# Patient Record
Sex: Female | Born: 1990 | Race: White | Hispanic: No | Marital: Single | Smoking: Never smoker
Health system: Southern US, Community
[De-identification: ages and names within clinical notes are randomized; demographics above are authoritative.]

## PROBLEM LIST (undated history)

## (undated) DIAGNOSIS — N83209 Unspecified ovarian cyst, unspecified side: Secondary | ICD-10-CM

## (undated) DIAGNOSIS — E039 Hypothyroidism, unspecified: Secondary | ICD-10-CM

## (undated) DIAGNOSIS — E119 Type 2 diabetes mellitus without complications: Secondary | ICD-10-CM

## (undated) DIAGNOSIS — I10 Essential (primary) hypertension: Secondary | ICD-10-CM

## (undated) HISTORY — PX: LAPAROSCOPIC OVARIAN CYSTECTOMY: SUR786

## (undated) HISTORY — DX: Essential (primary) hypertension: I10

## (undated) HISTORY — DX: Unspecified ovarian cyst, unspecified side: N83.209

## (undated) HISTORY — DX: Type 2 diabetes mellitus without complications: E11.9

## (undated) HISTORY — DX: Hypothyroidism, unspecified: E03.9

---
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
## 2015-08-19 ENCOUNTER — Encounter: Payer: Self-pay | Admitting: Family Medicine

## 2015-08-19 ENCOUNTER — Ambulatory Visit (INDEPENDENT_AMBULATORY_CARE_PROVIDER_SITE_OTHER): Payer: BLUE CROSS/BLUE SHIELD | Admitting: Family Medicine

## 2015-08-19 VITALS — BP 133/87 | HR 70 | Temp 98.2°F | Ht 64.0 in | Wt 280.6 lb

## 2015-08-19 DIAGNOSIS — R319 Hematuria, unspecified: Secondary | ICD-10-CM

## 2015-08-19 DIAGNOSIS — N926 Irregular menstruation, unspecified: Secondary | ICD-10-CM | POA: Diagnosis not present

## 2015-08-19 DIAGNOSIS — E119 Type 2 diabetes mellitus without complications: Secondary | ICD-10-CM | POA: Diagnosis not present

## 2015-08-19 DIAGNOSIS — N83209 Unspecified ovarian cyst, unspecified side: Secondary | ICD-10-CM | POA: Insufficient documentation

## 2015-08-19 DIAGNOSIS — I10 Essential (primary) hypertension: Secondary | ICD-10-CM | POA: Diagnosis not present

## 2015-08-19 DIAGNOSIS — E1122 Type 2 diabetes mellitus with diabetic chronic kidney disease: Secondary | ICD-10-CM

## 2015-08-19 DIAGNOSIS — I129 Hypertensive chronic kidney disease with stage 1 through stage 4 chronic kidney disease, or unspecified chronic kidney disease: Secondary | ICD-10-CM | POA: Insufficient documentation

## 2015-08-19 DIAGNOSIS — Z13220 Encounter for screening for lipoid disorders: Secondary | ICD-10-CM | POA: Diagnosis not present

## 2015-08-19 MED ORDER — METFORMIN HCL ER (MOD) 500 MG PO TB24: ORAL_TABLET | ORAL | Status: DC

## 2015-08-19 NOTE — Assessment & Plan Note (Signed)
Sounds like PCOS from her description. Will obtain records from previous provider. Will get her into GYN given her abnormal menstrual cycles and previous need for surgery. Will likely need OCP. Referral to GYN made today.

## 2015-08-19 NOTE — Patient Instructions (Signed)
Diabetes and Exercise Exercising regularly is important. It is not just about losing weight. It has many health benefits, such as:  Improving your overall fitness, flexibility, and endurance.  Increasing your bone density.  Helping with weight control.  Decreasing your body fat.  Increasing your muscle strength.  Reducing stress and tension.  Improving your overall health. People with diabetes who exercise gain additional benefits because exercise:  Reduces appetite.  Improves the body's use of blood sugar (glucose).  Helps lower or control blood glucose.  Decreases blood pressure.  Helps control blood lipids (such as cholesterol and triglycerides).  Improves the body's use of the hormone insulin by:  Increasing the body's insulin sensitivity.  Reducing the body's insulin needs.  Decreases the risk for heart disease because exercising:  Lowers cholesterol and triglycerides levels.  Increases the levels of good cholesterol (such as high-density lipoproteins [HDL]) in the body.  Lowers blood glucose levels. YOUR ACTIVITY PLAN  Choose an activity that you enjoy, and set realistic goals. To exercise safely, you should begin practicing any new physical activity slowly, and gradually increase the intensity of the exercise over time. Your health care provider or diabetes educator can help create an activity plan that works for you. General recommendations include:  Encouraging children to engage in at least 60 minutes of physical activity each day.  Stretching and performing strength training exercises, such as yoga or weight lifting, at least 2 times per week.  Performing a total of at least 150 minutes of moderate-intensity exercise each week, such as brisk walking or water aerobics.  Exercising at least 3 days per week, making sure you allow no more than 2 consecutive days to pass without exercising.  Avoiding long periods of inactivity (90 minutes or more). When you  have to spend an extended period of time sitting down, take frequent breaks to walk or stretch. RECOMMENDATIONS FOR EXERCISING WITH TYPE 1 OR TYPE 2 DIABETES   Check your blood glucose before exercising. If blood glucose levels are greater than 240 mg/dL, check for urine ketones. Do not exercise if ketones are present.  Avoid injecting insulin into areas of the body that are going to be exercised. For example, avoid injecting insulin into:  The arms when playing tennis.  The legs when jogging.  Keep a record of:  Food intake before and after you exercise.  Expected peak times of insulin action.  Blood glucose levels before and after you exercise.  The type and amount of exercise you have done.  Review your records with your health care provider. Your health care provider will help you to develop guidelines for adjusting food intake and insulin amounts before and after exercising.  If you take insulin or oral hypoglycemic agents, watch for signs and symptoms of hypoglycemia. They include:  Dizziness.  Shaking.  Sweating.  Chills.  Confusion.  Drink plenty of water while you exercise to prevent dehydration or heat stroke. Body water is lost during exercise and must be replaced.  Talk to your health care provider before starting an exercise program to make sure it is safe for you. Remember, almost any type of activity is better than none.   This information is not intended to replace advice given to you by your health care provider. Make sure you discuss any questions you have with your health care provider.   Document Released: 09/18/2003 Document Revised: 11/12/2014 Document Reviewed: 12/05/2012 Elsevier Interactive Patient Education 2016 [employer] Diabetes Mellitus and Food It is important for   you to manage your blood sugar (glucose) level. Your blood glucose level can be greatly affected by what you eat. Eating healthier foods in the appropriate amounts throughout  the day at about the same time each day will help you control your blood glucose level. It can also help slow or prevent worsening of your diabetes mellitus. Healthy eating may even help you improve the level of your blood pressure and reach or maintain a healthy weight.  General recommendations for healthful eating and cooking habits include:  Eating meals and snacks regularly. Avoid going long periods of time without eating to lose weight.  Eating a diet that consists mainly of plant-based foods, such as fruits, vegetables, nuts, legumes, and whole grains.  Using low-heat cooking methods, such as baking, instead of high-heat cooking methods, such as deep frying. Work with your dietitian to make sure you understand how to use the Nutrition Facts information on food labels. HOW CAN FOOD AFFECT ME? Carbohydrates Carbohydrates affect your blood glucose level more than any other type of food. Your dietitian will help you determine how many carbohydrates to eat at each meal and teach you how to count carbohydrates. Counting carbohydrates is important to keep your blood glucose at a healthy level, especially if you are using insulin or taking certain medicines for diabetes mellitus. Alcohol Alcohol can cause sudden decreases in blood glucose (hypoglycemia), especially if you use insulin or take certain medicines for diabetes mellitus. Hypoglycemia can be a life-threatening condition. Symptoms of hypoglycemia (sleepiness, dizziness, and disorientation) are similar to symptoms of having too much alcohol.  If your health care provider has given you approval to drink alcohol, do so in moderation and use the following guidelines:  Women should not have more than one drink per day, and men should not have more than two drinks per day. One drink is equal to:  12 oz of beer.  5 oz of wine.  1 oz of hard liquor.  Do not drink on an empty stomach.  Keep yourself hydrated. Have water, diet soda, or  unsweetened iced tea.  Regular soda, juice, and other mixers might contain a lot of carbohydrates and should be counted. WHAT FOODS ARE NOT RECOMMENDED? As you make food choices, it is important to remember that all foods are not the same. Some foods have fewer nutrients per serving than other foods, even though they might have the same number of calories or carbohydrates. It is difficult to get your body what it needs when you eat foods with fewer nutrients. Examples of foods that you should avoid that are high in calories and carbohydrates but low in nutrients include:  Trans fats (most processed foods list trans fats on the Nutrition Facts label).  Regular soda.  Juice.  Candy.  Sweets, such as cake, pie, doughnuts, and cookies.  Fried foods. WHAT FOODS CAN I EAT? Eat nutrient-rich foods, which will nourish your body and keep you healthy. The food you should eat also will depend on several factors, including:  The calories you need.  The medicines you take.  Your weight.  Your blood glucose level.  Your blood pressure level.  Your cholesterol level. You should eat a variety of foods, including:  Protein.  Lean cuts of meat.  Proteins low in saturated fats, such as fish, egg whites, and beans. Avoid processed meats.  Fruits and vegetables.  Fruits and vegetables that may help control blood glucose levels, such as apples, mangoes, and yams.  Dairy products.  Choose fat-free   or low-fat dairy products, such as milk, yogurt, and cheese.  Grains, bread, pasta, and rice.  Choose whole grain products, such as multigrain bread, whole oats, and brown rice. These foods may help control blood pressure.  Fats.  Foods containing healthful fats, such as nuts, avocado, olive oil, canola oil, and fish. DOES EVERYONE WITH DIABETES MELLITUS HAVE THE SAME MEAL PLAN? Because every person with diabetes mellitus is different, there is not one meal plan that works for everyone. It  is very important that you meet with a dietitian who will help you create a meal plan that is just right for you.   This information is not intended to replace advice given to you by your health care provider. Make sure you discuss any questions you have with your health care provider.   Document Released: 03/25/2005 Document Revised: 07/19/2014 Document Reviewed: 05/25/2013 Elsevier Interactive Patient Education 2016 Elsevier Inc.  

## 2015-08-19 NOTE — Assessment & Plan Note (Signed)
Will get her into GYN given her abnormal menstrual cycles and previous need for surgery. Will likely need OCP. Referral to GYN made today.

## 2015-08-19 NOTE — Assessment & Plan Note (Signed)
Normal BP today. Continue to monitor. Continue to work on diet and exercise. Recheck in 1 month at physical.

## 2015-08-19 NOTE — Assessment & Plan Note (Signed)
Has lost weight, but doesn't know how much. Continue to watch diet and work on exercise. Referral to lifestyle center made today.

## 2015-08-19 NOTE — Progress Notes (Signed)
BP 133/87 mmHg  Pulse 70  Temp(Src) 98.2 F (36.8 C)  Ht  (1.626 m)  Wt 280 lb 9.6 oz (127.279 kg)  BMI 48.14 kg/m2  SpO2 99%  LMP  (LMP Unknown)   Subjective:    Patient ID: Maria Reyes, female    DOB: 11/25/1990, 25 y.o.   MRN: [id number]  HPI: Maria Reyes is a 25 y.o. female was seeing Elnita Maxwell in the past. Had been seeing GYN.   Chief Complaint  Patient presents with  . Establish Care  . Menstrual Problem    Extremely Irregular Menstural Cycles  . Foot Swelling    Left foot x's two months  . Medication Problem    Patient was tested for diabetes and was borderline. Also had high blood pressure. Was put on medication but stopped both. Couldn't afford medication and had no health insurance.   . Other    Hasn't had papsmear in two years.    DIABETES- first diagnosed about 2-3 years ago, didn't do any classes. Was on medication, but didn't take it.  Hypoglycemic episodes:yes Polydipsia/polyuria: yes Visual disturbance: no Chest pain: no Paresthesias: yes- occasionally in her feet Glucose Monitoring: no  Accucheck frequency: 4-5 months ago.   Fasting glucose: 60-80 Taking Insulin?: no Blood Pressure Monitoring: daily Retinal Examination: Not up to Date Foot Exam: Not up to Date Diabetic Education: Not Completed Pneumovax: Not up to Date Influenza: Up to Date Aspirin: no  HYPERTENSION- has been working on losing weight Hypertension status: better  Satisfied with current treatment? no Duration of hypertension: chronic BP monitoring frequency:  daily BP range: 120s-130s Previous BP meds: Aspirin: no Recurrent headaches: no Visual changes: no Palpitations: no Dyspnea: no Chest pain: no Lower extremity edema: yes Dizzy/lightheaded: no  ABNORMAL MENSTRUAL PERIODS- will bleed for about a day, then will go 4-5 months with dark old blood, leaving odor. Not sure if she has been diagnosed with PCOS in the past. Was on OCPs in the past for the cysts.   Duration:  about a year Average interval between menses: up to 7 months to a year Length of menses: 4-77months Flow: goes back and forth between heavy and spotting Dysmenorrhea: yes Intermenstrual bleeding:yes Postcoital bleeding: yes Contraception: none Menarche at age: 64 Sexual activity: In a Monogamous Relationship History of sexually transmitted diseases: no History GYN procedures: yes, laproscopy for ovarian cysts and endometriosis Abnormal pap smears: no   Dyspareunia: no Vaginal discharge:no Abdominal pain: yes Galactorrhea: no Hirsuitism: yes Frequent bruising/mucosal bleeding: no Double vision:no Hot flashes: yes  L foot has been swelling off and on for months or so  Active Ambulatory Problems    Diagnosis Date Noted  . DM (diabetes mellitus), type 2 with renal complications (HCC)   . Hypertension   . Ovarian cyst   . Morbid obesity (HCC) 08/19/2015  . Abnormal menstrual cycle 08/19/2015   Resolved Ambulatory Problems    Diagnosis Date Noted  . No Resolved Ambulatory Problems   Past Medical History  Diagnosis Date  . Diabetes mellitus without complication [facility])    Past Surgical History  Procedure Laterality Date  . Laparoscopic ovarian cystectomy     No Known Allergies  Family History  Problem Relation Age of Onset  . Endometriosis Mother   . Ovarian cysts Mother   . Cancer Mother     Breast and Ovarian  . Diabetes Maternal Grandmother   . Hypertension Maternal Grandmother   . Diabetes Maternal Grandfather    Social History  Social History  . Marital Status: Single    Spouse Name: N/A  . Number of Children: N/A  . Years of Education: N/A   Social History Main Topics  . Smoking status: Never Smoker   . Smokeless tobacco: Never Used  . Alcohol Use: Yes     Comment: Occasionally  . Drug Use: No  . Sexual Activity: Yes   Other Topics Concern  . None   Social History Narrative  . None   Review of Systems  Constitutional: Negative.   HENT:  Negative.   Respiratory: Negative.   Cardiovascular: Negative.   Musculoskeletal: Negative.   Psychiatric/Behavioral: Negative.     Per HPI unless specifically indicated above     Objective:    BP 133/87 mmHg  Pulse 70  Temp(Src) 98.2 F (36.8 C)  Ht 5\' 4"  (1.626 m)  Wt 280 lb 9.6 oz (127.279 kg)  BMI 48.14 kg/m2  SpO2 99%  LMP  (LMP Unknown)  Wt Readings from Last 3 Encounters:  08/19/15 280 lb 9.6 oz (127.279 kg)    Physical Exam  Constitutional: She is oriented to person, place, and time. She appears well-developed and well-nourished. No distress.  HENT:  Head: Normocephalic and atraumatic.  Right Ear: Hearing and external ear normal.  Left Ear: Hearing and external ear normal.  Nose: Nose normal.  Mouth/Throat: Oropharynx is clear and moist. No oropharyngeal exudate.  Eyes: Conjunctivae, EOM and lids are normal. Pupils are equal, round, and reactive to light. Right eye exhibits no discharge. Left eye exhibits no discharge. No scleral icterus.  Neck: Normal range of motion. Neck supple. No JVD present. No tracheal deviation present. No thyromegaly present.  Cardiovascular: Normal rate, regular rhythm, normal heart sounds and intact distal pulses.  Exam reveals no gallop and no friction rub.   No murmur heard. Pulmonary/Chest: Effort normal and breath sounds normal. No stridor. No respiratory distress. She has no wheezes. She has no rales. She exhibits no tenderness.  Musculoskeletal: Normal range of motion.  Lymphadenopathy:    She has no cervical adenopathy.  Neurological: She is alert and oriented to person, place, and time.  Skin: Skin is warm, dry and intact. No rash noted. She is not diaphoretic. No erythema. No pallor.  Psychiatric: She has a normal mood and affect. Her speech is normal and behavior is normal. Judgment and thought content normal. Cognition and memory are normal.  Nursing note and vitals reviewed.      Assessment & Plan:   Problem List Items  Addressed This Visit      Cardiovascular and Mediastinum   Hypertension    Normal BP today. Continue to monitor. Continue to work on diet and exercise. Recheck in 1 month at physical.       Relevant Orders   CBC with Differential/Platelet   Comprehensive metabolic panel   Microalbumin, Urine Waived (Completed)   TSH   UA/M w/rflx Culture, Routine (Completed)     Endocrine   DM (diabetes mellitus), type 2 with renal complications (HCC) - Primary    Not under good control. Has been off medications. A1c 8.9 today. Will start metformin and check in in 1 month. Work on diet and exercise. Continue to lose weight. Rx for glucometer given today. Referral to eye doctor and life style center made today. Continue to monitor closely.      Relevant Medications   metformin (GLUMETZA) 500 MG (MOD) 24 hr tablet     Genitourinary   Ovarian cyst  Sounds like PCOS from her description. Will obtain records from previous provider. Will get her into GYN given her abnormal menstrual cycles and previous need for surgery. Will likely need OCP. Referral to GYN made today.        Other   Morbid obesity (HCC)    Has lost weight, but doesn't know how much. Continue to watch diet and work on exercise. Referral to lifestyle center made today.       Relevant Medications   metformin (GLUMETZA) 500 MG (MOD) 24 hr tablet   Other Relevant Orders   CBC with Differential/Platelet   Comprehensive metabolic panel   TSH   UA/M w/rflx Culture, Routine (Completed)   Abnormal menstrual cycle    Will get her into GYN given her abnormal menstrual cycles and previous need for surgery. Will likely need OCP. Referral to GYN made today.      Relevant Orders   Ambulatory referral to Gynecology    Other Visit Diagnoses    Screening for cholesterol level        Drawn today. Await results. Treat as needed.     Relevant Orders    Lipid Panel w/o Chol/HDL Ratio    UA/M w/rflx Culture, Routine (Completed)         Follow up plan: Return in about 4 weeks (around 09/16/2015) for follow up on DM and BP/ physical.

## 2015-08-19 NOTE — Assessment & Plan Note (Signed)
Not under good control. Has been off medications. A1c 8.9 today. Will start metformin and check in in 1 month. Work on diet and exercise. Continue to lose weight. Rx for glucometer given today. Referral to eye doctor and life style center made today. Continue to monitor closely.

## 2015-08-20 ENCOUNTER — Encounter: Payer: Self-pay | Admitting: Family Medicine

## 2015-08-20 ENCOUNTER — Telehealth: Payer: Self-pay | Admitting: Family Medicine

## 2015-08-20 DIAGNOSIS — E039 Hypothyroidism, unspecified: Secondary | ICD-10-CM | POA: Insufficient documentation

## 2015-08-20 DIAGNOSIS — R7989 Other specified abnormal findings of blood chemistry: Secondary | ICD-10-CM

## 2015-08-20 LAB — CBC WITH DIFFERENTIAL/PLATELET
BASOS ABS: 0 10*3/uL (ref 0.0–0.2)
Basos: 0 %
EOS (ABSOLUTE): 0.1 10*3/uL (ref 0.0–0.4)
Eos: 1 %
Hematocrit: 42.1 % (ref 34.0–46.6)
Hemoglobin: 13.2 g/dL (ref 11.1–15.9)
Immature Grans (Abs): 0 10*3/uL (ref 0.0–0.1)
Immature Granulocytes: 0 %
Lymphocytes Absolute: 3.7 10*3/uL — ABNORMAL HIGH (ref 0.7–3.1)
Lymphs: 35 %
MCH: 25.2 pg — AB (ref 26.6–33.0)
MCHC: 31.4 g/dL — AB (ref 31.5–35.7)
MCV: 80 fL (ref 79–97)
MONOS ABS: 0.4 10*3/uL (ref 0.1–0.9)
Monocytes: 4 %
NEUTROS ABS: 6.4 10*3/uL (ref 1.4–7.0)
Neutrophils: 60 %
PLATELETS: 346 10*3/uL (ref 150–379)
RBC: 5.24 x10E6/uL (ref 3.77–5.28)
RDW: 14.5 % (ref 12.3–15.4)
WBC: 10.6 10*3/uL (ref 3.4–10.8)

## 2015-08-20 LAB — COMPREHENSIVE METABOLIC PANEL
A/G RATIO: 1.4 (ref 1.1–2.5)
ALT: 46 IU/L — ABNORMAL HIGH (ref 0–32)
AST: 25 IU/L (ref 0–40)
Albumin: 4.3 g/dL (ref 3.5–5.5)
Alkaline Phosphatase: 104 IU/L (ref 39–117)
BUN/Creatinine Ratio: 18 (ref 8–20)
BUN: 9 mg/dL (ref 6–20)
Bilirubin Total: 0.2 mg/dL (ref 0.0–1.2)
CO2: 21 mmol/L (ref 18–29)
Calcium: 9.6 mg/dL (ref 8.7–10.2)
Chloride: 99 mmol/L (ref 96–106)
Creatinine, Ser: 0.51 mg/dL — ABNORMAL LOW (ref 0.57–1.00)
GFR calc non Af Amer: 137 mL/min/{1.73_m2} (ref >59)
GFR, EST AFRICAN AMERICAN: 158 mL/min/{1.73_m2} (ref >59)
Globulin, Total: 3 g/dL (ref 1.5–4.5)
Glucose: 162 mg/dL — ABNORMAL HIGH (ref 65–99)
POTASSIUM: 4.5 mmol/L (ref 3.5–5.2)
Sodium: 139 mmol/L (ref 134–144)
TOTAL PROTEIN: 7.3 g/dL (ref 6.0–8.5)

## 2015-08-20 LAB — LIPID PANEL W/O CHOL/HDL RATIO
Cholesterol, Total: 176 mg/dL (ref 100–199)
HDL: 40 mg/dL (ref >39)
LDL Calculated: 114 mg/dL — ABNORMAL HIGH (ref 0–99)
Triglycerides: 112 mg/dL (ref 0–149)
VLDL Cholesterol Cal: 22 mg/dL (ref 5–40)

## 2015-08-20 LAB — TSH: TSH: 5.01 u[IU]/mL — ABNORMAL HIGH (ref 0.450–4.500)

## 2015-08-20 NOTE — Telephone Encounter (Signed)
Called patient to let her know that her thyroid was off slightly but that the rest of her labs look good. Will recheck thyroid in 1 month. Letter generated also telling her this.

## 2015-08-21 ENCOUNTER — Telehealth: Payer: Self-pay | Admitting: Family Medicine

## 2015-08-21 MED ORDER — CIPROFLOXACIN HCL 500 MG PO TABS: 500.0000 mg | ORAL_TABLET | Freq: Two times a day (BID) | ORAL | Status: DC

## 2015-08-21 NOTE — Telephone Encounter (Signed)
Patient returned my call, let her know medication was sent to the pharmacy.

## 2015-08-21 NOTE — Telephone Encounter (Signed)
An you please let her know her urine grew out bacteria and I've sent over an antibiotic for her? Thanks!

## 2015-08-21 NOTE — Telephone Encounter (Signed)
Left message for patient to return my call.

## 2015-08-25 LAB — MICROSCOPIC EXAMINATION

## 2015-08-25 LAB — UA/M W/RFLX CULTURE, ROUTINE
BILIRUBIN UA: NEGATIVE
GLUCOSE, UA: NEGATIVE
Ketones, UA: NEGATIVE
Nitrite, UA: NEGATIVE
Protein, UA: NEGATIVE
Specific Gravity, UA: 1.025 (ref 1.005–1.030)
Urobilinogen, Ur: 0.2 mg/dL (ref 0.2–1.0)
pH, UA: 5.5 (ref 5.0–7.5)

## 2015-08-25 LAB — URINE CULTURE, REFLEX

## 2015-08-25 LAB — MICROALBUMIN, URINE WAIVED
CREATININE, URINE WAIVED: 200 mg/dL (ref 10–300)
MICROALB, UR WAIVED: 80 mg/L — AB (ref 0–19)

## 2015-08-25 LAB — BAYER DCA HB A1C WAIVED: HB A1C (BAYER DCA - WAIVED): 8.9 % — ABNORMAL HIGH (ref <7.0)

## 2015-08-26 ENCOUNTER — Telehealth: Payer: Self-pay

## 2015-08-26 NOTE — Telephone Encounter (Signed)
Patients prior authroization for MetFormin was denied.   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

## 2015-08-26 NOTE — Telephone Encounter (Signed)
Called pharmacy fixed the prescription

## 2015-08-26 NOTE — Telephone Encounter (Signed)
OK to call in glucophage XR into her pharmacy. Doesn't need glumetza. Any extended release metformin is fine. Whatever they'll cover.

## 2015-08-29 ENCOUNTER — Ambulatory Visit: Payer: Self-pay | Admitting: *Deleted

## 2015-09-19 ENCOUNTER — Encounter: Payer: BLUE CROSS/BLUE SHIELD | Admitting: Family Medicine

## 2015-09-24 ENCOUNTER — Encounter: Payer: BLUE CROSS/BLUE SHIELD | Admitting: Obstetrics and Gynecology

## 2015-10-02 ENCOUNTER — Ambulatory Visit (INDEPENDENT_AMBULATORY_CARE_PROVIDER_SITE_OTHER): Payer: BLUE CROSS/BLUE SHIELD

## 2015-10-02 ENCOUNTER — Ambulatory Visit (INDEPENDENT_AMBULATORY_CARE_PROVIDER_SITE_OTHER): Payer: BLUE CROSS/BLUE SHIELD | Admitting: Family Medicine

## 2015-10-02 ENCOUNTER — Ambulatory Visit (INDEPENDENT_AMBULATORY_CARE_PROVIDER_SITE_OTHER): Payer: BLUE CROSS/BLUE SHIELD | Admitting: Obstetrics and Gynecology

## 2015-10-02 ENCOUNTER — Encounter: Payer: Self-pay | Admitting: Obstetrics and Gynecology

## 2015-10-02 ENCOUNTER — Encounter: Payer: Self-pay | Admitting: Family Medicine

## 2015-10-02 VITALS — BP 135/80 | HR 92 | Ht 64.0 in | Wt 281.0 lb

## 2015-10-02 VITALS — BP 126/76 | HR 67 | Temp 98.1°F | Ht 63.5 in | Wt 278.0 lb

## 2015-10-02 DIAGNOSIS — R7989 Other specified abnormal findings of blood chemistry: Secondary | ICD-10-CM

## 2015-10-02 DIAGNOSIS — E1122 Type 2 diabetes mellitus with diabetic chronic kidney disease: Secondary | ICD-10-CM | POA: Diagnosis not present

## 2015-10-02 DIAGNOSIS — N83201 Unspecified ovarian cyst, right side: Secondary | ICD-10-CM | POA: Diagnosis not present

## 2015-10-02 DIAGNOSIS — N926 Irregular menstruation, unspecified: Secondary | ICD-10-CM

## 2015-10-02 DIAGNOSIS — F32A Depression, unspecified: Secondary | ICD-10-CM | POA: Insufficient documentation

## 2015-10-02 DIAGNOSIS — Z113 Encounter for screening for infections with a predominantly sexual mode of transmission: Secondary | ICD-10-CM | POA: Diagnosis not present

## 2015-10-02 DIAGNOSIS — Z23 Encounter for immunization: Secondary | ICD-10-CM

## 2015-10-02 DIAGNOSIS — R102 Pelvic and perineal pain: Secondary | ICD-10-CM | POA: Diagnosis not present

## 2015-10-02 DIAGNOSIS — E119 Type 2 diabetes mellitus without complications: Secondary | ICD-10-CM | POA: Diagnosis not present

## 2015-10-02 DIAGNOSIS — N83202 Unspecified ovarian cyst, left side: Secondary | ICD-10-CM

## 2015-10-02 DIAGNOSIS — F329 Major depressive disorder, single episode, unspecified: Secondary | ICD-10-CM | POA: Diagnosis not present

## 2015-10-02 DIAGNOSIS — Z0000 Encounter for general adult medical examination without abnormal findings: Secondary | ICD-10-CM | POA: Diagnosis not present

## 2015-10-02 LAB — GLUCOSE HEMOCUE WAIVED: Glu Hemocue Waived: 155 mg/dL — ABNORMAL HIGH (ref 65–99)

## 2015-10-02 MED ORDER — IBUPROFEN 800 MG PO TABS: 800.0000 mg | ORAL_TABLET | Freq: Three times a day (TID) | ORAL | Status: DC | PRN

## 2015-10-02 MED ORDER — MEDROXYPROGESTERONE ACETATE 10 MG PO TABS: 10.0000 mg | ORAL_TABLET | Freq: Every day | ORAL | Status: DC

## 2015-10-02 NOTE — Assessment & Plan Note (Signed)
Not interested in treatment at this time. Will continue to monitor and recheck next visit. Call with any concerns or with changes.

## 2015-10-02 NOTE — Progress Notes (Signed)
Subjective:     Patient ID: Maria Reyes, female   DOB: [birth year], 25 y.o.   MRN: [id number]  HPI Referred here for evaluation of irregular menses and pelvic pain. Reports irregular menses with occasionally skipping months over last year, spotting of brown blood for days to week, progressive weight gain, and lower pelvic pain intermittent. States onset menarche at age 89 with regular menses till age 25 at which time she had large ovarian cyst- surgically removed, and then took OCPs for 4 months, stopped due to elevated blood pressure. Was diagnosed with diabetes and started on metformin. 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 Is engaged and sexually active using condoms.   Review of Systems See above    Objective:   Physical Exam A&O x4  well groomed obese female in no distress Blood pressure 135/80, pulse 92, height 5\' 4"  (1.626 m), weight 281 lb (127.461 kg), last menstrual period 06/12/2015.  Ultrasound done today revealed ruptured ovarian cyst on right ovary and small cyst on left ovary, both ovaries slightly enlarged with multiple follicle and 'string of pearls' appearance. Uterus normal in size with thickened endometrium at 9mm. Scant free fluid noted in cul de sac.    Assessment:     Ovarian cyst Irregular menses Pelvic pain PCOS Morbid obesity     Plan:     Counseled on findings. Prescription given for provera challenge to start now. Labs ordered to r/o PCOS. RTC in 2 weeks to discuss labs and treatment.  Rafik Koppel EmbarrassShambley, CNM

## 2015-10-02 NOTE — Assessment & Plan Note (Signed)
Doing better on metformin, tolerating it well. Recheck A1c in 2 months.

## 2015-10-02 NOTE — Patient Instructions (Signed)
Health Maintenance, Female Adopting a healthy lifestyle and getting preventive care can go a long way to promote health and wellness. Talk with your health care provider about what schedule of regular examinations is right for you. This is a good chance for you to check in with your provider about disease prevention and staying healthy. In between checkups, there are plenty of things you can do on your own. Experts have done a lot of research about which lifestyle changes and preventive measures are most likely to keep you healthy. Ask your health care provider for more information. WEIGHT AND DIET  Eat a healthy diet  Be sure to include plenty of vegetables, fruits, low-fat dairy products, and lean protein.  Do not eat a lot of foods high in solid fats, added sugars, or salt.  Get regular exercise. This is one of the most important things you can do for your health.  Most adults should exercise for at least 150 minutes each week. The exercise should increase your heart rate and make you sweat (moderate-intensity exercise).  Most adults should also do strengthening exercises at least twice a week. This is in addition to the moderate-intensity exercise.  Maintain a healthy weight  Body mass index (BMI) is a measurement that can be used to identify possible weight problems. It estimates body fat based on height and weight. Your health care provider can help determine your BMI and help you achieve or maintain a healthy weight.  For females 20 years of age and older:   A BMI below 18.5 is considered underweight.  A BMI of 18.5 to 24.9 is normal.  A BMI of 25 to 29.9 is considered overweight.  A BMI of 30 and above is considered obese.  Watch levels of cholesterol and blood lipids  You should start having your blood tested for lipids and cholesterol at 23 years of age, then have this test every 5 years.  You may need to have your cholesterol levels checked more often if:  Your lipid  or cholesterol levels are high.  You are older than 23 years of age.  You are at high risk for heart disease.  CANCER SCREENING   Lung Cancer  Lung cancer screening is recommended for adults 55-80 years old who are at high risk for lung cancer because of a history of smoking.  A yearly low-dose CT scan of the lungs is recommended for people who:  Currently smoke.  Have quit within the past 15 years.  Have at least a 30-pack-year history of smoking. A pack year is smoking an average of one pack of cigarettes a day for 1 year.  Yearly screening should continue until it has been 15 years since you quit.  Yearly screening should stop if you develop a health problem that would prevent you from having lung cancer treatment.  Breast Cancer  Practice breast self-awareness. This means understanding how your breasts normally appear and feel.  It also means doing regular breast self-exams. Let your health care provider know about any changes, no matter how small.  If you are in your 20s or 30s, you should have a clinical breast exam (CBE) by a health care provider every 1-3 years as part of a regular health exam.  If you are 40 or older, have a CBE every year. Also consider having a breast X-ray (mammogram) every year.  If you have a family history of breast cancer, talk to your health care provider about genetic screening.  If you   are at high risk for breast cancer, talk to your health care provider about having an MRI and a mammogram every year.  Breast cancer gene (BRCA) assessment is recommended for women who have family members with BRCA-related cancers. BRCA-related cancers include:  Breast.  Ovarian.  Tubal.  Peritoneal cancers.  Results of the assessment will determine the need for genetic counseling and BRCA1 and BRCA2 testing. Cervical Cancer Your health care provider may recommend that you be screened regularly for cancer of the pelvic organs (ovaries, uterus, and  vagina). This screening involves a pelvic examination, including checking for microscopic changes to the surface of your cervix (Pap test). You may be encouraged to have this screening done every 3 years, beginning at age 21.  For women ages 30-65, health care providers may recommend pelvic exams and Pap testing every 3 years, or they may recommend the Pap and pelvic exam, combined with testing for human papilloma virus (HPV), every 5 years. Some types of HPV increase your risk of cervical cancer. Testing for HPV may also be done on women of any age with unclear Pap test results.  Other health care providers may not recommend any screening for nonpregnant women who are considered low risk for pelvic cancer and who do not have symptoms. Ask your health care provider if a screening pelvic exam is right for you.  If you have had past treatment for cervical cancer or a condition that could lead to cancer, you need Pap tests and screening for cancer for at least 20 years after your treatment. If Pap tests have been discontinued, your risk factors (such as having a new sexual partner) need to be reassessed to determine if screening should resume. Some women have medical problems that increase the chance of getting cervical cancer. In these cases, your health care provider may recommend more frequent screening and Pap tests. Colorectal Cancer  This type of cancer can be detected and often prevented.  Routine colorectal cancer screening usually begins at 23 years of age and continues through 23 years of age.  Your health care provider may recommend screening at an earlier age if you have risk factors for colon cancer.  Your health care provider may also recommend using home test kits to check for hidden blood in the stool.  A small camera at the end of a tube can be used to examine your colon directly (sigmoidoscopy or colonoscopy). This is done to check for the earliest forms of colorectal  cancer.  Routine screening usually begins at age 50.  Direct examination of the colon should be repeated every 5-10 years through 23 years of age. However, you may need to be screened more often if early forms of precancerous polyps or small growths are found. Skin Cancer  Check your skin from head to toe regularly.  Tell your health care provider about any new moles or changes in moles, especially if there is a change in a mole's shape or color.  Also tell your health care provider if you have a mole that is larger than the size of a pencil eraser.  Always use sunscreen. Apply sunscreen liberally and repeatedly throughout the day.  Protect yourself by wearing long sleeves, pants, a wide-brimmed hat, and sunglasses whenever you are outside. HEART DISEASE, DIABETES, AND HIGH BLOOD PRESSURE   High blood pressure causes heart disease and increases the risk of stroke. High blood pressure is more likely to develop in:  People who have blood pressure in the high end   of the normal range (130-139/85-89 mm Hg).  People who are overweight or obese.  People who are African American.  If you are 38-23 years of age, have your blood pressure checked every 3-5 years. If you are 61 years of age or older, have your blood pressure checked every year. You should have your blood pressure measured twice--once when you are at a hospital or clinic, and once when you are not at a hospital or clinic. Record the average of the two measurements. To check your blood pressure when you are not at a hospital or clinic, you can use:  An automated blood pressure machine at a pharmacy.  A home blood pressure monitor.  If you are between 45 years and 39 years old, ask your health care provider if you should take aspirin to prevent strokes.  Have regular diabetes screenings. This involves taking a blood sample to check your fasting blood sugar level.  If you are at a normal weight and have a low risk for diabetes,  have this test once every three years after 23 years of age.  If you are overweight and have a high risk for diabetes, consider being tested at a younger age or more often. PREVENTING INFECTION  Hepatitis B  If you have a higher risk for hepatitis B, you should be screened for this virus. You are considered at high risk for hepatitis B if:  You were born in a country where hepatitis B is common. Ask your health care provider which countries are considered high risk.  Your parents were born in a high-risk country, and you have not been immunized against hepatitis B (hepatitis B vaccine).  You have HIV or AIDS.  You use needles to inject street drugs.  You live with someone who has hepatitis B.  You have had sex with someone who has hepatitis B.  You get hemodialysis treatment.  You take certain medicines for conditions, including cancer, organ transplantation, and autoimmune conditions. Hepatitis C  Blood testing is recommended for:  Everyone born from [birth year] through 1963.  Anyone with known risk factors for hepatitis C. Sexually transmitted infections (STIs)  You should be screened for sexually transmitted infections (STIs) including gonorrhea and chlamydia if:  You are sexually active and are younger than 24 years of age.  You are older than 23 years of age and your health care provider tells you that you are at risk for this type of infection.  Your sexual activity has changed since you were last screened and you are at an increased risk for chlamydia or gonorrhea. Ask your health care provider if you are at risk.  If you do not have HIV, but are at risk, it may be recommended that you take a prescription medicine daily to prevent HIV infection. This is called pre-exposure prophylaxis (PrEP). You are considered at risk if:  You are sexually active and do not regularly use condoms or know the HIV status of your partner(s).  You take drugs by injection.  You are sexually  active with a partner who has HIV. Talk with your health care provider about whether you are at high risk of being infected with HIV. If you choose to begin PrEP, you should first be tested for HIV. You should then be tested every 3 months for as long as you are taking PrEP.  PREGNANCY   If you are premenopausal and you may become pregnant, ask your health care provider about preconception counseling.  If you may  PrEP). You are considered at risk if:    You are sexually active and do not regularly use condoms or know the HIV status of your partner(s).    You take drugs by injection.    You are sexually  active with a partner who has HIV.  Talk with your health care provider about whether you are at high risk of being infected with HIV. If you choose to begin PrEP, you should first be tested for HIV. You should then be tested every 3 months for as long as you are taking PrEP.   PREGNANCY   · If you are premenopausal and you may become pregnant, ask your health care provider about preconception counseling.  · If you may become pregnant, take 400 to 800 micrograms (mcg) of folic acid every day.  · If you want to prevent pregnancy, talk to your health care provider about birth control (contraception).  OSTEOPOROSIS AND MENOPAUSE   · Osteoporosis is a disease in which the bones lose minerals and strength with aging. This can result in serious bone fractures. Your risk for osteoporosis can be identified using a bone density scan.  · If you are 65 years of age or older, or if you are at risk for osteoporosis and fractures, ask your health care provider if you should be screened.  · Ask your health care provider whether you should take a calcium or vitamin D supplement to lower your risk for osteoporosis.  · Menopause may have certain physical symptoms and risks.  · Hormone replacement therapy may reduce some of these symptoms and risks.  Talk to your health care provider about whether hormone replacement therapy is right for you.   HOME CARE INSTRUCTIONS   · Schedule regular health, dental, and eye exams.  · Stay current with your immunizations.    · Do not use any tobacco products including cigarettes, chewing tobacco, or electronic cigarettes.  · If you are pregnant, do not drink alcohol.  · If you are breastfeeding, limit how much and how often you drink alcohol.  · Limit alcohol intake to no more than 1 drink per day for nonpregnant women. One drink equals 12 ounces of beer, 5 ounces of wine, or 1½ ounces of hard liquor.  · Do not use street drugs.  · Do not share needles.  · Ask your health care provider for help if  you need support or information about quitting drugs.  · Tell your health care provider if you often feel depressed.  · Tell your health care provider if you have ever been abused or do not feel safe at home.     This information is not intended to replace advice given to you by your health care provider. Make sure you discuss any questions you have with your health care provider.     Document Released: 01/11/2011 Document Revised: 07/19/2014 Document Reviewed: 05/30/2013  [employer]vier Interactive Patient Education ©2016 [employer]  Pneumococcal Vaccine, Polyvalent solution for injection  What is this medicine?  PNEUMOCOCCAL VACCINE, POLYVALENT (NEU mo KOK al vak SEEN, pol ee VEY luhnt) is a vaccine to prevent pneumococcus bacteria infection. These bacteria are a major cause of ear infections, Strep throat infections, and serious pneumonia, meningitis, or blood infections worldwide. These vaccines help the body to produce antibodies (protective substances) that help your body defend against these bacteria. This vaccine is recommended for people 2 years of age and older with health problems. It is also recommended for all adults over 50   years old. This vaccine will not treat an infection.  This medicine may be used for other purposes; ask your health care provider or pharmacist if you have questions.  What should I tell my health care provider before I take this medicine?  They need to know if you have any of these conditions:  -bleeding problems  -bone marrow or organ transplant  -cancer, Hodgkin's disease  -fever  -infection  -immune system problems  -low platelet count in the blood  -seizures  -an unusual or allergic reaction to pneumococcal vaccine, diphtheria toxoid, other vaccines, latex, other medicines, foods, dyes, or preservatives  -pregnant or trying to get pregnant  -breast-feeding  How should I use this medicine?  This vaccine is for injection into a muscle or under the skin. It is given by a health care  professional.  A copy of Vaccine Information Statements will be given before each vaccination. Read this sheet carefully each time. The sheet may change frequently.  Talk to your pediatrician regarding the use of this medicine in children. While this drug may be prescribed for children as young as 2 years of age for selected conditions, precautions do apply.  Overdosage: If you think you have taken too much of this medicine contact a poison control center or emergency room at once.  NOTE: This medicine is only for you. Do not share this medicine with others.  What if I miss a dose?  It is important not to miss your dose. Call your doctor or health care professional if you are unable to keep an appointment.  What may interact with this medicine?  -medicines for cancer chemotherapy  -medicines that suppress your immune function  -medicines that treat or prevent blood clots like warfarin, enoxaparin, and dalteparin  -steroid medicines like prednisone or cortisone  This list may not describe all possible interactions. Give your health care provider a list of all the medicines, herbs, non-prescription drugs, or dietary supplements you use. Also tell them if you smoke, drink alcohol, or use illegal drugs. Some items may interact with your medicine.  What should I watch for while using this medicine?  Mild fever and pain should go away in 3 days or less. Report any unusual symptoms to your doctor or health care professional.  What side effects may I notice from receiving this medicine?  Side effects that you should report to your doctor or health care professional as soon as possible:  -allergic reactions like skin rash, itching or hives, swelling of the face, lips, or tongue  -breathing problems  -confused  -fever over 102 degrees F  -pain, tingling, numbness in the hands or feet  -seizures  -unusual bleeding or bruising  -unusual muscle weakness  Side effects that usually do not require medical attention (report to your  doctor or health care professional if they continue or are bothersome):  -aches and pains  -diarrhea  -fever of 102 degrees F or less  -headache  -irritable  -loss of appetite  -pain, tender at site where injected  -trouble sleeping  This list may not describe all possible side effects. Call your doctor for medical advice about side effects. You may report side effects to FDA at 1-800-FDA-1088.  Where should I keep my medicine?  This does not apply. This vaccine is given in a clinic, pharmacy, doctor's office, or other health care setting and will not be stored at home.  NOTE: This sheet is a summary. It may not cover all possible information. If

## 2015-10-02 NOTE — Assessment & Plan Note (Signed)
Rechecking again today. Await results.

## 2015-10-02 NOTE — Patient Instructions (Signed)
Ovarian Cyst An ovarian cyst is a fluid-filled sac that forms on an ovary. The ovaries are small organs that produce eggs in women. Various types of cysts can form on the ovaries. Most are not cancerous. Many do not cause problems, and they often go away on their own. Some may cause symptoms and require treatment. Common types of ovarian cysts include:  Functional cysts--These cysts may occur every month during the menstrual cycle. This is normal. The cysts usually go away with the next menstrual cycle if the woman does not get pregnant. Usually, there are no symptoms with a functional cyst.  Endometrioma cysts--These cysts form from the tissue that lines the uterus. They are also called "chocolate cysts" because they become filled with blood that turns brown. This type of cyst can cause pain in the lower abdomen during intercourse and with your menstrual period.  Cystadenoma cysts--This type develops from the cells on the outside of the ovary. These cysts can get very big and cause lower abdomen pain and pain with intercourse. This type of cyst can twist on itself, cut off its blood supply, and cause severe pain. It can also easily rupture and cause a lot of pain.  Dermoid cysts--This type of cyst is sometimes found in both ovaries. These cysts may contain different kinds of body tissue, such as skin, teeth, hair, or cartilage. They usually do not cause symptoms unless they get very big.  Theca lutein cysts--These cysts occur when too much of a certain hormone (human chorionic gonadotropin) is produced and overstimulates the ovaries to produce an egg. This is most common after procedures used to assist with the conception of a baby (in vitro fertilization). CAUSES   Fertility drugs can cause a condition in which multiple large cysts are formed on the ovaries. This is called ovarian hyperstimulation syndrome.  A condition called polycystic ovary syndrome can cause hormonal imbalances that can lead to  nonfunctional ovarian cysts. SIGNS AND SYMPTOMS  Many ovarian cysts do not cause symptoms. If symptoms are present, they may include:  Pelvic pain or pressure.  Pain in the lower abdomen.  Pain during sexual intercourse.  Increasing girth (swelling) of the abdomen.  Abnormal menstrual periods.  Increasing pain with menstrual periods.  Stopping having menstrual periods without being pregnant. DIAGNOSIS  These cysts are commonly found during a routine or annual pelvic exam. Tests may be ordered to find out more about the cyst. These tests may include:  Ultrasound.  X-ray of the pelvis.  CT scan.  MRI.  Blood tests. TREATMENT  Many ovarian cysts go away on their own without treatment. Your health care provider may want to check your cyst regularly for 2-3 months to see if it changes. For women in menopause, it is particularly important to monitor a cyst closely because of the higher rate of ovarian cancer in menopausal women. When treatment is needed, it may include any of the following:  A procedure to drain the cyst (aspiration). This may be done using a long needle and ultrasound. It can also be done through a laparoscopic procedure. This involves using a thin, lighted tube with a tiny camera on the end (laparoscope) inserted through a small incision.  Surgery to remove the whole cyst. This may be done using laparoscopic surgery or an open surgery involving a larger incision in the lower abdomen.  Hormone treatment or birth control pills. These methods are sometimes used to help dissolve a cyst. HOME CARE INSTRUCTIONS   Only take over-the-counter   or prescription medicines as directed by your health care provider.  Follow up with your health care provider as directed.  Get regular pelvic exams and Pap tests. SEEK MEDICAL CARE IF:   Your periods are late, irregular, or painful, or they stop.  Your pelvic pain or abdominal pain does not go away.  Your abdomen becomes  larger or swollen.  You have pressure on your bladder or trouble emptying your bladder completely.  You have pain during sexual intercourse.  You have feelings of fullness, pressure, or discomfort in your stomach.  You lose weight for no apparent reason.  You feel generally ill.  You become constipated.  You lose your appetite.  You develop acne.  You have an increase in body and facial hair.  You are gaining weight, without changing your exercise and eating habits.  You think you are pregnant. SEEK IMMEDIATE MEDICAL CARE IF:   You have increasing abdominal pain.  You feel sick to your stomach (nauseous), and you throw up (vomit).  You develop a fever that comes on suddenly.  You have abdominal pain during a bowel movement.  Your menstrual periods become heavier than usual. MAKE SURE YOU:  Understand these instructions.  Will watch your condition.  Will get help right away if you are not doing well or get worse.   This information is not intended to replace advice given to you by your health care provider. Make sure you discuss any questions you have with your health care provider.   Document Released: 06/28/2005 Document Revised: 07/03/2013 Document Reviewed: 03/05/2013 Elsevier Interactive Patient Education 2016 Elsevier Inc. Polycystic Ovarian Syndrome Polycystic ovarian syndrome (PCOS) is a common hormonal disorder among women of reproductive age. Most women with PCOS grow many small cysts on their ovaries. PCOS can cause problems with your periods and make it difficult to get pregnant. It can also cause an increased risk of miscarriage with pregnancy. If left untreated, PCOS can lead to serious health problems, such as diabetes and heart disease. CAUSES The cause of PCOS is not fully understood, but genetics may be a factor. SIGNS AND SYMPTOMS   Infrequent or no menstrual periods.   Inability to get pregnant (infertility) because of not ovulating.    Increased growth of hair on the face, chest, stomach, back, thumbs, thighs, or toes.   Acne, oily skin, or dandruff.   Pelvic pain.   Weight gain or obesity, usually carrying extra weight around the waist.   Type 2 diabetes.   High cholesterol.   High blood pressure.   Female-pattern baldness or thinning hair.   Patches of thickened and dark brown or black skin on the neck, arms, breasts, or thighs.   Tiny excess flaps of skin (skin tags) in the armpits or neck area.   Excessive snoring and having breathing stop at times while asleep (sleep apnea).   Deepening of the voice.   Gestational diabetes when pregnant.  DIAGNOSIS  There is no single test to diagnose PCOS.   Your health care provider will:   Take a medical history.   Perform a pelvic exam.   Have ultrasonography done.   Check your female and female hormone levels.   Measure glucose or sugar levels in the blood.   Do other blood tests.   If you are producing too many female hormones, your health care provider will make sure it is from PCOS. At the physical exam, your health care provider will want to evaluate the areas of increased hair   increased hair growth. Try to allow natural hair growth for a few days before the visit.   During a pelvic exam, the ovaries may be enlarged or swollen because of the increased number of small cysts. This can be seen more easily by using vaginal ultrasonography or screening to examine the ovaries and lining of the uterus (endometrium) for cysts. The uterine lining may become thicker if you have not been having a regular period.  TREATMENT  Because there is no cure for PCOS, it needs to be managed to prevent problems. Treatments are based on your symptoms. Treatment is also based on whether you want to have a baby or whether you need contraception.  Treatment may include:   Progesterone hormone to start a menstrual period.   Birth control pills to make you have  regular menstrual periods.   Medicines to make you ovulate, if you want to get pregnant.   Medicines to control your insulin.   Medicine to control your blood pressure.   Medicine and diet to control your high cholesterol and triglycerides in your blood.  Medicine to reduce excessive hair growth.  Surgery, making small holes in the ovary, to decrease the amount of female hormone production. This is done through a long, lighted tube (laparoscope) placed into the pelvis through a tiny incision in the lower abdomen.  HOME CARE INSTRUCTIONS  Only take over-the-counter or prescription medicine as directed by your health care provider.  Pay attention to the foods you eat and your activity levels. This can help reduce the effects of PCOS.  Keep your weight under control.  Eat foods that are low in carbohydrate and high in fiber.  Exercise regularly. SEEK MEDICAL CARE IF:  Your symptoms do not get better with medicine.  You have new symptoms.   This information is not intended to replace advice given to you by your health care provider. Make sure you discuss any questions you have with your health care provider.   Document Released: 10/22/2004 Document Revised: 04/18/2013 Document Reviewed: 12/14/2012 Elsevier Interactive Patient Education [employer]2016 Elsevier Inc.

## 2015-10-02 NOTE — Progress Notes (Signed)
BP 126/76 mmHg  Pulse 67  Temp(Src) 98.1 F (36.7 C)  Ht 5' 3.5" (1.613 m)  Wt 278 lb (126.1 kg)  BMI 48.47 kg/m2  LMP 09/24/2015   Subjective:    Patient ID: Maria Reyes, female    DOB: [birth year], 25 y.o.   MRN: [id number]  HPI: Maria Reyes is a 25 y.o. female presenting on 10/02/2015 for comprehensive medical examination. Current medical complaints include:  DIABETES- has been forgetting to take it Hypoglycemic episodes:no Polydipsia/polyuria: no Visual disturbance: no Chest pain: no Paresthesias: no Glucose Monitoring: yes  Accucheck frequency: every few days Taking Insulin?: no Blood Pressure Monitoring: a few times a month Retinal Examination: Not up to Date Foot Exam: Up to Date Diabetic Education: Not Completed Pneumovax: given today Influenza: Up to Date Aspirin: no  DEPRESSION- has been really stressed with work and school, not interested in medicine at this time. Will monitor and call with concerns.  Mood status: exacerbated Satisfied with current treatment?: no Symptom severity: moderate  Duration of current treatment : Not on anything Psychotherapy/counseling: no  Depressed mood: yes Anxious mood: yes Anhedonia: no Significant weight loss or gain: no Insomnia: no  Fatigue: yes Feelings of worthlessness or guilt: yes Impaired concentration/indecisiveness: yes Suicidal ideations: no Hopelessness: no Crying spells: yes Depression screen [facility] 2/9 10/02/2015  Decreased Interest 3  Down, Depressed, Hopeless 1  PHQ - 2 Score 4  Altered sleeping 3  Tired, decreased energy 3  Change in appetite 3  Feeling bad or failure about yourself  3  Trouble concentrating 2  Moving slowly or fidgety/restless 3  Suicidal thoughts 0  PHQ-9 Score 21  Difficult doing work/chores Somewhat difficult    She currently lives with: fiancee Menopausal Symptoms: no  Past Medical History:  Past Medical History  Diagnosis Date  . Ovarian cyst   . Diabetes mellitus  without complication (HCC)     Type Two  . Hypertension     Surgical History:  Past Surgical History  Procedure Laterality Date  . Laparoscopic ovarian cystectomy      Medications:  Current Outpatient Prescriptions on File Prior to Visit  Medication Sig  . medroxyPROGESTERone (PROVERA) 10 MG tablet Take 1 tablet (10 mg total) by mouth daily. Use for ten days  . metformin (GLUMETZA) 500 MG (MOD) 24 hr tablet 1 pill qAM x 1 week, then increase to 1 pill BID   No current facility-administered medications on file prior to visit.    Allergies:  No Known Allergies  Social History:  Social History   Social History  . Marital Status: Single    Spouse Name: N/A  . Number of Children: N/A  . Years of Education: N/A   Occupational History  . Not on file.   Social History Main Topics  . Smoking status: Never Smoker   . Smokeless tobacco: Never Used  . Alcohol Use: Yes     Comment: Occasionally  . Drug Use: No  . Sexual Activity: Yes    Birth Control/ Protection: None   Other Topics Concern  . Not on file   Social History Narrative   History  Smoking status  . Never Smoker   Smokeless tobacco  . Never Used   History  Alcohol Use  . Yes    Comment: Occasionally    Family History:  Family History  Problem Relation Age of Onset  . Endometriosis Mother   . Ovarian cysts Mother   . Cancer Mother     Breast and  Ovarian  . Diabetes Maternal Grandmother   . Hypertension Maternal Grandmother   . Diabetes Maternal Grandfather   . Diabetes Father   . Heart disease Father     Past medical history, surgical history, medications, allergies, family history and social history reviewed with patient today and changes made to appropriate areas of the chart.   Review of Systems  Constitutional: Negative.   HENT: Negative.   Eyes: Negative.   Respiratory: Negative.   Cardiovascular: Positive for leg swelling. Negative for chest pain, palpitations, orthopnea,  claudication and PND.  Gastrointestinal: Positive for diarrhea. Negative for heartburn, nausea, vomiting, abdominal pain, constipation, blood in stool and melena.  Genitourinary: Negative.   Musculoskeletal: Negative.   Skin: Negative.   Neurological: Negative.   Endo/Heme/Allergies: Positive for polydipsia. Negative for environmental allergies. Does not bruise/bleed easily.  Psychiatric/Behavioral: Positive for depression. Negative for suicidal ideas, hallucinations, memory loss and substance abuse. The patient is not nervous/anxious and does not have insomnia.     All other ROS negative except what is listed above and in the HPI.      Objective:    BP 126/76 mmHg  Pulse 67  Temp(Src) 98.1 F (36.7 C)  Ht 5' 3.5" (1.613 m)  Wt 278 lb (126.1 kg)  BMI 48.47 kg/m2  LMP 09/24/2015  Wt Readings from Last 3 Encounters:  10/02/15 281 lb (127.461 kg)  10/02/15 278 lb (126.1 kg)  08/19/15 280 lb 9.6 oz (127.279 kg)    Physical Exam  Constitutional: She is oriented to person, place, and time. She appears well-developed and well-nourished. No distress.  HENT:  Head: Normocephalic and atraumatic.  Right Ear: Hearing, tympanic membrane, external ear and ear canal normal.  Left Ear: Hearing, tympanic membrane, external ear and ear canal normal.  Nose: Nose normal.  Mouth/Throat: Uvula is midline, oropharynx is clear and moist and mucous membranes are normal. No oropharyngeal exudate.  Eyes: Conjunctivae, EOM and lids are normal. Pupils are equal, round, and reactive to light. Right eye exhibits no discharge. Left eye exhibits no discharge. No scleral icterus.  Neck: Normal range of motion. Neck supple. No JVD present. No tracheal deviation present. No thyromegaly present.  Cardiovascular: Normal rate, regular rhythm, normal heart sounds and intact distal pulses.  Exam reveals no gallop and no friction rub.   No murmur heard. Pulmonary/Chest: Effort normal and breath sounds normal. No  stridor. No respiratory distress. She has no decreased breath sounds. She has no wheezes. She has no rhonchi. She has no rales. She exhibits no tenderness.  Abdominal: Soft. Bowel sounds are normal. She exhibits no distension and no mass. There is no tenderness. There is no rebound and no guarding.  Genitourinary:  Pelvic and Breast exams Deferred, done at GYN  Musculoskeletal: Normal range of motion. She exhibits no edema or tenderness.  Lymphadenopathy:    She has cervical adenopathy.  Neurological: She is alert and oriented to person, place, and time. She has normal reflexes. She displays normal reflexes. No cranial nerve deficit. She exhibits normal muscle tone. Coordination normal.  Skin: Skin is warm, dry and intact. No rash noted. She is not diaphoretic. No erythema. No pallor.  Psychiatric: She has a normal mood and affect. Her speech is normal and behavior is normal. Judgment and thought content normal. Cognition and memory are normal.  Nursing note and vitals reviewed.   Results for orders placed or performed in visit on 10/02/15  Glucose Hemocue Waived  Result Value Ref Range   Glu Hemocue  Waived 155 (H) 65 - 99 mg/dL      Assessment & Plan:   Problem List Items Addressed This Visit      Endocrine   DM (diabetes mellitus), type 2 with renal complications (HCC)    Doing better on metformin, tolerating it well. Recheck A1c in 2 months.         Other   Abnormal TSH    Rechecking again today. Await results.       Depression    Not interested in treatment at this time. Will continue to monitor and recheck next visit. Call with any concerns or with changes.        Other Visit Diagnoses    Routine general medical examination at a health care facility    -  Primary    Screening labs checked today. Up to date on vaccines. Seeing GYN today for pap. Continue diet and exercise. Continue to monitor.     Diabetes mellitus without complication (HCC)        Relevant Orders     Glucose Hemocue Waived (Completed)    Routine screening for STI (sexually transmitted infection)        Labs drawn today, await results    Relevant Orders    HIV antibody    Immunization due        Pneumovax given today.    Relevant Orders    Pneumococcal polysaccharide vaccine 23-valent greater than or equal to 2yo subcutaneous/IM (Completed)        Follow up plan: Return in about 2 months (around 12/02/2015) for DM visit.   LABORATORY TESTING:  - Pap smear: done elsewhere- being done today  IMMUNIZATIONS:   - Tdap: Tetanus vaccination status reviewed: last tetanus booster within 10 years. - Influenza: Given elsewhere - Pneumovax: Administered today   PATIENT COUNSELING:   Advised to take 1 mg of folate supplement per day if capable of pregnancy.   Sexuality: Discussed sexually transmitted diseases, partner selection, use of condoms, avoidance of unintended pregnancy  and contraceptive alternatives.   Advised to avoid cigarette smoking.  I discussed with the patient that most people either abstain from alcohol or drink within safe limits (<=14/week and <=4 drinks/occasion for males, <=7/weeks and <= 3 drinks/occasion for females) and that the risk for alcohol disorders and other health effects rises proportionally with the number of drinks per week and how often a drinker exceeds daily limits.  Discussed cessation/primary prevention of drug use and availability of treatment for abuse.   Diet: Encouraged to adjust caloric intake to maintain  or achieve ideal body weight, to reduce intake of dietary saturated fat and total fat, to limit sodium intake by avoiding high sodium foods and not adding table salt, and to maintain adequate dietary potassium and calcium preferably from fresh fruits, vegetables, and low-fat dairy products.    stressed the importance of regular exercise  Injury prevention: Discussed safety belts, safety helmets, smoke detector, smoking near bedding or  upholstery.   Dental health: Discussed importance of regular tooth brushing, flossing, and dental visits.    NEXT PREVENTATIVE PHYSICAL DUE IN 1 YEAR. Return in about 2 months (around 12/02/2015) for DM visit.

## 2015-10-03 ENCOUNTER — Telehealth: Payer: Self-pay | Admitting: Family Medicine

## 2015-10-03 LAB — THYROID PANEL WITH TSH
FREE THYROXINE INDEX: 2.2 (ref 1.2–4.9)
T3 UPTAKE RATIO: 21 % — AB (ref 24–39)
T4, Total: 10.4 ug/dL (ref 4.5–12.0)
TSH: 4.64 u[IU]/mL — ABNORMAL HIGH (ref 0.450–4.500)

## 2015-10-03 LAB — HIV ANTIBODY (ROUTINE TESTING W REFLEX): HIV SCREEN 4TH GENERATION: NONREACTIVE

## 2015-10-03 MED ORDER — LEVOTHYROXINE SODIUM 25 MCG PO TABS: 25.0000 ug | ORAL_TABLET | Freq: Every day | ORAL | Status: DC

## 2015-10-03 NOTE — Telephone Encounter (Signed)
Called to give Maria Reyes her results. LMOM for her to call back. Her thyroid is still off. I've sent through a Rx to her pharmacy and we'll need to recheck her levels in 6 weeks. Everything else looked normal. OK to give her this message if she calls

## 2015-10-07 NOTE — Telephone Encounter (Signed)
Patient notified, she will call back to schedule her lab appointment.

## 2015-10-07 NOTE — Telephone Encounter (Signed)
Pt called stated she is returning Dr. Henriette CombsJohnson's call in regards to labs. Please call pt back ASAP. Thanks.

## 2015-10-17 ENCOUNTER — Other Ambulatory Visit: Payer: Self-pay | Admitting: Obstetrics and Gynecology

## 2015-10-19 LAB — THYROID PANEL WITH TSH
FREE THYROXINE INDEX: 2.9 (ref 1.2–4.9)
T3 UPTAKE RATIO: 26 % (ref 24–39)
T4 TOTAL: 11.2 ug/dL (ref 4.5–12.0)
TSH: 5.15 u[IU]/mL — ABNORMAL HIGH (ref 0.450–4.500)

## 2015-10-19 LAB — CBC
HEMATOCRIT: 38.8 % (ref 34.0–46.6)
Hemoglobin: 12.9 g/dL (ref 11.1–15.9)
MCH: 26.1 pg — ABNORMAL LOW (ref 26.6–33.0)
MCHC: 33.2 g/dL (ref 31.5–35.7)
MCV: 79 fL (ref 79–97)
PLATELETS: 369 10*3/uL (ref 150–379)
RBC: 4.94 x10E6/uL (ref 3.77–5.28)
RDW: 14.6 % (ref 12.3–15.4)
WBC: 11.1 10*3/uL — AB (ref 3.4–10.8)

## 2015-10-19 LAB — COMPREHENSIVE METABOLIC PANEL
ALBUMIN: 4.7 g/dL (ref 3.5–5.5)
ALK PHOS: 98 IU/L (ref 39–117)
ALT: 40 IU/L — ABNORMAL HIGH (ref 0–32)
AST: 35 IU/L (ref 0–40)
Albumin/Globulin Ratio: 1.5 (ref 1.2–2.2)
BILIRUBIN TOTAL: 0.2 mg/dL (ref 0.0–1.2)
BUN / CREAT RATIO: 22 (ref 9–23)
BUN: 10 mg/dL (ref 6–20)
CHLORIDE: 98 mmol/L (ref 96–106)
CO2: 22 mmol/L (ref 18–29)
CREATININE: 0.45 mg/dL — AB (ref 0.57–1.00)
Calcium: 9.6 mg/dL (ref 8.7–10.2)
GFR calc Af Amer: 164 mL/min/{1.73_m2} (ref >59)
GFR calc non Af Amer: 143 mL/min/{1.73_m2} (ref >59)
GLUCOSE: 137 mg/dL — AB (ref 65–99)
Globulin, Total: 3.1 g/dL (ref 1.5–4.5)
Potassium: 4.7 mmol/L (ref 3.5–5.2)
Sodium: 138 mmol/L (ref 134–144)
Total Protein: 7.8 g/dL (ref 6.0–8.5)

## 2015-10-19 LAB — ESTRADIOL: ESTRADIOL: 30 pg/mL

## 2015-10-19 LAB — INSULIN, RANDOM: INSULIN: 86.9 u[IU]/mL — AB (ref 2.6–24.9)

## 2015-10-19 LAB — DHEA-SULFATE: DHEA-SO4: 95.6 ug/dL — ABNORMAL LOW (ref 110.0–431.7)

## 2015-10-19 LAB — FERRITIN: FERRITIN: 108 ng/mL (ref 15–150)

## 2015-10-19 LAB — TESTOSTERONE, FREE, TOTAL, SHBG
SEX HORMONE BINDING: 15.1 nmol/L — AB (ref 24.6–122.0)
TESTOSTERONE FREE: 1.3 pg/mL (ref 0.0–4.2)
TESTOSTERONE: 12 ng/dL (ref 8–48)

## 2015-10-19 LAB — HEMOGLOBIN A1C
Est. average glucose Bld gHb Est-mCnc: 206 mg/dL
Hgb A1c MFr Bld: 8.8 % — ABNORMAL HIGH (ref 4.8–5.6)

## 2015-10-19 LAB — PROLACTIN: Prolactin: 14.8 ng/mL (ref 4.8–23.3)

## 2015-10-19 LAB — FSH/LH
FSH: 6.3 m[IU]/mL
LH: 4 m[IU]/mL

## 2015-10-19 LAB — PROGESTERONE

## 2015-10-21 ENCOUNTER — Encounter: Payer: BLUE CROSS/BLUE SHIELD | Admitting: Obstetrics and Gynecology

## 2015-10-30 ENCOUNTER — Ambulatory Visit: Payer: BLUE CROSS/BLUE SHIELD | Admitting: Obstetrics and Gynecology

## 2015-11-06 ENCOUNTER — Encounter: Payer: Self-pay | Admitting: Family Medicine

## 2015-12-02 ENCOUNTER — Ambulatory Visit: Payer: BLUE CROSS/BLUE SHIELD | Admitting: Family Medicine

## 2015-12-23 ENCOUNTER — Ambulatory Visit (INDEPENDENT_AMBULATORY_CARE_PROVIDER_SITE_OTHER): Payer: BLUE CROSS/BLUE SHIELD | Admitting: Obstetrics and Gynecology

## 2015-12-23 ENCOUNTER — Encounter: Payer: Self-pay | Admitting: Obstetrics and Gynecology

## 2015-12-23 ENCOUNTER — Other Ambulatory Visit: Payer: Self-pay | Admitting: Obstetrics and Gynecology

## 2015-12-23 VITALS — BP 140/96 | HR 75 | Ht 64.0 in | Wt 274.3 lb

## 2015-12-23 DIAGNOSIS — Z0000 Encounter for general adult medical examination without abnormal findings: Secondary | ICD-10-CM | POA: Diagnosis not present

## 2015-12-23 DIAGNOSIS — E039 Hypothyroidism, unspecified: Secondary | ICD-10-CM

## 2015-12-23 DIAGNOSIS — E139 Other specified diabetes mellitus without complications: Secondary | ICD-10-CM

## 2015-12-23 MED ORDER — DROSPIRENONE-ETHINYL ESTRADIOL 3-0.03 MG PO TABS: 1.0000 | ORAL_TABLET | Freq: Every day | ORAL | Status: DC

## 2015-12-23 MED ORDER — LEVOTHYROXINE SODIUM 50 MCG PO TABS: 50.0000 ug | ORAL_TABLET | Freq: Every day | ORAL | Status: DC

## 2015-12-23 MED ORDER — METFORMIN HCL 850 MG PO TABS: 850.0000 mg | ORAL_TABLET | Freq: Two times a day (BID) | ORAL | Status: DC

## 2015-12-23 NOTE — Patient Instructions (Signed)
Thank you for enrolling in MyChart. Please follow the instructions below to securely access your online medical record. MyChart allows you to send messages to your doctor, view your test results, renew your prescriptions, schedule appointments, and more.  How Do I Sign Up? 1. In your Internet browser, go to [email or web address]. 2. Click on the New  User? link in the Sign In box.  3. Enter your MyChart Access Code exactly as it appears below. You will not need to use this code after you have completed the sign-up process. If you do not sign up before the expiration date, you must request a new code. MyChart Access Code: [email or web address] Expires: 02/13/2016 10:53 AM  4. Enter the last four digits of your Social Security Number (xxxx) and Date of Birth (mm/dd/yyyy) as indicated and click Next. You will be taken to the next sign-up page. 5. Create a MyChart ID. This will be your MyChart login ID and cannot be changed, so think of one that is secure and easy to remember. 6. Create a MyChart password. You can change your password at any time. 7. Enter your Password Reset Question and Answer and click Next. This can be used at a later time if you forget your password.  8. Select your communication preference, and if applicable enter your e-mail address. You will receive e-mail notification when new information is available in MyChart by choosing to receive e-mail notifications and filling in your e-mail. 9. Click Sign In. You can now view your medical record.   Additional Information If you have questions, you can email [email or web address] or call [phone] to talk to our MyChart staff. Remember, MyChart is NOT to be used for urgent needs. For medical emergencies, dial 911.

## 2015-12-24 LAB — THYROID PANEL WITH TSH
Free Thyroxine Index: 2.4 (ref 1.2–4.9)
T3 Uptake Ratio: 25 % (ref 24–39)
T4, Total: 9.7 ug/dL (ref 4.5–12.0)
TSH: 3.39 u[IU]/mL (ref 0.450–4.500)

## 2015-12-24 LAB — COMPREHENSIVE METABOLIC PANEL
ALBUMIN: 4.5 g/dL (ref 3.5–5.5)
ALT: 35 IU/L — ABNORMAL HIGH (ref 0–32)
AST: 32 IU/L (ref 0–40)
Albumin/Globulin Ratio: 1.3 (ref 1.2–2.2)
Alkaline Phosphatase: 103 IU/L (ref 39–117)
BILIRUBIN TOTAL: 0.4 mg/dL (ref 0.0–1.2)
BUN / CREAT RATIO: 18 (ref 9–23)
BUN: 10 mg/dL (ref 6–20)
CHLORIDE: 93 mmol/L — AB (ref 96–106)
CO2: 23 mmol/L (ref 18–29)
CREATININE: 0.56 mg/dL — AB (ref 0.57–1.00)
Calcium: 10 mg/dL (ref 8.7–10.2)
GFR calc non Af Amer: 132 mL/min/{1.73_m2} (ref >59)
GFR, EST AFRICAN AMERICAN: 152 mL/min/{1.73_m2} (ref >59)
GLOBULIN, TOTAL: 3.5 g/dL (ref 1.5–4.5)
Glucose: 166 mg/dL — ABNORMAL HIGH (ref 65–99)
Potassium: 4.1 mmol/L (ref 3.5–5.2)
Sodium: 137 mmol/L (ref 134–144)
TOTAL PROTEIN: 8 g/dL (ref 6.0–8.5)

## 2015-12-24 LAB — CYTOLOGY - PAP

## 2015-12-24 LAB — HEMOGLOBIN A1C
Est. average glucose Bld gHb Est-mCnc: 209 mg/dL
Hgb A1c MFr Bld: 8.9 % — ABNORMAL HIGH (ref 4.8–5.6)

## 2015-12-24 LAB — INSULIN, RANDOM: INSULIN: 53.4 u[IU]/mL — ABNORMAL HIGH (ref 2.6–24.9)

## 2015-12-30 ENCOUNTER — Ambulatory Visit: Payer: BLUE CROSS/BLUE SHIELD | Admitting: Obstetrics and Gynecology

## 2016-01-21 ENCOUNTER — Ambulatory Visit: Payer: Self-pay | Admitting: Endocrinology

## 2016-01-22 ENCOUNTER — Telehealth: Payer: Self-pay | Admitting: Endocrinology

## 2016-01-22 NOTE — Telephone Encounter (Signed)
Patient no showed today's appt. Please advise on how to follow up. °A. No follow up necessary. °B. Follow up urgent. Contact patient immediately. °C. Follow up necessary. Contact patient and schedule visit in ___ days. °D. Follow up advised. Contact patient and schedule visit in ____weeks. ° °

## 2016-01-22 NOTE — Telephone Encounter (Signed)
Please come back for a follow-up appointment in 2 months.    

## 2016-01-23 ENCOUNTER — Ambulatory Visit: Payer: Self-pay | Admitting: *Deleted

## 2016-01-23 NOTE — Telephone Encounter (Signed)
LVM for PT to schedule

## 2016-02-04 NOTE — Progress Notes (Signed)
Subjective:     Patient ID: Maria Reyes, female   DOB: [birth year], 25 y.o.   MRN: [id number]  HPI Here for repeat pap  Review of Systems none    Objective:   Physical Exam A&O x4  well groomed female in no distress Blood pressure (!) 140/96, pulse 75, height 5\' 4"  (1.626 m), weight 274 lb 4.8 oz (124.4 kg), last menstrual period 12/22/2015. Pelvic exam: normal external genitalia, vulva, vagina, cervix, uterus and adnexa.    Assessment:     Repeat pap    Plan:     RTC in 6 months or prn based on this pap results.  Blaire Palomino Rosalia, CNM

## 2016-02-15 ENCOUNTER — Other Ambulatory Visit: Payer: Self-pay | Admitting: Obstetrics and Gynecology

## 2016-03-09 ENCOUNTER — Ambulatory Visit (INDEPENDENT_AMBULATORY_CARE_PROVIDER_SITE_OTHER): Payer: BLUE CROSS/BLUE SHIELD | Admitting: Endocrinology

## 2016-03-09 ENCOUNTER — Encounter: Payer: Self-pay | Admitting: Endocrinology

## 2016-03-09 DIAGNOSIS — E1122 Type 2 diabetes mellitus with diabetic chronic kidney disease: Secondary | ICD-10-CM | POA: Diagnosis not present

## 2016-03-09 LAB — POCT GLYCOSYLATED HEMOGLOBIN (HGB A1C): Hemoglobin A1C: 7.8

## 2016-03-09 MED ORDER — CANAGLIFLOZIN 100 MG PO TABS: 100.0000 mg | ORAL_TABLET | Freq: Every day | ORAL | 11 refills | Status: DC

## 2016-03-09 MED ORDER — DEXAMETHASONE 1 MG PO TABS: ORAL_TABLET | ORAL | 0 refills | Status: DC

## 2016-03-09 NOTE — Progress Notes (Signed)
Subjective:    Patient ID: Maria Reyes, female    DOB: Jun 11, 1991, 25 y.o.   MRN: [id number]  HPI pt states DM was dx'ed in 2016; she has mild if any neuropathy of the lower extremities; she is unaware of any associated chronic complications; she has never been on insulin; pt says her diet and exercise are good; she has never had GDM (G0), pancreatitis, severe hypoglycemia or DKA.   Past Medical History:  Diagnosis Date  . Diabetes mellitus without complication (HCC)    Type Two  . Hypertension   . Ovarian cyst     Past Surgical History:  Procedure Laterality Date  . LAPAROSCOPIC OVARIAN CYSTECTOMY      Social History   Social History  . Marital status: Single    Spouse name: N/A  . Number of children: N/A  . Years of education: N/A   Occupational History  . Not on file.   Social History Main Topics  . Smoking status: Never Smoker  . Smokeless tobacco: Never Used  . Alcohol use Yes     Comment: Occasionally  . Drug use: No  . Sexual activity: Yes    Birth control/ protection: None   Other Topics Concern  . Not on file   Social History Narrative  . No narrative on file    Current Outpatient Prescriptions on File Prior to Visit  Medication Sig Dispense Refill  . drospirenone-ethinyl estradiol (YASMIN 28) 3-0.03 MG tablet Take 1 tablet by mouth daily. 1 Package 11  . levothyroxine (SYNTHROID, LEVOTHROID) 50 MCG tablet TAKE 1 TABLET(50 MCG) BY MOUTH DAILY BEFORE BREAKFAST 30 tablet 0  . metFORMIN (GLUCOPHAGE) 850 MG tablet TAKE 1 TABLET BY MOUTH TWICE DAILY WITH MEALS 60 tablet 0   No current facility-administered medications on file prior to visit.     No Known Allergies  Family History  Problem Relation Age of Onset  . Endometriosis Mother   . Ovarian cysts Mother   . Cancer Mother     Breast and Ovarian  . Diabetes Father   . Heart disease Father   . Diabetes Maternal Grandmother   . Hypertension Maternal Grandmother   . Diabetes Maternal  Grandfather     BP 122/78   Pulse 63   Ht 5\' 4"  (1.626 m)   Wt 281 lb (127.5 kg)   BMI 48.23 kg/m    Review of Systems denies blurry vision, headache, chest pain, sob, n/v, urinary frequency, muscle cramps, excessive diaphoresis, depression, rhinorrhea, and easy bruising.  She has heat intolerance, weight gain, and fatigue.     Objective:   Physical Exam VS: see vs page GEN: no distress.  Morbid obesity HEAD: head: no deformity eyes: no periorbital swelling, no proptosis external nose and ears are normal mouth: no lesion seen NECK: supple, thyroid is not enlarged CHEST WALL: no deformity LUNGS: clear to auscultation CV: reg rate and rhythm, no murmur ABD: abdomen is soft, nontender.  no hepatosplenomegaly.  not distended.  no hernia MUSCULOSKELETAL: muscle bulk and strength are grossly normal.  no obvious joint swelling.  gait is normal and steady.  Buffalo hump is noted at the upper thoracic spine. EXTEMITIES: no deformity.  no ulcer on the feet.  feet are of normal color and temp.  1+ bilat leg edema PULSES: dorsalis pedis intact bilat.  no carotid bruit NEURO:  cn 2-12 grossly intact.   readily moves all 4's.  sensation is intact to touch on the feet SKIN:  Normal texture  and temperature.  No rash or suspicious lesion is visible.   NODES:  None palpable at the neck PSYCH: alert, well-oriented.  Does not appear anxious nor depressed.     A1c=7.8%  Lab Results  Component Value Date   TSH 3.390 12/23/2015   T4TOTAL 9.7 12/23/2015   CT: The pancreas, spleen, partially distended stomach, adrenal glands, and kidneys are normal in appearance.  I have reviewed outside records, and summarized:  Pt was seen for routine GYN OV, and was noted to have elevated a1c.      Assessment & Plan:  Type 2 DM: she needs increased rx, if it can be done with a regimen that avoids or minimizes hypoglycemia.   Morbid obesity: new to me.   Buffalo hump.  Cushing's should be excluded.

## 2016-03-09 NOTE — Patient Instructions (Addendum)
good diet and exercise significantly improve the control of your diabetes.  please let me know if you wish to be referred to a dietician.  high blood sugar is very risky to your health.  you should see an eye doctor and dentist every year.  It is very important to get all recommended vaccinations.  Controlling your blood pressure and cholesterol drastically reduces the damage diabetes does to your body.  Those who smoke should quit.  Please discuss these with your doctor.  check your blood sugar once a day.  vary the time of day when you check, between before the 3 meals, and at bedtime.  also check if you have symptoms of your blood sugar being too high or too low.  please keep a record of the readings and bring it to your next appointment here (or you can bring the meter itself).  You can write it on any piece of paper.  please call us sooner if your blood sugar goes below 70, or if you have a lot of readings over 200.  You should do a "dexamethasone suppression test."  For this, you would take dexamethasone 1 mg at 9-10 pm (I have sent a prescription to your pharmacy), then go to [facility] lab for a "cortisol" blood test the next morning before 9 am. You do not need to be fasting for this test.  In view of your medical condition, you should avoid pregnancy until we have decided it is safe.  Please consider having weight loss surgery.  It is good for your health.  Here is some information about it.  If you decide to consider further, please call the phone number in the papers, and register for a free informational meeting.  Please continue the same metformin, and: I have sent a prescription to your pharmacy, to add "invokana."  Please come back for a follow-up appointment in 2-3 months

## 2016-03-10 ENCOUNTER — Other Ambulatory Visit (HOSPITAL_COMMUNITY)
Admission: RE | Admit: 2016-03-10 | Discharge: 2016-03-10 | Disposition: A | Discharge status: Home / Self Care | Payer: BLUE CROSS/BLUE SHIELD | Source: Ambulatory Visit | Attending: Endocrinology | Admitting: Endocrinology

## 2016-03-10 LAB — CORTISOL: Cortisol, Plasma: 0.7 ug/dL

## 2016-03-20 ENCOUNTER — Other Ambulatory Visit: Payer: Self-pay | Admitting: Obstetrics and Gynecology

## 2016-03-26 ENCOUNTER — Encounter: Payer: Self-pay | Admitting: Family Medicine

## 2016-03-26 ENCOUNTER — Ambulatory Visit (INDEPENDENT_AMBULATORY_CARE_PROVIDER_SITE_OTHER): Payer: BLUE CROSS/BLUE SHIELD | Admitting: Family Medicine

## 2016-03-26 DIAGNOSIS — N898 Other specified noninflammatory disorders of vagina: Secondary | ICD-10-CM

## 2016-03-26 LAB — MICROSCOPIC EXAMINATION

## 2016-03-26 LAB — UA/M W/RFLX CULTURE, ROUTINE
BILIRUBIN UA: NEGATIVE
KETONES UA: NEGATIVE
Leukocytes, UA: NEGATIVE
NITRITE UA: NEGATIVE
RBC UA: NEGATIVE
SPEC GRAV UA: 1.015 (ref 1.005–1.030)
UUROB: 0.2 mg/dL (ref 0.2–1.0)
pH, UA: 5 (ref 5.0–7.5)

## 2016-03-26 LAB — WET PREP FOR TRICH, YEAST, CLUE
Clue Cell Exam: NEGATIVE
TRICHOMONAS EXAM: NEGATIVE
Yeast Exam: NEGATIVE

## 2016-03-26 MED ORDER — FLUCONAZOLE 150 MG PO TABS: 150.0000 mg | ORAL_TABLET | Freq: Every day | ORAL | 0 refills | Status: DC

## 2016-03-26 NOTE — Patient Instructions (Addendum)
Bariatric Surgery Information Bariatric surgery, also called weight loss surgery, is a procedure that helps you lose weight. You may consider or your health care provider may suggest bariatric surgery if:  You are severely obese and have been unable to lose weight through diet and exercise.  You have health problems related to obesity, such as:  Type 2 diabetes.  Heart disease.  Lung disease. HOW DOES BARIATRIC SURGERY HELP ME LOSE WEIGHT?  Bariatric surgery helps you lose weight by decreasing how much food your body absorbs. This is done by closing off part of your stomach to make it smaller. This restricts the amount of food your stomach can hold. Bariatric surgery can also change your body's regular digestive process, so that food bypasses the parts of your body that absorb calories and nutrients.  If you decide to have bariatric surgery, it is important to continue to eat a healthy diet and exercise regularly after the surgery.  WHAT ARE THE DIFFERENT KINDS OF BARIATRIC SURGERY?  There are two kinds of bariatric surgeries:  Restrictive surgeries make your stomach smaller. They do not change your digestive process. The smaller the size of your new stomach, the less food you can eat. There are different types of restrictive surgeries.  Malabsorptive surgeries both make your stomach smaller and alter your digestive process so that your body processes less calories and nutrients. These are the most common kind of bariatric surgery. There are different types of malabsorptive surgeries. WHAT ARE THE DIFFERENT TYPES OF RESTRICTIVE SURGERY? Adjustable Gastric Banding In this procedure, an inflatable band is placed around your stomach near the upper end. This makes the passageway for food into the rest of your stomach much smaller. The band can be adjusted, making it tighter or looser, by filling it with salt solution. Your surgeon can adjust the band based on how are you feeling and how much  weight you are losing. The band can be removed in the future.  Vertical Banded Gastroplasty In this procedure, staples are used to separate your stomach into two parts, a small upper pouch and a bigger lower pouch. This decreases how much food you can eat. Sleeve Gastrectomy In this procedure, your stomach is made smaller. This is done by surgically removing a large part of your stomach. When your stomach is smaller, you feel full more quickly and reduce how much you eat. WHAT ARE THE DIFFERENT TYPES OF MALABSORPTIVE SURGERY? Roux-en-Y Gastric Bypass (RGB) This is the most common weight loss surgery. In this procedure, a small stomach pouch is created in the upper part of your stomach. Next, this small stomach pouch is attached directly to the middle part of your small intestine. The farther down your small intestine the new connection is made, the fewer calories and nutrients you will absorb.  Biliopancreatic Diversion with Duodenal Switch (BPD/DS)  This is a multi-step procedure. In this procedure, a large part of your stomach is removed, making your stomach smaller. Next, this smaller stomach is attached to the lower part of your small intestine. Like the RGB surgery, you absorb fewer calories and nutrients the farther down your small intestine the attachment is made.   WHAT ARE THE RISKS OF BARIATRIC SURGERY? As with any surgical procedure, each type of bariatric surgery has its own risks. These risks also depend on your age, your overall health, and any other medical conditions you may have. When deciding on bariatric surgery, it is very important to:   Talk to your health care provider   and choose the surgery that is best for you.  Ask your health care provider about specific risks for the surgery you choose. FOR MORE INFORMATION  American Society for Metabolic & Bariatric Surgery: www.asmbs.org  Weight-control Information Network (WIN): win.StageSync.si   This information is not  intended to replace advice given to you by your health care provider. Make sure you discuss any questions you have with your health care provider.   Document Released: 06/28/2005 Document Revised: 03/19/2015 Document Reviewed: 12/27/2012 Elsevier Interactive Patient Education 2016 [employer] Liraglutide injection (Weight Management) What is this medicine? LIRAGLUTIDE (LIR a GLOO tide) is used with a reduced calorie diet and exercise to help you lose weight. This medicine may be used for other purposes; ask your health care provider or pharmacist if you have questions. What should I tell my health care provider before I take this medicine? They need to know if you have any of these conditions: -endocrine tumors (MEN 2) or if someone in your family had these tumors -gallstones -high cholesterol -history of alcohol abuse problem -history of pancreatitis -kidney disease or if you are on dialysis -liver disease -previous swelling of the tongue, face, or lips with difficulty breathing, difficulty swallowing, hoarseness, or tightening of the throat -stomach problems -suicidal thoughts, plans, or attempt; a previous suicide attempt by you or a family member -thyroid cancer or if someone in your family had thyroid cancer -an unusual or allergic reaction to liraglutide, medicines, foods, dyes, or preservatives -pregnant or trying to get pregnant -breast-feeding How should I use this medicine? This medicine is for injection under the skin of your upper leg, stomach area, or upper arm. You will be taught how to prepare and give this medicine. Use exactly as directed. Take your medicine at regular intervals. Do not take it more often than directed. It is important that you put your used needles and syringes in a special sharps container. Do not put them in a trash can. If you do not have a sharps container, call your pharmacist or healthcare provider to get one. A special MedGuide will be given to  you by the pharmacist with each prescription and refill. Be sure to read this information carefully each time. Talk to your pediatrician regarding the use of this medicine in children. Special care may be needed. Overdosage: If you think you have taken too much of this medicine contact a poison control center or emergency room at once. NOTE: This medicine is only for you. Do not share this medicine with others. What if I miss a dose? If you miss a dose, take it as soon as you can. If it is almost time for your next dose, take only that dose. Do not take double or extra doses. If you miss your dose for 3 days or more, call your doctor or health care professional to talk about how to restart this medicine. What may interact with this medicine? -acetaminophen -atorvastatin -birth control pills -digoxin -griseofulvin -lisinopril This list may not describe all possible interactions. Give your health care provider a list of all the medicines, herbs, non-prescription drugs, or dietary supplements you use. Also tell them if you smoke, drink alcohol, or use illegal drugs. Some items may interact with your medicine. What should I watch for while using this medicine? Visit your doctor or health care professional for regular checks on your progress. This medicine is intended to be used in addition to a healthy diet and appropriate exercise. The best results are achieved this  way. Do not increase or in any way change your dose without consulting your doctor or health care professional. This medicine may affect blood sugar levels. If you have diabetes, check with your doctor or health care professional before you change your diet or the dose of your diabetic medicine. Patients and their families should watch out for worsening depression or thoughts of suicide. Also watch out for sudden changes in feelings such as feeling anxious, agitated, panicky, irritable, hostile, aggressive, impulsive, severely restless,  overly excited and hyperactive, or not being able to sleep. If this happens, especially at the beginning of treatment or after a change in dose, call your health care professional. What side effects may I notice from receiving this medicine? Side effects that you should report to your doctor or health care professional as soon as possible: -allergic reactions like skin rash, itching or hives, swelling of the face, lips, or tongue -breathing problems -fever, chills -loss of appetite -signs and symptoms of low blood sugar such as feeling anxious, confusion, dizziness, increased hunger, unusually weak or tired, sweating, shakiness, cold, irritable, headache, blurred vision, fast heartbeat, loss of consciousness -trouble passing urine or change in the amount of urine -unusual stomach pain or upset -vomiting Side effects that usually do not require medical attention (Report these to your doctor or health care professional if they continue or are bothersome.): -constipation -diarrhea -fatigue -headache -nausea This list may not describe all possible side effects. Call your doctor for medical advice about side effects. You may report side effects to FDA at 1-800-FDA-1088. Where should I keep my medicine? Keep out of the reach of children. Store unopened pen in a refrigerator between 2 and 8 degrees C (36 and 46 degrees F). Do not freeze or use if the medicine has been frozen. Protect from light and excessive heat. After you first use the pen, it can be stored at room temperature between 15 and 30 degrees C (59 and 86 degrees F) or in a refrigerator. Throw away your used pen after 30 days or after the expiration date, whichever comes first. Do not store your pen with the needle attached. If the needle is left on, medicine may leak from the pen. NOTE: This sheet is a summary. It may not cover all possible information. If you have questions about this medicine, talk to your doctor, pharmacist, or health  care provider.    2016, Elsevier/Gold Standard. (2013-08-23 12:29:49) Bupropion; Naltrexone extended-release tablets What is this medicine? BUPROPION; NALTREXONE (byoo PROE pee on; nal TREX one) is a combination product used to promote and maintain weight loss in obese adults or overweight adults who also have weight related medical problems. This medicine should be used with a reduced calorie diet and increased physical activity. This medicine may be used for other purposes; ask your health care provider or pharmacist if you have questions. What should I tell my health care provider before I take this medicine? They need to know if you have any of these conditions: -an eating disorder, such as anorexia or bulimia -diabetes -glaucoma -head injury -heart disease -high blood pressure -history of a drug or alcohol abuse problem -history of a tumor or infection of your brain or spine -history of stroke -history of irregular heartbeat -kidney disease -liver disease -mental illness such as bipolar disorder or psychosis -seizures -suicidal thoughts, plans, or attempt; a previous suicide attempt by you or a family member -an unusual or allergic reaction to bupropion, naltrexone, other medicines, foods, dyes, or preservatives  breast-feeding -pregnant or trying to become pregnant How should I use this medicine? Take this medicine by mouth with a glass of water. Follow the directions on the prescription label. Take this medicine in the morning and in the evenings as directed by your healthcare professional. Bonita Quin can take it with or without food. Do not take with high-fat meals as this may increase your risk of seizures. Do not crush, chew, or cut these tablets. Do not take your medicine more often than directed. Do not stop taking this medicine suddenly except upon the advice of your doctor. A special MedGuide will be given to you by the pharmacist with each prescription and refill. Be sure to read  this information carefully each time. Talk to your pediatrician regarding the use of this medicine in children. Special care may be needed. Overdosage: If you think you have taken too much of this medicine contact a poison control center or emergency room at once. NOTE: This medicine is only for you. Do not share this medicine with others. What if I miss a dose? If you miss a dose, skip the missed dose and take your next tablet at the regular time. Do not take double or extra doses. What may interact with this medicine? Do not take this medicine with any of the following medications: -any prescription or street opioid drug like codiene, heroin, methadone -linezolid -MAOIs like Carbex, Eldepryl, Marplan, Nardil, and Parnate -methylene blue (injected into a vein) -other medicines that contain bupropion like Zyban or Wellbutrin This medicine may also interact with the following medications: -alcohol -certain medicines for anxiety or sleep -certain medicines for blood pressure like metoprolol, propranolol -certain medicines for depression or psychotic disturbances -certain medicines for HIV or AIDS like efavirenz, lopinavir, nelfinavir, ritonavir -certain medicines for irregular heart beat like propafenone, flecainide -certain medicines for Parkinson's disease like amantadine, levodopa -certain medicines for seizures like carbamazepine, phenytoin, phenobarbital -cimetidine -clopidogrel -cyclophosphamide -disulfiram -furazolidone -isoniazid -nicotine -orphenadrine -procarbazine -steroid medicines like prednisone or cortisone -stimulant medicines for attention disorders, weight loss, or to stay awake -tamoxifen -theophylline -thioridazine -thiotepa -ticlopidine -tramadol -warfarin This list may not describe all possible interactions. Give your health care provider a list of all the medicines, herbs, non-prescription drugs, or dietary supplements you use. Also tell them if you smoke,  drink alcohol, or use illegal drugs. Some items may interact with your medicine. What should I watch for while using this medicine? This medicine is intended to be used in addition to a healthy diet and appropriate exercise. The best results are achieved this way. Do not increase or in any way change your dose without consulting your doctor or health care professional. Do not take this medicine with other prescription or over-the-counter weight loss products without consulting your doctor or health care professional. Your doctor should tell you to stop taking this medicine if you do not lose a certain amount of weight within the first 12 weeks of treatment. Visit your doctor or health care professional for regular checkups. Your doctor may order blood tests or other tests to see how you are doing. This medicine may affect blood sugar levels. If you have diabetes, check with your doctor or health care professional before you change your diet or the dose of your diabetic medicine. Patients and their families should watch out for new or worsening depression or thoughts of suicide. Also watch out for sudden changes in feelings such as feeling anxious, agitated, panicky, irritable, hostile, aggressive, impulsive, severely restless, overly excited and  hyperactive, or not being able to sleep. If this happens, especially at the beginning of treatment or after a change in dose, call your health care professional. Avoid alcoholic drinks while taking this medicine. Drinking large amounts of alcoholic beverages, using sleeping or anxiety medicines, or quickly stopping the use of these agents while taking this medicine may increase your risk for a seizure. What side effects may I notice from receiving this medicine? Side effects that you should report to your doctor or health care professional as soon as possible: -allergic reactions like skin rash, itching or hives, swelling of the face, lips, or tongue -breathing  problems -changes in vision, hearing -chest pain -confusion -dark urine -depressed mood -fast or irregular heart beat -fever -hallucination, loss of contact with reality -increased blood pressure -light-colored stools -redness, blistering, peeling or loosening of the skin, including inside the mouth -right upper belly pain -seizures -suicidal thoughts or other mood changes -unusually weak or tired -vomiting -yellowing of the eyes or skin Side effects that usually do not require medical attention (Report these to your doctor or health care professional if they continue or are bothersome.): -constipation -diarrhea -dizziness -dry mouth -headache -nausea -trouble sleeping This list may not describe all possible side effects. Call your doctor for medical advice about side effects. You may report side effects to FDA at 1-800-FDA-1088. Where should I keep my medicine? Keep out of the reach of children. Store at room temperature between 15 and 30 degrees C (59 and 86 degrees F). Throw away any unused medicine after the expiration date. NOTE: This sheet is a summary. It may not cover all possible information. If you have questions about this medicine, talk to your doctor, pharmacist, or health care provider.    2016, Elsevier/Gold Standard. (2013-04-04 15:17:29) Lorcaserin oral tablets What is this medicine? LORCASERIN (lor ca SER in) is used to promote and maintain weight loss in obese patients. This medicine should be used with a reduced calorie diet and, if appropriate, an exercise program. This medicine may be used for other purposes; ask your health care provider or pharmacist if you have questions. What should I tell my health care provider before I take this medicine? They need to know if you have any of these conditions: -anatomical deformation of the penis, Peyronie's disease, or history of priapism (painful and prolonged erection) -diabetes -heart disease -history of  blood diseases, like sickle cell anemia or leukemia -history of irregular heartbeat -kidney disease -liver disease -suicidal thoughts, plans, or attempt; a previous suicide attempt by you or a family member -an unusual or allergic reaction to lorcaserin, other medicines, foods, dyes, or preservatives -pregnant or trying to get pregnant -breast-feeding How should I use this medicine? Take this medicine by mouth with a glass of water. Follow the directions on the prescription label. You can take it with or without food. Take your medicine at regular intervals. Do not take it more often than directed. Do not stop taking except on your doctor's advice. Talk to your pediatrician regarding the use of this medicine in children. Special care may be needed. Overdosage: If you think you have taken too much of this medicine contact a poison control center or emergency room at once. NOTE: This medicine is only for you. Do not share this medicine with others. What if I miss a dose? If you miss a dose, take it as soon as you can. If it is almost time for your next dose, take only that dose. Do not take  double or extra doses. What may interact with this medicine? -cabergoline -certain medicines for depression, anxiety, or psychotic disturbances -certain medicines for erectile dysfunction -certain medicines for migraine headache like almotriptan, eletriptan, frovatriptan, naratriptan, rizatriptan, sumatriptan, zolmitriptan -dextromethorphan -linezolid -lithium -medicines for diabetes -other weight loss products -tramadol -St. John's Wort -stimulant medicines for attention disorders, weight loss, or to stay awake -tryptophan This list may not describe all possible interactions. Give your health care provider a list of all the medicines, herbs, non-prescription drugs, or dietary supplements you use. Also tell them if you smoke, drink alcohol, or use illegal drugs. Some items may interact with your  medicine. What should I watch for while using this medicine? This medicine is intended to be used in addition to a healthy diet and appropriate exercise. The best results are achieved this way. Your doctor should instruct you to stop taking this medicine if you do not lose a certain amount of weight within the first 12 weeks of treatment, but it is important that you do not change your dose in any way without consulting your doctor or health care professional. Visit your doctor or health care professional for regular checkups. Your doctor may order blood tests or other tests to see how you are doing. Do not drive, use machinery, or do anything that needs mental alertness until you know how this medicine affects you. This medicine may affect blood sugar levels. If you have diabetes, check with your doctor or health care professional before you change your diet or the dose of your diabetic medicine. Patients and their families should watch out for worsening depression or thoughts of suicide. Also watch out for sudden changes in feelings such as feeling anxious, agitated, panicky, irritable, hostile, aggressive, impulsive, severely restless, overly excited and hyperactive, or not being able to sleep. If this happens, especially at the beginning of treatment or after a change in dose, call your health care professional. Contact your doctor or health care professional right away if you are a man with an erection that lasts longer than 4 hours or if the erection becomes painful. This may be a sign of serious problem and must be treated right away to prevent permanent damage. What side effects may I notice from receiving this medicine? Side effects that you should report to your doctor or health care professional as soon as possible: -allergic reactions like skin rash, itching or hives, swelling of the face, lips, or tongue -abnormal production of milk -breast enlargement in both males and females -breathing  problems -changes in emotions or moods -changes in vision -confusion -erection lasting more than 4 hours or a painful erection -fast or irregular heart beat -feeling faint or lightheaded, falls -fever or chills, sore throat -hallucination, loss of contact with reality -high or low blood pressure -menstrual changes -restlessness -slow or irregular heartbeat -stiff muscles -sweating -suicidal thoughts or other mood changes -swelling of the ankles, feet, hands -unusually weak or tired -vomiting Side effects that usually do not require medical attention (Report these to your doctor or health care professional if they continue or are bothersome.): -back pain -constipation -cough -dry mouth -nausea -tiredness This list may not describe all possible side effects. Call your doctor for medical advice about side effects. You may report side effects to FDA at 1-800-FDA-1088. Where should I keep my medicine? Keep out of the reach of children. This medicine can be abused. Keep your medicine in a safe place to protect it from theft. Do not share this medicine  with anyone. Selling or giving away this medicine is dangerous and against the law. Store at room temperature between 15 and 30 degrees C (59 and 86 degrees F). Throw away any unused medicine after the expiration date. NOTE: This sheet is a summary. It may not cover all possible information. If you have questions about this medicine, talk to your doctor, pharmacist, or health care provider.    2016, Elsevier/Gold Standard. (2015-02-03 16:21:05)

## 2016-03-26 NOTE — Assessment & Plan Note (Signed)
Would like to consider bariatric surgery. Would like to have a referral. Referral generated. Letter generated. Will also consider oral weight loss agents. Will call if she decides to do that.

## 2016-03-26 NOTE — Progress Notes (Signed)
BP (!) 148/94 (BP Location: Left Arm, Patient Position: Sitting, Cuff Size: Large)   Pulse 86   Temp 98.1 F (36.7 C)   Ht 5\' 4"  (1.626 m)   Wt 276 lb (125.2 kg)   SpO2 98%   BMI 47.38 kg/m    Subjective:    Patient ID: Maria Reyes, female    DOB: [birth year], 25 y.o.   MRN: [id number]  HPI: Maria Reyes is a 25 y.o. female  Chief Complaint  Patient presents with  . Vaginitis  . Weight Loss Surgery    Patient would like to discuss this with you   VAGINAL DISCHARGE Duration: 2 weeks Discharge description: None  Pruritus: yes Dysuria: yes Malodorous: no Urinary frequency: yes Fevers: no Abdominal pain: no  Sexual activity: monogamous History of sexually transmitted diseases: no Recent antibiotic use: no Context: worse  Treatments attempted: antifungal  WEIGHT GAIN Duration: chronic Previous attempts at weight loss: yes Complications of obesity: DM, HTN Peak weight: 310 Weight loss goal: to be healthy  Weight loss to date: 30lbs Requesting obesity pharmacotherapy: no Current weight loss supplements/medications: no Previous weight loss supplements/meds: no  Would like to have weight loss surgery -- Has not done any formal weight loss groups -- Did do diet and exercise on her own -- Did liquid diet -- Fasting -- No weight loss medicine  Relevant past medical, surgical, family and social history reviewed and updated as indicated. Interim medical history since our last visit reviewed. Allergies and medications reviewed and updated.  Review of Systems  Constitutional: Negative.   Respiratory: Negative.   Cardiovascular: Negative.   Genitourinary: Negative for decreased urine volume, difficulty urinating, dyspareunia, dysuria, enuresis, flank pain, frequency, genital sores, hematuria, menstrual problem, pelvic pain, urgency, vaginal bleeding, vaginal discharge and vaginal pain.  Psychiatric/Behavioral: Negative.     Per HPI unless specifically indicated  above     Objective:    BP (!) 148/94 (BP Location: Left Arm, Patient Position: Sitting, Cuff Size: Large)   Pulse 86   Temp 98.1 F (36.7 C)   Ht 5\' 4"  (1.626 m)   Wt 276 lb (125.2 kg)   SpO2 98%   BMI 47.38 kg/m   Wt Readings from Last 3 Encounters:  03/26/16 276 lb (125.2 kg)  03/09/16 281 lb (127.5 kg)  12/23/15 274 lb 4.8 oz (124.4 kg)    Physical Exam  Abdominal: Hernia confirmed negative in the right inguinal area and confirmed negative in the left inguinal area.  Genitourinary: No labial fusion. There is no rash, tenderness, lesion or injury on the right labia. There is no rash, tenderness, lesion or injury on the left labia. There is erythema in the vagina. No tenderness or bleeding in the vagina. No foreign body in the vagina. No signs of injury around the vagina. No vaginal discharge found.  Lymphadenopathy:       Right: No inguinal adenopathy present.       Left: No inguinal adenopathy present.    Results for orders placed or performed during the hospital encounter of 03/10/16  Cortisol  Result Value Ref Range   Cortisol, Plasma 0.7 ug/dL      Assessment & Plan:   Problem List Items Addressed This Visit      Other   Morbid obesity (HCC) - Primary    Would like to consider bariatric surgery. Would like to have a referral. Referral generated. Letter generated. Will also consider oral weight loss agents. Will call if she decides to do  that.       Relevant Orders   Ambulatory referral to General Surgery    Other Visit Diagnoses    Vaginal discharge       Appears to be yeast infection- Rx for 1 week of diflucan given. Continue to monitor.    Relevant Orders   UA/M w/rflx Culture, Routine   WET PREP FOR TRICH, YEAST, CLUE       Follow up plan: Return if symptoms worsen or fail to improve.

## 2016-03-30 ENCOUNTER — Telehealth: Payer: Self-pay | Admitting: Family Medicine

## 2016-03-30 ENCOUNTER — Encounter: Payer: Self-pay | Admitting: Family Medicine

## 2016-03-30 NOTE — Telephone Encounter (Signed)
Letter printed to send off

## 2016-03-30 NOTE — Telephone Encounter (Signed)
Pt called and stated that she needs another letter going in depth about how long she's been treated for her obesity.

## 2016-04-08 ENCOUNTER — Ambulatory Visit: Payer: Self-pay | Admitting: General Surgery

## 2016-04-09 ENCOUNTER — Other Ambulatory Visit (HOSPITAL_COMMUNITY): Payer: Self-pay | Admitting: General Surgery

## 2016-04-19 ENCOUNTER — Ambulatory Visit (HOSPITAL_COMMUNITY)
Admission: RE | Admit: 2016-04-19 | Discharge: 2016-04-19 | Disposition: A | Discharge status: Home / Self Care | Payer: BLUE CROSS/BLUE SHIELD | Source: Ambulatory Visit | Attending: General Surgery | Admitting: General Surgery

## 2016-04-19 ENCOUNTER — Other Ambulatory Visit: Payer: Self-pay

## 2016-04-19 DIAGNOSIS — Z01810 Encounter for preprocedural cardiovascular examination: Secondary | ICD-10-CM | POA: Diagnosis present

## 2016-04-19 DIAGNOSIS — Z01818 Encounter for other preprocedural examination: Secondary | ICD-10-CM | POA: Insufficient documentation

## 2016-05-03 ENCOUNTER — Ambulatory Visit: Payer: BLUE CROSS/BLUE SHIELD | Admitting: Skilled Nursing Facility1

## 2016-05-11 ENCOUNTER — Ambulatory Visit: Payer: BLUE CROSS/BLUE SHIELD | Admitting: Endocrinology

## 2016-05-20 ENCOUNTER — Ambulatory Visit: Payer: BLUE CROSS/BLUE SHIELD | Admitting: Dietician

## 2016-06-07 ENCOUNTER — Ambulatory Visit: Payer: BLUE CROSS/BLUE SHIELD | Admitting: Dietician

## 2016-07-08 ENCOUNTER — Encounter: Payer: Self-pay | Admitting: Family Medicine

## 2016-07-08 ENCOUNTER — Ambulatory Visit (INDEPENDENT_AMBULATORY_CARE_PROVIDER_SITE_OTHER): Payer: BLUE CROSS/BLUE SHIELD | Admitting: Family Medicine

## 2016-07-08 VITALS — BP 141/84 | HR 76 | Temp 98.0°F | Wt 269.6 lb

## 2016-07-08 DIAGNOSIS — E1122 Type 2 diabetes mellitus with diabetic chronic kidney disease: Secondary | ICD-10-CM

## 2016-07-08 DIAGNOSIS — B379 Candidiasis, unspecified: Secondary | ICD-10-CM

## 2016-07-08 LAB — UA/M W/RFLX CULTURE, ROUTINE
Bilirubin, UA: NEGATIVE
GLUCOSE, UA: NEGATIVE
Leukocytes, UA: NEGATIVE
NITRITE UA: NEGATIVE
SPEC GRAV UA: 1.025 (ref 1.005–1.030)
UUROB: 0.2 mg/dL (ref 0.2–1.0)
pH, UA: 5.5 (ref 5.0–7.5)

## 2016-07-08 LAB — MICROALBUMIN, URINE WAIVED
Creatinine, Urine Waived: 200 mg/dL (ref 10–300)
MICROALB, UR WAIVED: 150 mg/L — AB (ref 0–19)

## 2016-07-08 LAB — MICROSCOPIC EXAMINATION
Bacteria, UA: NONE SEEN
WBC UA: NONE SEEN /HPF (ref 0–?)

## 2016-07-08 LAB — BAYER DCA HB A1C WAIVED: HB A1C: 7.6 % — AB (ref <7.0)

## 2016-07-08 MED ORDER — FLUCONAZOLE 150 MG PO TABS
150.0000 mg | ORAL_TABLET | Freq: Every day | ORAL | 0 refills | Status: DC
Start: 2016-07-08 — End: 2016-09-10

## 2016-07-08 MED ORDER — LEVOTHYROXINE SODIUM 50 MCG PO TABS: ORAL_TABLET | ORAL | 4 refills | Status: DC

## 2016-07-08 MED ORDER — METFORMIN HCL 850 MG PO TABS: 850.0000 mg | ORAL_TABLET | Freq: Two times a day (BID) | ORAL | 1 refills | Status: DC

## 2016-07-08 MED ORDER — DULAGLUTIDE 0.75 MG/0.5ML ~~LOC~~ SOAJ: 0.7500 mg | SUBCUTANEOUS | 3 refills | Status: DC

## 2016-07-08 MED ORDER — FLUCONAZOLE 150 MG PO TABS: 150.0000 mg | ORAL_TABLET | Freq: Two times a day (BID) | ORAL | 0 refills | Status: DC

## 2016-07-08 NOTE — Progress Notes (Signed)
BP (!) 141/84 (BP Location: Left Arm, Patient Position: Sitting, Cuff Size: Large)   Pulse 76   Temp 98 F (36.7 C)   Wt 269 lb 9.6 oz (122.3 kg)   SpO2 99%   BMI 46.28 kg/m    Subjective:    Patient ID: Maria Reyes, female    DOB: 1991/02/15, 25 y.o.   MRN: [id number]  HPI: Maria Reyes is a 25 y.o. female  Chief Complaint  Patient presents with  . Medication Reaction    Patient states that she has stopped the Invokana due to it causing yeast infections.    DIABETES- stopped the invokanna about a week ago, yeast infections are better, but not gone yet.  Hypoglycemic episodes:no Polydipsia/polyuria: yes Visual disturbance: no Chest pain: no Paresthesias: no Glucose Monitoring: yes  Accucheck frequency: Daily  Fasting glucose: 60-100  Post prandial: 120  Evening: 100-90 Taking Insulin?: no Blood Pressure Monitoring: not checking Retinal Examination: Not up to Date Foot Exam: Up to Date Diabetic Education: Completed Pneumovax: Up to Date Influenza: Declined Aspirin: no  Relevant past medical, surgical, family and social history reviewed and updated as indicated. Interim medical history since our last visit reviewed. Allergies and medications reviewed and updated.  Review of Systems  Constitutional: Negative.   Respiratory: Negative.   Cardiovascular: Negative.   Skin: Positive for rash.  Psychiatric/Behavioral: Negative.     Per HPI unless specifically indicated above     Objective:    BP (!) 141/84 (BP Location: Left Arm, Patient Position: Sitting, Cuff Size: Large)   Pulse 76   Temp 98 F (36.7 C)   Wt 269 lb 9.6 oz (122.3 kg)   SpO2 99%   BMI 46.28 kg/m   Wt Readings from Last 3 Encounters:  07/08/16 269 lb 9.6 oz (122.3 kg)  03/26/16 276 lb (125.2 kg)  03/09/16 281 lb (127.5 kg)    Physical Exam  Constitutional: She is oriented to person, place, and time. She appears well-developed and well-nourished. No distress.  HENT:  Head:  Normocephalic and atraumatic.  Right Ear: Hearing normal.  Left Ear: Hearing normal.  Nose: Nose normal.  Eyes: Conjunctivae and lids are normal. Right eye exhibits no discharge. Left eye exhibits no discharge. No scleral icterus.  Cardiovascular: Normal rate, regular rhythm, normal heart sounds and intact distal pulses.  Exam reveals no gallop and no friction rub.   No murmur heard. Pulmonary/Chest: Effort normal and breath sounds normal. No respiratory distress. She has no wheezes. She has no rales. She exhibits no tenderness.  Musculoskeletal: Normal range of motion.  Neurological: She is alert and oriented to person, place, and time.  Skin: Skin is warm, dry and intact. Rash (erythematous rash in groin) noted. She is not diaphoretic. No erythema. No pallor.  Psychiatric: She has a normal mood and affect. Her speech is normal and behavior is normal. Judgment and thought content normal. Cognition and memory are normal.  Nursing note and vitals reviewed.   Results for orders placed or performed in visit on 03/26/16  Microscopic Examination  Result Value Ref Range   WBC, UA 0-5 0 - 5 /hpf   RBC, UA 0-2 0 - 2 /hpf   Epithelial Cells (non renal) 0-10 0 - 10 /hpf   Mucus, UA Present Not Estab.   Bacteria, UA Few None seen/Few  WET PREP FOR TRICH, YEAST, CLUE  Result Value Ref Range   Trichomonas Exam Negative Negative   Yeast Exam Negative Negative   Clue Cell  Exam Negative Negative  UA/M w/rflx Culture, Routine  Result Value Ref Range   Specific Gravity, UA 1.015 1.005 - 1.030   pH, UA 5.0 5.0 - 7.5   Color, UA Yellow Yellow   Appearance Ur Cloudy (A) Clear   Leukocytes, UA Negative Negative   Protein, UA 2+ (A) Negative/Trace   Glucose, UA 2+ (A) Negative   Ketones, UA Negative Negative   RBC, UA Negative Negative   Bilirubin, UA Negative Negative   Urobilinogen, Ur 0.2 0.2 - 1.0 mg/dL   Nitrite, UA Negative Negative   Microscopic Examination See below:       Assessment &  Plan:   Problem List Items Addressed This Visit      Endocrine   DM (diabetes mellitus), 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 Stop invokanna. Start trulicity. 1st shot given today in the office. Recheck 3 months. Call with any concerns.       Relevant Medications   Dulaglutide (TRULICITY) 0.75 MG/0.5ML SOPN   metFORMIN (GLUCOPHAGE) 850 MG tablet   Other Relevant Orders   Bayer DCA Hb A1c Waived   Comprehensive metabolic panel   Lipid Panel w/o Chol/HDL Ratio   Microalbumin, Urine Waived   UA/M w/rflx Culture, Routine    Other Visit Diagnoses    Yeast infection       Will treat with diflucan. Call if not getting better or getting worse.    Relevant Medications   fluconazole (DIFLUCAN) 150 MG tablet       Follow up plan: Return in about 3 months (around 10/06/2016) for DM visit.

## 2016-07-08 NOTE — Assessment & Plan Note (Signed)
Yeast infections with invokanna. Stop invokanna. Start trulicity. 1st shot given today in the office. Recheck 3 months. Call with any concerns.

## 2016-07-09 LAB — COMPREHENSIVE METABOLIC PANEL
A/G RATIO: 1.2 (ref 1.2–2.2)
ALK PHOS: 76 IU/L (ref 39–117)
ALT: 45 IU/L — AB (ref 0–32)
AST: 69 IU/L — ABNORMAL HIGH (ref 0–40)
Albumin: 4.1 g/dL (ref 3.5–5.5)
BUN/Creatinine Ratio: 30 — ABNORMAL HIGH (ref 9–23)
BUN: 14 mg/dL (ref 6–20)
CHLORIDE: 100 mmol/L (ref 96–106)
CO2: 23 mmol/L (ref 18–29)
Calcium: 10 mg/dL (ref 8.7–10.2)
Creatinine, Ser: 0.46 mg/dL — ABNORMAL LOW (ref 0.57–1.00)
GFR calc non Af Amer: 141 mL/min/{1.73_m2} (ref >59)
GFR, EST AFRICAN AMERICAN: 162 mL/min/{1.73_m2} (ref >59)
Globulin, Total: 3.3 g/dL (ref 1.5–4.5)
Glucose: 143 mg/dL — ABNORMAL HIGH (ref 65–99)
POTASSIUM: 4.5 mmol/L (ref 3.5–5.2)
Sodium: 141 mmol/L (ref 134–144)
TOTAL PROTEIN: 7.4 g/dL (ref 6.0–8.5)

## 2016-07-09 LAB — LIPID PANEL W/O CHOL/HDL RATIO
Cholesterol, Total: 198 mg/dL (ref 100–199)
HDL: 51 mg/dL (ref >39)
LDL Calculated: 82 mg/dL (ref 0–99)
Triglycerides: 327 mg/dL — ABNORMAL HIGH (ref 0–149)
VLDL Cholesterol Cal: 65 mg/dL — ABNORMAL HIGH (ref 5–40)

## 2016-07-19 ENCOUNTER — Telehealth: Payer: Self-pay | Admitting: Family Medicine

## 2016-07-20 NOTE — Telephone Encounter (Signed)
Prior Auth started, will wait for approval or denial. Will check status tomorrow.

## 2016-07-21 ENCOUNTER — Other Ambulatory Visit: Payer: Self-pay | Admitting: Family Medicine

## 2016-07-21 ENCOUNTER — Telehealth: Payer: Self-pay | Admitting: Family Medicine

## 2016-07-21 MED ORDER — LIRAGLUTIDE 18 MG/3ML ~~LOC~~ SOPN: PEN_INJECTOR | SUBCUTANEOUS | 3 refills | Status: DC

## 2016-07-21 NOTE — Telephone Encounter (Signed)
-----   Message from Margarito Coursereresa D Palacios Medina sent at 07/21/2016 10:14 AM EST ----- Determination

## 2016-07-21 NOTE — Telephone Encounter (Signed)
Called Maria Reyes to let her know that her insurance would not cover her trulicity. Rx changed to victoza and sent to her pharmacy. She is aware.

## 2016-07-23 NOTE — Telephone Encounter (Signed)
Medication denied, prescription changed and sent to the pharmacy. Patient has been notified.

## 2016-08-23 ENCOUNTER — Telehealth: Payer: Self-pay | Admitting: Family Medicine

## 2016-08-23 NOTE — Telephone Encounter (Addendum)
Patient called due to issues she is having since starting the "non-insulin" Victosa and she is having dizziness, and pt is very nauseous and stomach has a "knot" on her side also.  Please advise.  Thanks

## 2016-08-23 NOTE — Telephone Encounter (Signed)
Routing to provider  

## 2016-08-23 NOTE — Telephone Encounter (Signed)
Needs appointment so we can check BP

## 2016-08-23 NOTE — Telephone Encounter (Signed)
Called and scheduled patient an appointment for 08/30/16.

## 2016-08-30 ENCOUNTER — Ambulatory Visit: Payer: Self-pay | Admitting: Family Medicine

## 2016-09-03 ENCOUNTER — Ambulatory Visit: Payer: Self-pay | Admitting: Family Medicine

## 2016-09-10 ENCOUNTER — Encounter: Payer: Self-pay | Admitting: Family Medicine

## 2016-09-10 ENCOUNTER — Ambulatory Visit (INDEPENDENT_AMBULATORY_CARE_PROVIDER_SITE_OTHER): Payer: BLUE CROSS/BLUE SHIELD | Admitting: Family Medicine

## 2016-09-10 VITALS — BP 127/86 | HR 89 | Temp 98.0°F | Resp 17 | Ht 64.0 in | Wt 265.0 lb

## 2016-09-10 DIAGNOSIS — E1122 Type 2 diabetes mellitus with diabetic chronic kidney disease: Secondary | ICD-10-CM | POA: Diagnosis not present

## 2016-09-10 DIAGNOSIS — B379 Candidiasis, unspecified: Secondary | ICD-10-CM | POA: Diagnosis not present

## 2016-09-10 DIAGNOSIS — R1030 Lower abdominal pain, unspecified: Secondary | ICD-10-CM

## 2016-09-10 DIAGNOSIS — N3001 Acute cystitis with hematuria: Secondary | ICD-10-CM | POA: Diagnosis not present

## 2016-09-10 LAB — BAYER DCA HB A1C WAIVED: HB A1C (BAYER DCA - WAIVED): 6.9 % (ref <7.0)

## 2016-09-10 MED ORDER — NYSTATIN 100000 UNIT/GM EX POWD: Freq: Four times a day (QID) | CUTANEOUS | 0 refills | Status: DC

## 2016-09-10 MED ORDER — CIPROFLOXACIN HCL 500 MG PO TABS: 500.0000 mg | ORAL_TABLET | Freq: Two times a day (BID) | ORAL | 0 refills | Status: DC

## 2016-09-10 NOTE — Progress Notes (Signed)
BP 127/86 (BP Location: Left Arm, Patient Position: Sitting, Cuff Size: Large)   Pulse 89   Temp 98 F (36.7 C) (Oral)   Resp 17   Ht 5\' 4"  (1.626 m)   Wt 265 lb (120.2 kg)   LMP 08/29/2016 (Approximate)   SpO2 99%   BMI 45.49 kg/m    Subjective:    Patient ID: Maria Reyes, female    DOB: [birth year], 26 y.o.   MRN: [id number]  HPI: Maria Reyes is a 26 y.o. female  Chief Complaint  Patient presents with  . Diabetes   DIABETES- not feeling great. She has been slacking on her medicine. She notes that when she started taking the shot, she notes that she was having a lot of pain in her stomach and swelling in her back. She notes that she has been having hot flashes and nausea and dizziness. Also having blurred vision. She notes that she stopped her medicine and felt better. She notes that she has not been taking any of her medicine. She has not been feeling better. She notes that the dizziness got better. But the nausea- nothing has changed since coming off the medicine.  Hypoglycemic episodes:no Polydipsia/polyuria: no Visual disturbance: yes Chest pain: no Paresthesias: no Glucose Monitoring: yes  Accucheck frequency: BID   Fasting: 100-140  Evening- 170s-180s   Taking Insulin?: no Blood Pressure Monitoring: not checking Retinal Examination: Not up to Date Foot Exam: Up to Date Diabetic Education: Not Completed Pneumovax: Up to date Influenza: Not up to Date Aspirin: no   RASH Duration:  2 weeks  Location: groin  Itching: no Burning: yes Redness: yes Oozing: no Scaling: yes Blisters: no Painful: yes Fevers: no Change in detergents/soaps/personal care products: no Recent illness: no Recent travel:no History of same: no Context: better Alleviating factors: summer's eve powder, A&D ointment Treatments attempted: A&D ointment, summer's eve powder Shortness of breath: no  Throat/tongue swelling: no Myalgias/arthralgias: no   ABDOMINAL PAIN  Duration:  1-1.5 weeks Onset: gradual Severity: mild Quality: cramping Location:  Lower quadrants into her low back  Episode duration: couple of hours Radiation: yes Frequency: intermittent couple hours, happens 2-3x  Alleviating factors: sitting Aggravating factors: up walking around Status: stable Treatments attempted: tylenol, tums, ibuprofen Fever: no Nausea: yes Vomiting: yes Weight loss: no Decreased appetite: yes Diarrhea: yes Constipation: no Blood in stool: no Heartburn: yes Jaundice: no Rash: yes Dysuria/urinary frequency: no Hematuria: no History of sexually transmitted disease: no Recurrent NSAID use: no  Relevant past medical, surgical, family and social history reviewed and updated as indicated. Interim medical history since our last visit reviewed. Allergies and medications reviewed and updated.  Review of Systems  Constitutional: Negative.   Respiratory: Negative.   Cardiovascular: Negative.   Gastrointestinal: Positive for abdominal pain, diarrhea and nausea. Negative for abdominal distention, anal bleeding, blood in stool, constipation, rectal pain and vomiting.  Genitourinary: Negative.   Musculoskeletal: Positive for back pain and myalgias. Negative for arthralgias, gait problem, joint swelling, neck pain and neck stiffness.  Skin: Positive for rash. Negative for color change, pallor and wound.  Psychiatric/Behavioral: Negative.     Per HPI unless specifically indicated above     Objective:    BP 127/86 (BP Location: Left Arm, Patient Position: Sitting, Cuff Size: Large)   Pulse 89   Temp 98 F (36.7 C) (Oral)   Resp 17   Ht 5\' 4"  (1.626 m)   Wt 265 lb (120.2 kg)   LMP 08/29/2016 (Approximate)  SpO2 99%   BMI 45.49 kg/m   Wt Readings from Last 3 Encounters:  09/10/16 265 lb (120.2 kg)  07/08/16 269 lb 9.6 oz (122.3 kg)  03/26/16 276 lb (125.2 kg)    Physical Exam  Constitutional: She is oriented to person, place, and time. She appears  well-developed and well-nourished. No distress.  HENT:  Head: Normocephalic and atraumatic.  Right Ear: Hearing normal.  Left Ear: Hearing normal.  Nose: Nose normal.  Eyes: Conjunctivae and lids are normal. Right eye exhibits no discharge. Left eye exhibits no discharge. No scleral icterus.  Cardiovascular: Normal rate, regular rhythm, normal heart sounds and intact distal pulses.  Exam reveals no gallop and no friction rub.   No murmur heard. Pulmonary/Chest: Effort normal and breath sounds normal. No respiratory distress. She has no wheezes. She has no rales. She exhibits no tenderness.  Abdominal: Soft. Bowel sounds are normal. She exhibits no distension and no mass. There is tenderness (mild tenderness lower quadrants bilaterally). There is no rebound and no guarding.  Musculoskeletal: Normal range of motion.  Neurological: She is alert and oriented to person, place, and time.  Skin: Skin is warm, dry and intact. Rash (erythematous rash under pannus) noted. She is not diaphoretic. No erythema. No pallor.  Psychiatric: She has a normal mood and affect. Her speech is normal and behavior is normal. Judgment and thought content normal. Cognition and memory are normal.  Nursing note and vitals reviewed.   Results for orders placed or performed in visit on 07/08/16  Microscopic Examination  Result Value Ref Range   WBC, UA None seen 0 - 5 /hpf   RBC, UA >30 (A) 0 - 2 /hpf   Epithelial Cells (non renal) 0-10 0 - 10 /hpf   Bacteria, UA None seen None seen/Few  Comprehensive metabolic panel  Result Value Ref Range   Glucose 143 (H) 65 - 99 mg/dL   BUN 14 6 - 20 mg/dL   Creatinine, Ser 5.360.46 (L) 0.57 - 1.00 mg/dL   GFR calc non Af Amer 141 >59 mL/min/1.73   GFR calc Af Amer 162 >59 mL/min/1.73   BUN/Creatinine Ratio 30 (H) 9 - 23   Sodium 141 134 - 144 mmol/L   Potassium 4.5 3.5 - 5.2 mmol/L   Chloride 100 96 - 106 mmol/L   CO2 23 18 - 29 mmol/L   Calcium 10.0 8.7 - 10.2 mg/dL   Total  Protein 7.4 6.0 - 8.5 g/dL   Albumin 4.1 3.5 - 5.5 g/dL   Globulin, Total 3.3 1.5 - 4.5 g/dL   Albumin/Globulin Ratio 1.2 1.2 - 2.2   Bilirubin Total <0.2 0.0 - 1.2 mg/dL   Alkaline Phosphatase 76 39 - 117 IU/L   AST 69 (H) 0 - 40 IU/L   ALT 45 (H) 0 - 32 IU/L  Lipid Panel w/o Chol/HDL Ratio  Result Value Ref Range   Cholesterol, Total 198 100 - 199 mg/dL   Triglycerides 644327 (H) 0 - 149 mg/dL   HDL 51 >03>39 mg/dL   VLDL Cholesterol Cal 65 (H) 5 - 40 mg/dL   LDL Calculated 82 0 - 99 mg/dL  Microalbumin, Urine Waived  Result Value Ref Range   Microalb, Ur Waived 150 (H) 0 - 19 mg/L   Creatinine, Urine Waived 200 10 - 300 mg/dL   Microalb/Creat Ratio 30-300 (H) <30 mg/g  UA/M w/rflx Culture, Routine  Result Value Ref Range   Specific Gravity, UA 1.025 1.005 - 1.030   pH, UA  5.5 5.0 - 7.5   Color, UA Red (A) Yellow   Appearance Ur Clear Clear   Leukocytes, UA Negative Negative   Protein, UA 3+ (A) Negative/Trace   Glucose, UA Negative Negative   Ketones, UA Trace (A) Negative   RBC, UA 3+ (A) Negative   Bilirubin, UA Negative Negative   Urobilinogen, Ur 0.2 0.2 - 1.0 mg/dL   Nitrite, UA Negative Negative   Microscopic Examination See below:   Bayer DCA Hb A1c Waived  Result Value Ref Range   Bayer DCA Hb A1c Waived 7.6 (H) <7.0 %      Assessment & Plan:   Problem List Items Addressed This Visit      Endocrine   DM (diabetes mellitus), 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 Will treat with metformin and recheck A1c in 3 months.       Relevant Orders   Bayer DCA Hb A1c Waived     Other   Morbid obesity (HCC)    Continue to work on diet and exercise. Call with any concerns.        Other Visit Diagnoses    Lower abdominal pain       Seems to be due to UTI. Pregnancy negative. Will treat with cipro. Call with any concerns or if not getting better.    Relevant Orders   Pregnancy, urine   UA/M w/rflx Culture,  Routine   Yeast infection       Will treat with nystatin. Call with any concerns.    Relevant Medications   nystatin (MYCOSTATIN/NYSTOP) powder   Acute cystitis with hematuria       Will treat with cipro. Call with any concerns.        Follow up plan: Return in about 3 months (around 12/11/2016) for Follow up DM.

## 2016-09-10 NOTE — Assessment & Plan Note (Signed)
A1c 6.9- does not want to take truclicity. Will treat with metformin and recheck A1c in 3 months.

## 2016-09-10 NOTE — Assessment & Plan Note (Signed)
Continue to work on diet and exercise. Call with any concerns.  

## 2016-09-13 ENCOUNTER — Ambulatory Visit: Payer: BLUE CROSS/BLUE SHIELD | Admitting: Family Medicine

## 2016-09-14 ENCOUNTER — Ambulatory Visit: Payer: Self-pay | Admitting: Family Medicine

## 2016-09-16 LAB — PREGNANCY, URINE: Preg Test, Ur: NEGATIVE

## 2016-09-16 LAB — UA/M W/RFLX CULTURE, ROUTINE
Bilirubin, UA: NEGATIVE
Glucose, UA: NEGATIVE
Ketones, UA: NEGATIVE
Nitrite, UA: POSITIVE — AB
Specific Gravity, UA: 1.02 (ref 1.005–1.030)
Urobilinogen, Ur: 0.2 mg/dL (ref 0.2–1.0)
pH, UA: 5.5 (ref 5.0–7.5)

## 2016-09-16 LAB — MICROSCOPIC EXAMINATION
Crystal Type: NEGATIVE
RBC MICROSCOPIC, UA: NONE SEEN /HPF (ref 0–?)

## 2016-09-16 LAB — URINE CULTURE, REFLEX

## 2016-11-04 ENCOUNTER — Encounter: Payer: Self-pay | Admitting: Family Medicine

## 2016-11-09 ENCOUNTER — Encounter: Payer: Self-pay | Admitting: Family Medicine

## 2016-11-09 ENCOUNTER — Ambulatory Visit (INDEPENDENT_AMBULATORY_CARE_PROVIDER_SITE_OTHER): Payer: BLUE CROSS/BLUE SHIELD | Admitting: Family Medicine

## 2016-11-09 VITALS — BP 128/84 | HR 90 | Temp 98.2°F | Wt 272.8 lb

## 2016-11-09 DIAGNOSIS — R300 Dysuria: Secondary | ICD-10-CM

## 2016-11-09 DIAGNOSIS — R7989 Other specified abnormal findings of blood chemistry: Secondary | ICD-10-CM

## 2016-11-09 DIAGNOSIS — R1030 Lower abdominal pain, unspecified: Secondary | ICD-10-CM

## 2016-11-09 DIAGNOSIS — Z3A08 8 weeks gestation of pregnancy: Secondary | ICD-10-CM | POA: Diagnosis not present

## 2016-11-09 DIAGNOSIS — R946 Abnormal results of thyroid function studies: Secondary | ICD-10-CM

## 2016-11-09 DIAGNOSIS — E1122 Type 2 diabetes mellitus with diabetic chronic kidney disease: Secondary | ICD-10-CM | POA: Diagnosis not present

## 2016-11-09 DIAGNOSIS — I10 Essential (primary) hypertension: Secondary | ICD-10-CM | POA: Diagnosis not present

## 2016-11-09 DIAGNOSIS — N912 Amenorrhea, unspecified: Secondary | ICD-10-CM

## 2016-11-09 MED ORDER — PRENATAL 27-1 MG PO TABS: 1.0000 | ORAL_TABLET | Freq: Every day | ORAL | 12 refills | Status: DC

## 2016-11-09 MED ORDER — LEVOTHYROXINE SODIUM 50 MCG PO TABS: ORAL_TABLET | ORAL | 4 refills | Status: DC

## 2016-11-09 NOTE — Assessment & Plan Note (Signed)
Newly diagnosed pregnancy. Will check A1c today, likely will need insulin. Await results. Will refer back to OBGYN.

## 2016-11-09 NOTE — Assessment & Plan Note (Signed)
Newly diagnosed pregnancy. Will check TSH today, will restart synthroid. Await results. Will refer back to OBGYN.

## 2016-11-09 NOTE — Progress Notes (Signed)
BP 128/84 (BP Location: Left Arm, Patient Position: Sitting, Cuff Size: Large)   Pulse 90   Temp 98.2 F (36.8 C)   Wt 272 lb 12.8 oz (123.7 kg)   LMP 09/13/2016 (Within Days)   SpO2 97%   BMI 46.83 kg/m    Subjective:    Patient ID: Maria Reyes, female    DOB: 02/24/1991, 26 y.o.   MRN: [id number]  HPI: Maria Reyes is a 26 y.o. female  Chief Complaint  Patient presents with  . Amenorrhea   Has been having pain in her belly. Has been has been having swelling in her ankles, has been having pain in her belly after sex. She stopped her birth control pills. Very tender breasts. She has not been trying to get pregnant, but has not been preventing it. Has not had a period since the beginning of March. No other concerns or complaints today. She has not been taking any of her medicine.    Relevant past medical, surgical, family and social history reviewed and updated as indicated. Interim medical history since our last visit reviewed. Allergies and medications reviewed and updated.  Review of Systems  Constitutional: Negative.   Respiratory: Negative.   Cardiovascular: Negative.   Gastrointestinal: Positive for abdominal pain. Negative for abdominal distention and anal bleeding.    Per HPI unless specifically indicated above     Objective:    BP 128/84 (BP Location: Left Arm, Patient Position: Sitting, Cuff Size: Large)   Pulse 90   Temp 98.2 F (36.8 C)   Wt 272 lb 12.8 oz (123.7 kg)   LMP 09/13/2016 (Within Days)   SpO2 97%   BMI 46.83 kg/m   Wt Readings from Last 3 Encounters:  11/09/16 272 lb 12.8 oz (123.7 kg)  09/10/16 265 lb (120.2 kg)  07/08/16 269 lb 9.6 oz (122.3 kg)    Physical Exam  Constitutional: She is oriented to person, place, and time. She appears well-developed and well-nourished. No distress.  HENT:  Head: Normocephalic and atraumatic.  Right Ear: Hearing normal.  Left Ear: Hearing normal.  Nose: Nose normal.  Eyes: Conjunctivae and lids  are normal. Right eye exhibits no discharge. Left eye exhibits no discharge. No scleral icterus.  Cardiovascular: Normal rate, regular rhythm, normal heart sounds and intact distal pulses.  Exam reveals no gallop and no friction rub.   No murmur heard. Pulmonary/Chest: Effort normal and breath sounds normal. No respiratory distress. She has no wheezes. She has no rales. She exhibits no tenderness.  Musculoskeletal: Normal range of motion.  Neurological: She is alert and oriented to person, place, and time.  Skin: Skin is warm, dry and intact. No rash noted. No erythema. No pallor.  Psychiatric: She has a normal mood and affect. Her speech is normal and behavior is normal. Judgment and thought content normal. Cognition and memory are normal.  Nursing note and vitals reviewed.   Results for orders placed or performed in visit on 09/10/16  Microscopic Examination  Result Value Ref Range   WBC, UA 6-10 (A) 0 - 5 /hpf   RBC, UA None seen 0 - 2 /hpf   Epithelial Cells (non renal) 0-10 0 - 10 /hpf   Crystal Type Negative N/A   Mucus, UA Present (A) Not Estab.   Bacteria, UA Moderate (A) None seen/Few  Bayer DCA Hb A1c Waived  Result Value Ref Range   Bayer DCA Hb A1c Waived 6.9 <7.0 %  Pregnancy, urine  Result Value Ref Range  Preg Test, Ur Negative Negative  UA/M w/rflx Culture, Routine  Result Value Ref Range   Specific Gravity, UA 1.020 1.005 - 1.030   pH, UA 5.5 5.0 - 7.5   Color, UA Yellow Yellow   Appearance Ur Cloudy (A) Clear   Leukocytes, UA 1+ (A) Negative   Protein, UA 1+ (A) Negative/Trace   Glucose, UA Negative Negative   Ketones, UA Negative Negative   RBC, UA 2+ (A) Negative   Bilirubin, UA Negative Negative   Urobilinogen, Ur 0.2 0.2 - 1.0 mg/dL   Nitrite, UA Positive (A) Negative   Microscopic Examination See below:    Urinalysis Reflex Comment   Urine Culture, Routine  Result Value Ref Range   Urine Culture, Routine Final report (A)    Urine Culture result 1  Escherichia coli (A)    ANTIMICROBIAL SUSCEPTIBILITY Comment       Assessment & Plan:   Problem List Items Addressed This Visit      Cardiovascular and Mediastinum   Hypertension    Under good control today. Will continue to monitor closely.         Endocrine   DM (diabetes mellitus), type 2 with renal complications (HCC)    Newly diagnosed pregnancy. Will check A1c today, likely will need insulin. Await results. Will refer back to OBGYN.       Relevant Orders   CBC with Differential/Platelet   Comprehensive metabolic panel   Hgb A1c w/o eAG     Other   Morbid obesity (HCC)    Discussed diet and exercise during pregnancy and working up slowly. Will get her back into GYN.      Relevant Orders   CBC with Differential/Platelet   Comprehensive metabolic panel   Abnormal TSH    Newly diagnosed pregnancy. Will check TSH today, will restart synthroid. Await results. Will refer back to OBGYN.       Relevant Orders   CBC with Differential/Platelet   Comprehensive metabolic panel   TSH    Other Visit Diagnoses    [redacted] weeks gestation of pregnancy    -  Primary   Will start PNV. Referral back to GYN. Call with any concerns.    Relevant Orders   Ambulatory referral to Obstetrics / Gynecology   Dysuria       UA clear.    Amenorrhea       + pregnancy   Relevant Orders   Pregnancy, urine   Lower abdominal pain       Likely due to pregnancy. Continue to monitor.    Relevant Orders   UA/M w/rflx Culture, Routine       Follow up plan: Return Pending lab results.

## 2016-11-09 NOTE — Assessment & Plan Note (Signed)
Discussed diet and exercise during pregnancy and working up slowly. Will get her back into GYN.

## 2016-11-09 NOTE — Assessment & Plan Note (Signed)
Under good control today. Will continue to monitor closely.

## 2016-11-09 NOTE — Patient Instructions (Addendum)
Abdominal Pain During Pregnancy Belly (abdominal) pain is common during pregnancy. Most of the time, it is not a serious problem. Other times, it can be a sign that something is wrong with the pregnancy. Always tell your doctor if you have belly pain. Follow these instructions at home: Monitor your belly pain for any changes. The following actions may help you feel better:  Do not have sex (intercourse) or put anything in your vagina until you feel better.  Rest until your pain stops.  Drink clear fluids if you feel sick to your stomach (nauseous). Do not eat solid food until you feel better.  Only take medicine as told by your doctor.  Keep all doctor visits as told. Get help right away if:  You are bleeding, leaking fluid, or pieces of tissue come out of your vagina.  You have more pain or cramping.  You keep throwing up (vomiting).  You have pain when you pee (urinate) or have blood in your pee.  You have a fever.  You do not feel your baby moving as much.  You feel very weak or feel like passing out.  You have trouble breathing, with or without belly pain.  You have a very bad headache and belly pain.  You have fluid leaking from your vagina and belly pain.  You keep having watery poop (diarrhea).  Your belly pain does not go away after resting, or the pain gets worse. This information is not intended to replace advice given to you by your health care provider. Make sure you discuss any questions you have with your health care provider. Document Released: 06/16/2009 Document Revised: 02/04/2016 Document Reviewed: 01/25/2013 Elsevier Interactive Patient Education  2017 [place] for Pregnant Women While you are pregnant, your body will require additional nutrition to help support your growing baby. It is recommended that you consume:  150 additional calories each day during your first trimester.  300 additional calories each day during your second  trimester.  300 additional calories each day during your third trimester. Eating a healthy, well-balanced diet is very important for your health and for your baby's health. You also have a higher need for some vitamins and minerals, such as folic acid, calcium, iron, and vitamin D. What do I need to know about eating during pregnancy?  Do not try to lose weight or go on a diet during pregnancy.  Choose healthy, nutritious foods. Choose  of a sandwich with a glass of milk instead of a candy bar or a high-calorie sugar-sweetened beverage.  Limit your overall intake of foods that have "empty calories." These are foods that have little nutritional value, such as sweets, desserts, candies, sugar-sweetened beverages, and fried foods.  Eat a variety of foods, especially fruits and vegetables.  Take a prenatal vitamin to help meet the additional needs during pregnancy, specifically for folic acid, iron, calcium, and vitamin D.  Remember to stay active. Ask your health care provider for exercise recommendations that are specific to you.  Practice good food safety and cleanliness, such as washing your hands before you eat and after you prepare raw meat. This helps to prevent foodborne illnesses, such as listeriosis, that can be very dangerous for your baby. Ask your health care provider for more information about listeriosis. What does 150 extra calories look like? Healthy options for an additional 150 calories each day could be any of the following:  Plain low-fat yogurt (6-8 oz) with  cup of berries.  1  apple with 2 teaspoons of peanut butter.  Cut-up vegetables with  cup of hummus.  Low-fat chocolate milk (8 oz or 1 cup).  1 string cheese with 1 medium orange.   of a peanut butter and jelly sandwich on whole-wheat bread (1 tsp of peanut butter). For 300 calories, you could eat two of those healthy options each day. What is a healthy amount of weight to gain? The recommended amount of  weight for you to gain is based on your pre-pregnancy BMI. If your pre-pregnancy BMI was:  Less than 18 (underweight), you should gain 28-40 lb.  18-24.9 (normal), you should gain 25-35 lb.  25-29.9 (overweight), you should gain 15-25 lb.  Greater than 30 (obese), you should gain 11-20 lb. What if I am having twins or multiples? Generally, pregnant women who will be having twins or multiples may need to increase their daily calories by 300-600 calories each day. The recommended range for total weight gain is 25-54 lb, depending on your pre-pregnancy BMI. Talk with your health care provider for specific guidance about additional nutritional needs, weight gain, and exercise during your pregnancy. What foods can I eat? Grains  Any grains. Try to choose whole grains, such as whole-wheat bread, oatmeal, or brown rice. Vegetables  Any vegetables. Try to eat a variety of colors and types of vegetables to get a full range of vitamins and minerals. Remember to wash your vegetables well before eating. Fruits  Any fruits. Try to eat a variety of colors and types of fruit to get a full range of vitamins and minerals. Remember to wash your fruits well before eating. Meats and Other Protein Sources  Lean meats, including chicken, [place], fish, and lean cuts of beef, veal, or pork. Make sure that all meats are cooked to "well done." Tofu. Tempeh. Beans. Eggs. Peanut butter and other nut butters. Seafood, such as shrimp, crab, and lobster. If you choose fish, select types that are higher in omega-3 fatty acids, including salmon, herring, mussels, trout, sardines, and pollock. Make sure that all meats are cooked to food-safe temperatures. Dairy  Pasteurized milk and milk alternatives. Pasteurized yogurt and pasteurized cheese. Cottage cheese. Sour cream. Beverages  Water. Juices that contain 100% fruit juice or vegetable juice. Caffeine-free teas and decaffeinated coffee. Drinks that contain caffeine are okay  to drink, but it is better to avoid caffeine. Keep your total caffeine intake to less than 200 mg each day (12 oz of coffee, tea, or soda) or as directed by your health care provider. Condiments  Any pasteurized condiments. Sweets and Desserts  Any sweets and desserts. Fats and Oils  Any fats and oils. The items listed above may not be a complete list of recommended foods or beverages. Contact your dietitian for more options.  What foods are not recommended? Vegetables  Unpasteurized (raw) vegetable juices. Fruits  Unpasteurized (raw) fruit juices. Meats and Other Protein Sources  Cured meats that have nitrates, such as bacon, salami, and hotdogs. Luncheon meats, bologna, or other deli meats (unless they are reheated until they are steaming hot). Refrigerated pate, meat spreads from a meat counter, smoked seafood that is found in the refrigerated section of a store. Raw fish, such as sushi or sashimi. High mercury content fish, such as tilefish, shark, swordfish, and king mackerel. Raw meats, such as tuna or beef tartare. Undercooked meats and poultry. Make sure that all meats are cooked to food-safe temperatures. Dairy  Unpasteurized (raw) milk and any foods that have raw milk in  them. Soft cheeses, such as feta, queso blanco, queso fresco, Brie, Camembert cheeses, blue-veined cheeses, and Panela cheese (unless it is made with pasteurized milk, which must be stated on the label). Beverages  Alcohol. Sugar-sweetened beverages, such as sodas, teas, or energy drinks. Condiments  Homemade fermented foods and drinks, such as pickles, sauerkraut, or kombucha drinks. (Store-bought pasteurized versions of these are okay.) Other  Salads that are made in the store, such as ham salad, chicken salad, egg salad, tuna salad, and seafood salad. The items listed above may not be a complete list of foods and beverages to avoid. Contact your dietitian for more information.  This information is not intended to  replace advice given to you by your health care provider. Make sure you discuss any questions you have with your health care provider. Document Released: 04/12/2014 Document Revised: 12/04/2015 Document Reviewed: 12/11/2013 Elsevier Interactive Patient Education  2017 [place] of Pregnancy The first trimester of pregnancy is from week 1 until the end of week 13 (months 1 through 3). A week after a sperm fertilizes an egg, the egg will implant on the wall of the uterus. This embryo will begin to develop into a baby. Genes from you and your partner will form the baby. The female genes will determine whether the baby will be a boy or a girl. At 6-8 weeks, the eyes and face will be formed, and the heartbeat can be seen on ultrasound. At the end of 12 weeks, all the baby's organs will be formed. Now that you are pregnant, you will want to do everything you can to have a healthy baby. Two of the most important things are to get good prenatal care and to follow your health care provider's instructions. Prenatal care is all the medical care you receive before the baby's birth. This care will help prevent, find, and treat any problems during the pregnancy and childbirth. Body changes during your first trimester Your body goes through many changes during pregnancy. The changes vary from woman to woman.  You may gain or lose a couple of pounds at first.  You may feel sick to your stomach (nauseous) and you may throw up (vomit). If the vomiting is uncontrollable, call your health care provider.  You may tire easily.  You may develop headaches that can be relieved by medicines. All medicines should be approved by your health care provider.  You may urinate more often. Painful urination may mean you have a bladder infection.  You may develop heartburn as a result of your pregnancy.  You may develop constipation because certain hormones are causing the muscles that push stool through your  intestines to slow down.  You may develop hemorrhoids or swollen veins (varicose veins).  Your breasts may begin to grow larger and become tender. Your nipples may stick out more, and the tissue that surrounds them (areola) may become darker.  Your gums may bleed and may be sensitive to brushing and flossing.  Dark spots or blotches (chloasma, mask of pregnancy) may develop on your face. This will likely fade after the baby is born.  Your menstrual periods will stop.  You may have a loss of appetite.  You may develop cravings for certain kinds of food.  You may have changes in your emotions from day to day, such as being excited to be pregnant or being concerned that something may go wrong with the pregnancy and baby.  You may have more vivid and strange dreams.  You may have changes in your hair. These can include thickening of your hair, rapid growth, and changes in texture. Some women also have hair loss during or after pregnancy, or hair that feels dry or thin. Your hair will most likely return to normal after your baby is born. What to expect at prenatal visits During a routine prenatal visit:  You will be weighed to make sure you and the baby are growing normally.  Your blood pressure will be taken.  Your abdomen will be measured to track your baby's growth.  The fetal heartbeat will be listened to between weeks 10 and 14 of your pregnancy.  Test results from any previous visits will be discussed. Your health care provider may ask you:  How you are feeling.  If you are feeling the baby move.  If you have had any abnormal symptoms, such as leaking fluid, bleeding, severe headaches, or abdominal cramping.  If you are using any tobacco products, including cigarettes, chewing tobacco, and electronic cigarettes.  If you have any questions. Other tests that may be performed during your first trimester include:  Blood tests to find your blood type and to check for the  presence of any previous infections. The tests will also be used to check for low iron levels (anemia) and protein on red blood cells (Rh antibodies). Depending on your risk factors, or if you previously had diabetes during pregnancy, you may have tests to check for high blood sugar that affects pregnant women (gestational diabetes).  Urine tests to check for infections, diabetes, or protein in the urine.  An ultrasound to confirm the proper growth and development of the baby.  Fetal screens for spinal cord problems (spina bifida) and Down syndrome.  HIV (human immunodeficiency virus) testing. Routine prenatal testing includes screening for HIV, unless you choose not to have this test.  You may need other tests to make sure you and the baby are doing well. Follow these instructions at home: Medicines   Follow your health care provider's instructions regarding medicine use. Specific medicines may be either safe or unsafe to take during pregnancy.  Take a prenatal vitamin that contains at least 600 micrograms (mcg) of folic acid.  If you develop constipation, try taking a stool softener if your health care provider approves. Eating and drinking   Eat a balanced diet that includes fresh fruits and vegetables, whole grains, good sources of protein such as meat, eggs, or tofu, and low-fat dairy. Your health care provider will help you determine the amount of weight gain that is right for you.  Avoid raw meat and uncooked cheese. These carry germs that can cause birth defects in the baby.  Eating four or five small meals rather than three large meals a day may help relieve nausea and vomiting. If you start to feel nauseous, eating a few soda crackers can be helpful. Drinking liquids between meals, instead of during meals, also seems to help ease nausea and vomiting.  Limit foods that are high in fat and processed sugars, such as fried and sweet foods.  To prevent constipation:  Eat foods  that are high in fiber, such as fresh fruits and vegetables, whole grains, and beans.  Drink enough fluid to keep your urine clear or pale yellow. Activity   Exercise only as directed by your health care provider. Most women can continue their usual exercise routine during pregnancy. Try to exercise for 30 minutes at least 5 days a week. Exercising will help you:  Control your weight.  Stay in shape.  Be prepared for labor and delivery.  Experiencing pain or cramping in the lower abdomen or lower back is a good sign that you should stop exercising. Check with your health care provider before continuing with normal exercises.  Try to avoid standing for long periods of time. Move your legs often if you must stand in one place for a long time.  Avoid heavy lifting.  Wear low-heeled shoes and practice good posture.  You may continue to have sex unless your health care provider tells you not to. Relieving pain and discomfort   Wear a good support bra to relieve breast tenderness.  Take warm sitz baths to soothe any pain or discomfort caused by hemorrhoids. Use hemorrhoid cream if your health care provider approves.  Rest with your legs elevated if you have leg cramps or low back pain.  If you develop varicose veins in your legs, wear support hose. Elevate your feet for 15 minutes, 3-4 times a day. Limit salt in your diet. Prenatal care   Schedule your prenatal visits by the twelfth week of pregnancy. They are usually scheduled monthly at first, then more often in the last 2 months before delivery.  Write down your questions. Take them to your prenatal visits.  Keep all your prenatal visits as told by your health care provider. This is important. Safety   Wear your seat belt at all times when driving.  Make a list of emergency phone numbers, including numbers for family, friends, the hospital, and police and fire departments. General instructions   Ask your health care provider  for a referral to a local prenatal education class. Begin classes no later than the beginning of month 6 of your pregnancy.  Ask for help if you have counseling or nutritional needs during pregnancy. Your health care provider can offer advice or refer you to specialists for help with various needs.  Do not use hot tubs, steam rooms, or saunas.  Do not douche or use tampons or scented sanitary pads.  Do not cross your legs for long periods of time.  Avoid cat litter boxes and soil used by cats. These carry germs that can cause birth defects in the baby and possibly loss of the fetus by miscarriage or stillbirth.  Avoid all smoking, herbs, alcohol, and medicines not prescribed by your health care provider. Chemicals in these products affect the formation and growth of the baby.  Do not use any products that contain nicotine or tobacco, such as cigarettes and e-cigarettes. If you need help quitting, ask your health care provider. You may receive counseling support and other resources to help you quit.  Schedule a dentist appointment. At home, brush your teeth with a soft toothbrush and be gentle when you floss. Contact a health care provider if:  You have dizziness.  You have mild pelvic cramps, pelvic pressure, or nagging pain in the abdominal area.  You have persistent nausea, vomiting, or diarrhea.  You have a bad smelling vaginal discharge.  You have pain when you urinate.  You notice increased swelling in your face, hands, legs, or ankles.  You are exposed to fifth disease or chickenpox.  You are exposed to [place] measles (rubella) and have never had it. Get help right away if:  You have a fever.  You are leaking fluid from your vagina.  You have spotting or bleeding from your vagina.  You have severe abdominal cramping or pain.  You have rapid  weight gain or loss.  You vomit blood or material that looks like coffee grounds.  You develop a severe headache.  You  have shortness of breath.  You have any kind of trauma, such as from a fall or a car accident. Summary  The first trimester of pregnancy is from week 1 until the end of week 13 (months 1 through 3).  Your body goes through many changes during pregnancy. The changes vary from woman to woman.  You will have routine prenatal visits. During those visits, your health care provider will examine you, discuss any test results you may have, and talk with you about how you are feeling. This information is not intended to replace advice given to you by your health care provider. Make sure you discuss any questions you have with your health care provider. Document Released: 06/22/2001 Document Revised: 06/09/2016 Document Reviewed: 06/09/2016 Elsevier Interactive Patient Education  2017 [employer]  Folic Acid and Pregnancy What is folic acid? Folic acid is a B vitamin. Your body needs it to make new cells. Folic acid is also called folate. Folate is the form of the B vitamin that is found naturally in food. Folic acid is the artificial (synthetic) form of the B vitamin. Folic acid is added to certain foods (fortified foods) and is also available in dietary supplements such as prenatal vitamins. All women who may become pregnant or are planning to become pregnant need at least 086-578 mcg of folic acid daily. Most pregnant women need 469-629 mcg of folic acid per day, but some women need more. What are the benefits of taking folic acid during pregnancy? Taking folic acid during pregnancy helps to prevent abnormalities that can develop in an unborn baby's brain, spine, or spinal column (neural tube defects). These defects include:  Spina bifida. This is when the spinal column does not close completely during development, leaving the spinal cord exposed. This means the nerves that control leg movements and other bodily functions do not work. Spina bifida causes lifelong disabilities.  Anencephaly. Babies  born with anencephaly have an underdeveloped brain. They may have little or no brain matter, and they could also be missing parts of the skull. Neural tube defects occur in the first few months (first trimester) of pregnancy. If you are trying to get pregnant, make sure you get enough folic acid for at least one month before you start trying. It is important to get enough folic acid even if you are not trying to get pregnant, because some pregnancies are unplanned.If your pregnancy is unplanned, start taking folic acid as soon as you find out that you are pregnant. Folic acid can also help to prevent a drop in red blood cells that carry oxygen throughout the body (anemia). Anemia during pregnancy is associated with complications such as low birth weight and premature birth. What are the side effects of taking folic acid? Folic acid supplements may cause side effects, such as:  Diarrhea.  Abdominal cramping. Folic acid can also interact with certain medicines that are used to treat other conditions. These medicines include:  Methotrexate. This is an anticancer drug that is also used to treat some autoimmune diseases.  Antiepileptic medicine, such as phenytoin, carbamazepine, and valproate.  Sulfasalazine. This medicine is used to treat ulcerative colitis. How should I take folic acid during pregnancy? Even women who have a healthy, well-balanced diet may not get enough folate from food. Synthetic folic acid is easier for your body to use than the folate  that is found naturally in certain foods. In addition to eating folate-rich foods, you can ensure that you get enough folic acid by taking prenatal vitamins and eating foods that are fortified with folic acid. Make sure your prenatal vitamin or B vitamin supplement contains 400-800 micrograms of folic acid. What foods should I eat? Folate is found naturally in:  Dark green leafy vegetables, such as spinach.  Asparagus.  Brussels  sprouts.  Citrus fruits and juices.  Nuts.  Beans.  Peas.  Eggs.  Meat, poultry, and seafood.  Soy products.  Whole grains. Folic acid is often added to certain foods, including:  Bread.  Pasta.  White rice.  Breakfast cereal.  Flour.  Cornmeal. When should I seek medical care? Some women may need to take more than the recommended amount of folic acid. Talk with your health care provider about your folic acid needs if:  You had a baby with a neural tube defect and you want to get pregnant again.  You have a family history of spina bifida.  You have spina bifida and you want to get pregnant. Talk with your doctor about folic acid supplements if you are taking medicine for any of the following conditions:  Epilepsy.  Type 2 diabetes.  Autoimmune diseases, including rheumatoid arthritis, lupus, psoriasis, celiac disease, and inflammatory bowel disease.  Asthma.  Kidney disease.  Liver disease.  Sickle cell disease. This information is not intended to replace advice given to you by your health care provider. Make sure you discuss any questions you have with your health care provider. Document Released: 07/01/2003 Document Revised: 05/24/2016 Document Reviewed: 03/12/2015 Elsevier Interactive Patient Education  2017 [place]. Hypothyroidism and Pregnancy Hypothyroidism is a condition that develops if you have an underactive thyroid gland. The thyroid is a small, butterfly-shaped gland in your neck, and it is located in front of your windpipe. It makes the thyroid hormones triiodothyronine (T3) and thyroxine (T4). These hormones play an important role in regulating your breathing, heart rate, menstrual cycle, body temperature, and other bodily functions. If you have hypothyroidism, your thyroid gland does not produce enough thyroid hormones. When you are pregnant, your body uses more thyroid hormones. This can cause mild hypothyroidism to get worse.  Hypothyroidism during pregnancy can lead to complications, including high blood pressure that develops after the 20th week of pregnancy (preeclampsia). Hypothyroidism can also affect your baby. Babies need thyroid hormone from their mothers for normal growth and brain development. Babies born to mothers with hypothyroidism during pregnancy may be born prematurely and have lower mental abilities. What are the signs or symptoms? Symptoms of hypothyroidism can develop slowly. Symptoms include:  Fatigue.  Weight gain.  Constipation.  Feeling cold more often than others do.  Muscle aches. How is this diagnosed? Your health care provider may suspect hypothyroidism based on your symptoms. The health care provider will also do a physical exam to check your neck. He or she will do this while you swallow so that it will be easier to feel your thyroid gland. You may also have tests to confirm the diagnosis, including:  Blood tests.  An imaging study using sound waves and a computer (ultrasound). How is this treated? Hypothyroid treatment during pregnancy includes:  Monitoring. If you have mild hypothyroidism, your health care provider will monitor your thyroid hormone levels closely to watch for any changes.  Medicine prescribed by your health care provider to control your thyroid hormone levels. Follow these instructions at home:  Take medicines only  as directed by your health care provider. Check with your health care provider before taking any hypothyroid medicines that were prescribed before you became pregnant. Many are safe, but some treatments for hypothyroidism may have to be stopped during pregnancy.  Some women need extra iodine during pregnancy. Ask your health care provider whether you should:  Get more iodine in your diet.  Take a prenatal vitamin containing iodine.  Take iodine supplements. Contact a health care provider if:  You notice the onset of hypothyroidism symptoms  that you did not have before.  You gain more than 5 lb (2.3 kg) in 1 week. For women at a normal weight, it is normal to gain about 1 pound per week during pregnancy.  You have a lump in your neck.  You have a scratchy throat or difficulty speaking that lasts longer than a month and is not related to a cold.  You have a hard time swallowing. Get help right away if:  Your baby is less active than normal. You may be asked to perform kick counts to monitor your baby's movements. If your baby moves fewer than 10 times in 2 hours during a period when the baby is usually active (typically in the evening), you should see your health care provider right away.  Your baby stops moving completely.  You develop muscle cramps.  You have belly pain.  You have heavy bleeding.  You develop a fever or chills.  You have a very bad headache or vision problems.  You develop swelling in your legs and ankles. This information is not intended to replace advice given to you by your health care provider. Make sure you discuss any questions you have with your health care provider. Document Released: 04/25/2007 Document Revised: 12/04/2015 Document Reviewed: 11/28/2013 Elsevier Interactive Patient Education  2017 [employer].  Pregnancy and Sex Your sex life may change during pregnancy as well as after your newborn arrives. It is normal to have questions about sex during pregnancy. All women are affected differently by pregnancy hormones. You may notice an increase or decrease in your sexual drive throughout your pregnancy. Also, your partner's attitude and sexual drive may change. Share the information in this document with your partner. Talk openly about how you feel about sex. When is it safe to have sex during pregnancy? Sex is generally considered safe throughout a normal low-risk pregnancy. Remember:  The fetus is protected by the uterus and the fluid-filled sac that surrounds the fetus (amniotic  sac).  The cervix is closed or sealed during pregnancy.  The penis does not reach or harm the fetus during sex.  Sex and orgasms are not thought to cause miscarriages or early labor.  If you use lubricants, use a water-soluble product. What risk factors make it unsafe to have sex while pregnant? The following complications or risk factors may make it necessary to limit sexual activity:  You have a history of miscarriage or preterm labor.  You have bleeding, discharge, fluid leakage, or contractions.  Your placenta may be partially covering or completely covering the opening to the cervix (placenta previa).  Your cervix is weak and opens easily (incompetent cervix).  Your partner has an STD (sexually transmitted disease). Avoid sex with the infected person or use a condom to prevent infection to the fetus.  You are unsure of your partner's sexual history. Avoid sex or use condoms.  You are having twins, triples, or other multiples. Your health care provider will help you determine whether  sex during your pregnancy is safe. What practices are unsafe?  If you engage in oral sex, you should avoid having your partner blow air into your vagina. Although very rare, this can send a dangerous air bubble into your bloodstream.  Anal sex is generally safe during pregnancy, but there can be a risk of spreading bacteria from the rectum and aggravating any hemorrhoids. This information is not intended to replace advice given to you by your health care provider. Make sure you discuss any questions you have with your health care provider. Document Released: 12/16/2009 Document Revised: 02/24/2016 Document Reviewed: 12/18/2015 Elsevier Interactive Patient Education  2017 [employer].  Type 1 or Type 2 Diabetes Mellitus During Pregnancy, Self Care Caring for yourself during your pregnancy when you have type 1 diabetes (type 1 diabetes mellitus) or type 2 diabetes (type 2 diabetes mellitus) means  keeping your blood sugar (glucose) under control with a balance of:  Nutrition.  Exercise.  Lifestyle changes.  Insulin or medicines, if necessary.  Support from your team of health care providers and others. The following information explains what you need to know to manage your diabetes at home during your pregnancy. What do I need to do to manage my blood glucose?  Check your blood glucose every day, as often as told by your health care provider.  Contact your health care provider if your blood glucose is above your target for 2 tests in a row.  Have your A1c (hemoglobin A1c) level checked at least two times a year, or as often as told by your health care provider. Your health care provider will set individualized treatment goals for you. Generally, the goal of treatment is to maintain the following blood glucose levels during pregnancy:  After not eating (after fasting) for 8 hours: at or below 95 mg/dL (5.3 mmol/L).  After meals (postprandial):  One hour after a meal: at or below 140 mg/dL (7.8 mmol/L).  Two hours after a meal: at or below 120 mg/dL (6.7 mmol/L).  A1c level: 6-6.5% What do I need to know about hyperglycemia and hypoglycemia? What is hyperglycemia? Hyperglycemia, also called high blood glucose, occurs when blood glucose is too high. Make sure you know the early signs of hyperglycemia, such as:  Increased thirst.  Hunger.  Feeling very tired.  Needing to urinate more often than usual.  Blurry vision. What is hypoglycemia?  Hypoglycemia, also called low blood glucose, occurs with a blood glucose level at or below 70 mg/dL (3.9 mmol/L). The risk for hypoglycemia increases during or after exercise, during sleep, during illness, and when skipping meals or fasting. It is important to know the symptoms of hypoglycemia and treat it right away. Always have a 15-gram rapid-acting carbohydrate snack with you to treat low blood glucose. Family members and close  friends should also know the symptoms and should understand how to treat hypoglycemia, in case you are not able to treat yourself. What are the symptoms of hypoglycemia?  Hypoglycemia symptoms can include:  Hunger.  Anxiety.  Sweating and feeling clammy.  Confusion.  Dizziness or feeling light-headed.  Sleepiness.  Nausea.  Increased heart rate.  Headache.  Blurry vision.  Seizure.  Nightmares.  Tingling or numbness around the mouth, lips, or tongue.  A change in speech.  Decreased ability to concentrate.  A change in coordination.  Restless sleep.  Tremors or shakes.  Fainting.  Irritability. How do I treat hypoglycemia?   If you are alert and able to swallow safely, follow  the 15:15 rule:  Take 15 grams of a rapid-acting carbohydrate. Rapid-acting options include:  1 tube of glucose gel.  3 glucose pills.  6-8 pieces of hard candy.  4 oz (120 mL) of fruit juice .  4 oz (120 mL) of regular (not diet) soda.  Check your blood glucose 15 minutes after you take the carbohydrate.  If the repeat blood glucose level is still at or below 70 mg/dL (3.9 mmol/L), take 15 grams of a carbohydrate again.  If your blood glucose level does not increase above 70 mg/dL (3.9 mmol/L) after 3 tries, seek emergency medical care.  After your blood glucose level returns to normal, eat a meal or a snack within 1 hour. How do I treat severe hypoglycemia?  Severe hypoglycemia is when your blood glucose level is at or below 54 mg/dL (3 mmol/L). Severe hypoglycemia is an emergency. Do not wait to see if the symptoms will go away. Get medical help right away. Call your local emergency services (911 in the [place] Do not drive yourself to the hospital. If you have severe hypoglycemia and you cannot eat or drink, you may need an injection of glucagon. A family member or close friend should learn how to check your blood glucose and how to give you a glucagon injection. Ask your  health care provider if you need to have an emergency glucagon injection kit available. Severe hypoglycemia may need to be treated in a hospital. The treatment may include getting glucose through an IV tube. You may also need treatment for the cause of your hypoglycemia. Can having diabetes put me at risk for other conditions? Having diabetes can put you at risk for other long-term (chronic) conditions, such as heart disease and kidney disease. Your health care provider may prescribe medicines to help prevent complications from diabetes. These medicines may include:  Aspirin.  Medicine to lower cholesterol.  Medicine to control blood pressure. What else can I do to manage my diabetes? Take your diabetes medicines as told   If your health care provider prescribed insulin or diabetes medicines, take them every day.  Do not run out of insulin or other diabetes medicines that you take. Plan ahead so you always have these available.  If you use insulin, adjust your dosage based on how physically active you are and what foods you eat. Your health care provider will tell you how to adjust your dosage. Your health care provider may recommend that you take one low-dose aspirin (81 mg) each day to help prevent high blood pressure during pregnancy (preeclampsia or eclampsia). You may be at risk for preeclampsia or eclampsia if:  You had any of the following during a previous pregnancy:  Preeclampsia or eclampsia.  A fetal growth rate that was slower than normal.  An early (preterm) birth.  Separation of the placenta from the uterus (placental abruption).  Fetal loss.  You are pregnant with more than one baby.  You have other medical conditions, such as high blood pressure or an autoimmune disease. Make healthy food choices   The things that you eat and drink affect your blood glucose and your insulin dosage. Making good choices helps to control your diabetes and prevent other health  problems. A healthy meal plan includes eating lean proteins, complex carbohydrates, fresh fruits and vegetables, low-fat dairy products, and healthy fats. Make an appointment to see a diet and nutrition specialist (registered dietitian) to help you create an eating plan that is right for you. Make sure  that you:  Follow instructions from your health care provider about eating or drinking restrictions.  Drink enough fluid to keep your urine clear or pale yellow.  Eat healthy snacks between nutritious meals.  Track the carbohydrates that you eat. Do this by reading food labels and learning the standard serving sizes of foods.  Follow your sick day plan whenever you cannot eat or drink as usual. Make this plan in advance with your health care provider. Stay active    Do at least 30 minutes of physical activity a day, or as much physical activity as your health care provider recommends during your pregnancy.  If you start a new exercise or activity, work with your health care provider to adjust your insulin, medicines, or food intake as needed. Make healthy lifestyle choices   Do not use any tobacco products, such as cigarettes, chewing tobacco, and e-cigarettes. If you need help quitting, ask your health care provider.  Do not use alcohol.  Learn to manage stress. If you need help with this, ask your health care provider. Care for your body   Keep your immunizations up to date.  Schedule an eye exam during your first trimester of your pregnancy, or as told by your health care provider.  Check your skin and feet every day for cuts, bruises, redness, blisters, or sores. Schedule a foot exam with your health care provider once every year.  Brush your teeth and gums two times a day, and floss at least one time a day. Visit your dentist at least once every 6 months.  Maintain a healthy weight during your pregnancy. General instructions    Take over-the-counter and prescription  medicines only as told by your health care provider.  Talk with your health care provider about your risk for high blood pressure during pregnancy (preeclampsia or eclampsia).  Share your diabetes management plan with people in your workplace, school, and household.  Check your urine ketones when you are ill and as told by your health care provider.  Carry a medical alert card or wear medical alert jewelry.  Ask your health care provider:  Do I need to meet with a diabetes educator?  Where can I find a support group for people with diabetes?  Keep all follow-up visits during your pregnancy (prenatal) and after delivery (postnatal) as told by your health care provider. This is important. Where to find more information: For more information about diabetes, visit:  American Diabetes Association (ADA): www.diabetes.org  American Association of Diabetes Educators (AADE): https://www.diabeteseducator.org/patient-resources This information is not intended to replace advice given to you by your health care provider. Make sure you discuss any questions you have with your health care provider. Document Released: 10/20/2015 Document Revised: 12/04/2015 Document Reviewed: 08/01/2015 Elsevier Interactive Patient Education  2017 [employer].

## 2016-11-10 ENCOUNTER — Telehealth: Payer: Self-pay | Admitting: Family Medicine

## 2016-11-10 ENCOUNTER — Encounter: Payer: Self-pay | Admitting: Family Medicine

## 2016-11-10 LAB — COMPREHENSIVE METABOLIC PANEL
A/G RATIO: 1.6 (ref 1.2–2.2)
ALK PHOS: 76 IU/L (ref 39–117)
ALT: 38 IU/L — AB (ref 0–32)
AST: 30 IU/L (ref 0–40)
Albumin: 4.5 g/dL (ref 3.5–5.5)
BILIRUBIN TOTAL: 0.2 mg/dL (ref 0.0–1.2)
BUN/Creatinine Ratio: 16 (ref 9–23)
BUN: 8 mg/dL (ref 6–20)
CO2: 22 mmol/L (ref 18–29)
Calcium: 9.8 mg/dL (ref 8.7–10.2)
Chloride: 97 mmol/L (ref 96–106)
Creatinine, Ser: 0.5 mg/dL — ABNORMAL LOW (ref 0.57–1.00)
GFR calc Af Amer: 157 mL/min/{1.73_m2} (ref >59)
GFR, EST NON AFRICAN AMERICAN: 136 mL/min/{1.73_m2} (ref >59)
GLOBULIN, TOTAL: 2.9 g/dL (ref 1.5–4.5)
GLUCOSE: 105 mg/dL — AB (ref 65–99)
POTASSIUM: 4.4 mmol/L (ref 3.5–5.2)
SODIUM: 139 mmol/L (ref 134–144)
Total Protein: 7.4 g/dL (ref 6.0–8.5)

## 2016-11-10 LAB — CBC WITH DIFFERENTIAL/PLATELET
BASOS ABS: 0 10*3/uL (ref 0.0–0.2)
Basos: 0 %
EOS (ABSOLUTE): 0.2 10*3/uL (ref 0.0–0.4)
Eos: 1 %
HEMOGLOBIN: 12.6 g/dL (ref 11.1–15.9)
Hematocrit: 38.4 % (ref 34.0–46.6)
Immature Grans (Abs): 0 10*3/uL (ref 0.0–0.1)
Immature Granulocytes: 0 %
LYMPHS ABS: 4.7 10*3/uL — AB (ref 0.7–3.1)
Lymphs: 35 %
MCH: 26 pg — AB (ref 26.6–33.0)
MCHC: 32.8 g/dL (ref 31.5–35.7)
MCV: 79 fL (ref 79–97)
MONOS ABS: 0.6 10*3/uL (ref 0.1–0.9)
Monocytes: 4 %
NEUTROS ABS: 8.1 10*3/uL — AB (ref 1.4–7.0)
Neutrophils: 60 %
Platelets: 400 10*3/uL — ABNORMAL HIGH (ref 150–379)
RBC: 4.85 x10E6/uL (ref 3.77–5.28)
RDW: 14.4 % (ref 12.3–15.4)
WBC: 13.6 10*3/uL — AB (ref 3.4–10.8)

## 2016-11-10 LAB — UA/M W/RFLX CULTURE, ROUTINE
Bilirubin, UA: NEGATIVE
Glucose, UA: NEGATIVE
KETONES UA: NEGATIVE
LEUKOCYTES UA: NEGATIVE
NITRITE UA: NEGATIVE
PH UA: 5.5 (ref 5.0–7.5)
RBC UA: NEGATIVE
Specific Gravity, UA: 1.025 (ref 1.005–1.030)
Urobilinogen, Ur: 0.2 mg/dL (ref 0.2–1.0)

## 2016-11-10 LAB — MICROSCOPIC EXAMINATION
Bacteria, UA: NONE SEEN
RBC MICROSCOPIC, UA: NONE SEEN /HPF (ref 0–?)

## 2016-11-10 LAB — TSH: TSH: 4.05 u[IU]/mL (ref 0.450–4.500)

## 2016-11-10 LAB — PREGNANCY, URINE: Preg Test, Ur: POSITIVE — AB

## 2016-11-10 MED ORDER — LEVOTHYROXINE SODIUM 25 MCG PO TABS: ORAL_TABLET | ORAL | 3 refills | Status: DC

## 2016-11-10 NOTE — Telephone Encounter (Signed)
Called Katria with her results. Thyroid off a little- will get her restarted on . Discussed the importance of keeping thyroid under good control while pregnant. Still waiting on A1c result. Call with any concerns.

## 2016-11-15 ENCOUNTER — Ambulatory Visit (INDEPENDENT_AMBULATORY_CARE_PROVIDER_SITE_OTHER): Payer: BLUE CROSS/BLUE SHIELD | Admitting: Obstetrics and Gynecology

## 2016-11-15 ENCOUNTER — Other Ambulatory Visit (INDEPENDENT_AMBULATORY_CARE_PROVIDER_SITE_OTHER): Payer: BLUE CROSS/BLUE SHIELD

## 2016-11-15 VITALS — BP 96/66 | HR 88 | Ht 64.0 in | Wt 272.8 lb

## 2016-11-15 DIAGNOSIS — O24111 Pre-existing diabetes mellitus, type 2, in pregnancy, first trimester: Secondary | ICD-10-CM

## 2016-11-15 DIAGNOSIS — N926 Irregular menstruation, unspecified: Secondary | ICD-10-CM

## 2016-11-15 DIAGNOSIS — Z3687 Encounter for antenatal screening for uncertain dates: Secondary | ICD-10-CM | POA: Diagnosis not present

## 2016-11-15 DIAGNOSIS — Z113 Encounter for screening for infections with a predominantly sexual mode of transmission: Secondary | ICD-10-CM

## 2016-11-15 DIAGNOSIS — Z3401 Encounter for supervision of normal first pregnancy, first trimester: Secondary | ICD-10-CM

## 2016-11-15 DIAGNOSIS — Z1389 Encounter for screening for other disorder: Secondary | ICD-10-CM

## 2016-11-15 DIAGNOSIS — E039 Hypothyroidism, unspecified: Secondary | ICD-10-CM

## 2016-11-15 DIAGNOSIS — O99280 Endocrine, nutritional and metabolic diseases complicating pregnancy, unspecified trimester: Secondary | ICD-10-CM

## 2016-11-15 NOTE — Patient Instructions (Signed)
Pregnancy and Zika Virus Disease Zika virus disease, or Zika, is an illness that can spread to people from mosquitoes that carry the virus. It may also spread from person to person through infected body fluids. Zika first occurred in [place], but recently it has spread to new areas. The virus occurs in tropical climates. The location of Zika continues to change. Most people who become infected with Zika virus do not develop serious illness. However, Zika may cause birth defects in an unborn baby whose mother is infected with the virus. It may also increase the risk of miscarriage. What are the symptoms of Zika virus disease? In many cases, people who have been infected with Zika virus do not develop any symptoms. If symptoms appear, they usually start about a week after the person is infected. Symptoms are usually mild. They may include:  Fever.  Rash.  Red eyes.  Joint pain. How does Zika virus disease spread? The main way that Zika virus spreads is through the bite of a certain type of mosquito. Unlike most types of mosquitos, which bite only at night, the type of mosquito that carries Zika virus bites both at night and during the day. Zika virus can also spread through sexual contact, through a blood transfusion, and from a mother to her baby before or during birth. Once you have had Zika virus disease, it is unlikely that you will get it again. Can I pass Zika to my baby during pregnancy? Yes, Zika can pass from a mother to her baby before or during birth. What problems can Zika cause for my baby? A woman who is infected with Zika virus while pregnant is at risk of having her baby born with a condition in which the brain or head is smaller than expected (microcephaly). Babies who have microcephaly can have developmental delays, seizures, hearing problems, and vision problems. Having Zika virus disease during pregnancy can also increase the risk of miscarriage. How can Zika virus disease be  prevented? There is no vaccine to prevent Zika. The best way to prevent the disease is to avoid infected mosquitoes and avoid exposure to body fluids that can spread the virus. Avoid any possible exposure to Zika by taking the following precautions. For women and their sex partners:  Avoid traveling to high-risk areas. The locations where Zika is being reported change often. To identify high-risk areas, check the CDC travel website: www.cdc.gov/zika/geo/index.html  If you or your sex partner must travel to a high-risk area, talk with a health care provider before and after traveling.  Take all precautions to avoid mosquito bites if you live in, or travel to, any of the high-risk areas. Insect repellents are safe to use during pregnancy.  Ask your health care provider when it is safe to have sexual contact. For women:  If you are pregnant or trying to become pregnant, avoid sexual contact with persons who may have been exposed to Zika virus, persons who have possible symptoms of Zika, or persons whose history you are unsure about. If you choose to have sexual contact with someone who may have been exposed to Zika virus, use condoms correctly during the entire duration of sexual activity, every time. Do not share sexual devices, as you may be exposed to body fluids.  Ask your health care provider about when it is safe to attempt pregnancy after a possible exposure to Zika virus. What steps should I take to avoid mosquito bites? Take these steps to avoid mosquito bites when you are   in a high-risk area:  Wear loose clothing that covers your arms and legs.  Limit your outdoor activities.  Do not open windows unless they have window screens.  Sleep under mosquito nets.  Use insect repellent. The best insect repellents have:  DEET, picaridin, oil of lemon eucalyptus (OLE), or [id number] in them.  Higher amounts of an active ingredient in them.  Remember that insect repellents are safe to use  during pregnancy.  Do not use OLE on children who are younger than 3 years of age. Do not use insect repellent on babies who are younger than 2 months of age.  Cover your child's stroller with mosquito netting. Make sure the netting fits snugly and that any loose netting does not cover your child's mouth or nose. Do not use a blanket as a mosquito-protection cover.  Do not apply insect repellent underneath clothing.  If you are using sunscreen, apply the sunscreen before applying the insect repellent.  Treat clothing with permethrin. Do not apply permethrin directly to your skin. Follow label directions for safe use.  Get rid of standing water, where mosquitoes may reproduce. Standing water is often found in items such as buckets, bowls, animal food dishes, and flowerpots. When you return from traveling to any high-risk area, continue taking actions to protect yourself against mosquito bites for 3 weeks, even if you show no signs of illness. This will prevent spreading Zika virus to uninfected mosquitoes. What should I know about the sexual transmission of Zika? People can spread Zika to their sexual partners during vaginal, anal, or oral sex, or by sharing sexual devices. Many people with Zika do not develop symptoms, so a person could spread the disease without knowing that they are infected. The greatest risk is to women who are pregnant or who may become pregnant. Zika virus can live longer in semen than it can live in blood. Couples can prevent sexual transmission of the virus by:  Using condoms correctly during the entire duration of sexual activity, every time. This includes vaginal, anal, and oral sex.  Not sharing sexual devices. Sharing increases your risk of being exposed to body fluid from another person.  Avoiding all sexual activity until your health care provider says it is safe. Should I be tested for Zika virus? A sample of your blood can be tested for Zika virus. A pregnant  woman should be tested if she may have been exposed to the virus or if she has symptoms of Zika. She may also have additional tests done during her pregnancy, such ultrasound testing. Talk with your health care provider about which tests are recommended. This information is not intended to replace advice given to you by your health care provider. Make sure you discuss any questions you have with your health care provider. Document Released: 03/19/2015 Document Revised: 12/04/2015 Document Reviewed: 03/12/2015 Elsevier Interactive Patient Education  2017 [employer] Hyperemesis Gravidarum Hyperemesis gravidarum is a severe form of nausea and vomiting that happens during pregnancy. Hyperemesis is worse than morning sickness. It may cause you to have nausea or vomiting all day for many days. It may keep you from eating and drinking enough food and liquids. Hyperemesis usually occurs during the first half (the first 20 weeks) of pregnancy. It often goes away once a woman is in her second half of pregnancy. However, sometimes hyperemesis continues through an entire pregnancy. What are the causes? The cause of this condition is not known. It may be related to changes in chemicals (hormones)   in the body during pregnancy, such as the high level of pregnancy hormone (human chorionic gonadotropin) or the increase in the female sex hormone (estrogen). What are the signs or symptoms? Symptoms of this condition include:  Severe nausea and vomiting.  Nausea that does not go away.  Vomiting that does not allow you to keep any food down.  Weight loss.  Body fluid loss (dehydration).  Having no desire to eat, or not liking food that you have previously enjoyed. How is this diagnosed? This condition may be diagnosed based on:  A physical exam.  Your medical history.  Your symptoms.  Blood tests.  Urine tests. How is this treated? This condition may be managed with medicine. If medicines to do not  help relieve nausea and vomiting, you may need to receive fluids through an IV tube at the hospital. Follow these instructions at home:  Take over-the-counter and prescription medicines only as told by your health care provider.  Avoid iron pills and multivitamins that contain iron for the first 3-4 months of pregnancy. If you take prescription iron pills, do not stop taking them unless your health care provider approves.  Take the following actions to help prevent nausea and vomiting:  In the morning, before getting out of bed, try eating a couple of dry crackers or a piece of toast.  Avoid foods and smells that upset your stomach. Fatty and spicy foods may make nausea worse.  Eat 5-6 small meals a day.  Do not drink fluids while eating meals. Drink between meals.  Eat or suck on things that have ginger in them. Ginger can help relieve nausea.  Avoid food preparation. The smell of food can spoil your appetite or trigger nausea.  Follow instructions from your health care provider about eating or drinking restrictions.  For snacks, eat high-protein foods, such as cheese.  Keep all follow-up and pre-birth (prenatal) visits as told by your health care provider. This is important. Contact a health care provider if:  You have pain in your abdomen.  You have a severe headache.  You have vision problems.  You are losing weight. Get help right away if:  You cannot drink fluids without vomiting.  You vomit blood.  You have constant nausea and vomiting.  You are very weak.  You are very thirsty.  You feel dizzy.  You faint.  You have a fever or other symptoms that last for more than 2-3 days.  You have a fever and your symptoms suddenly get worse. Summary  Hyperemesis gravidarum is a severe form of nausea and vomiting that happens during pregnancy.  Making some changes to your eating habits may help relieve nausea and vomiting.  This condition may be managed with  medicine.  If medicines to do not help relieve nausea and vomiting, you may need to receive fluids through an IV tube at the hospital. This information is not intended to replace advice given to you by your health care provider. Make sure you discuss any questions you have with your health care provider. Document Released: 06/28/2005 Document Revised: 02/25/2016 Document Reviewed: 02/25/2016 Elsevier Interactive Patient Education  2017 [employer] First Trimester of Pregnancy The first trimester of pregnancy is from week 1 until the end of week 13 (months 1 through 3). During this time, your baby will begin to develop inside you. At 6-8 weeks, the eyes and face are formed, and the heartbeat can be seen on ultrasound. At the end of 12 weeks, all the baby's   organs are formed. Prenatal care is all the medical care you receive before the birth of your baby. Make sure you get good prenatal care and follow all of your doctor's instructions. Follow these instructions at home: Medicines   Take over-the-counter and prescription medicines only as told by your doctor. Some medicines are safe and some medicines are not safe during pregnancy.  Take a prenatal vitamin that contains at least 600 micrograms (mcg) of folic acid.  If you have trouble pooping (constipation), take medicine that will make your stool soft (stool softener) if your doctor approves. Eating and drinking   Eat regular, healthy meals.  Your doctor will tell you the amount of weight gain that is right for you.  Avoid raw meat and uncooked cheese.  If you feel sick to your stomach (nauseous) or throw up (vomit):  Eat 4 or 5 small meals a day instead of 3 large meals.  Try eating a few soda crackers.  Drink liquids between meals instead of during meals.  To prevent constipation:  Eat foods that are high in fiber, like fresh fruits and vegetables, whole grains, and beans.  Drink enough fluids to keep your pee (urine) clear or  pale yellow. Activity   Exercise only as told by your doctor. Stop exercising if you have cramps or pain in your lower belly (abdomen) or low back.  Do not exercise if it is too hot, too humid, or if you are in a place of great height (high altitude).  Try to avoid standing for long periods of time. Move your legs often if you must stand in one place for a long time.  Avoid heavy lifting.  Wear low-heeled shoes. Sit and stand up straight.  You can have sex unless your doctor tells you not to. Relieving pain and discomfort   Wear a good support bra if your breasts are sore.  Take warm water baths (sitz baths) to soothe pain or discomfort caused by hemorrhoids. Use hemorrhoid cream if your doctor says it is okay.  Rest with your legs raised if you have leg cramps or low back pain.  If you have puffy, bulging veins (varicose veins) in your legs:  Wear support hose or compression stockings as told by your doctor.  Raise (elevate) your feet for 15 minutes, 3-4 times a day.  Limit salt in your food. Prenatal care   Schedule your prenatal visits by the twelfth week of pregnancy.  Write down your questions. Take them to your prenatal visits.  Keep all your prenatal visits as told by your doctor. This is important. Safety   Wear your seat belt at all times when driving.  Make a list of emergency phone numbers. The list should include numbers for family, friends, the hospital, and police and fire departments. General instructions   Ask your doctor for a referral to a local prenatal class. Begin classes no later than at the start of month 6 of your pregnancy.  Ask for help if you need counseling or if you need help with nutrition. Your doctor can give you advice or tell you where to go for help.  Do not use hot tubs, steam rooms, or saunas.  Do not douche or use tampons or scented sanitary pads.  Do not cross your legs for long periods of time.  Avoid all herbs and alcohol.  Avoid drugs that are not approved by your doctor.  Do not use any tobacco products, including cigarettes, chewing tobacco, and electronic cigarettes.   If you need help quitting, ask your doctor. You may get counseling or other support to help you quit.  Avoid cat litter boxes and soil used by cats. These carry germs that can cause birth defects in the baby and can cause a loss of your baby (miscarriage) or stillbirth.  Visit your dentist. At home, brush your teeth with a soft toothbrush. Be gentle when you floss. Contact a doctor if:  You are dizzy.  You have mild cramps or pressure in your lower belly.  You have a nagging pain in your belly area.  You continue to feel sick to your stomach, you throw up, or you have watery poop (diarrhea).  You have a bad smelling fluid coming from your vagina.  You have pain when you pee (urinate).  You have increased puffiness (swelling) in your face, hands, legs, or ankles. Get help right away if:  You have a fever.  You are leaking fluid from your vagina.  You have spotting or bleeding from your vagina.  You have very bad belly cramping or pain.  You gain or lose weight rapidly.  You throw up blood. It may look like coffee grounds.  You are around people who have German measles, fifth disease, or chickenpox.  You have a very bad headache.  You have shortness of breath.  You have any kind of trauma, such as from a fall or a car accident. Summary  The first trimester of pregnancy is from week 1 until the end of week 13 (months 1 through 3).  To take care of yourself and your unborn baby, you will need to eat healthy meals, take medicines only if your doctor tells you to do so, and do activities that are safe for you and your baby.  Keep all follow-up visits as told by your doctor. This is important as your doctor will have to ensure that your baby is healthy and growing well. This information is not intended to replace advice given  to you by your health care provider. Make sure you discuss any questions you have with your health care provider. Document Released: 12/15/2007 Document Revised: 07/06/2016 Document Reviewed: 07/06/2016 Elsevier Interactive Patient Education  2017 [employer] Commonly Asked Questions During Pregnancy  Cats: A parasite can be excreted in cat feces.  To avoid exposure you need to have another person empty the little box.  If you must empty the litter box you will need to wear gloves.  Wash your hands after handling your cat.  This parasite can also be found in raw or undercooked meat so this should also be avoided.  Colds, Sore Throats, Flu: Please check your medication sheet to see what you can take for symptoms.  If your symptoms are unrelieved by these medications please call the office.  Dental Work: Most any dental work your dentist recommends is permitted.  X-rays should only be taken during the first trimester if absolutely necessary.  Your abdomen should be shielded with a lead apron during all x-rays.  Please notify your provider prior to receiving any x-rays.  Novocaine is fine; gas is not recommended.  If your dentist requires a note from us prior to dental work please call the office and we will provide one for you.  Exercise: Exercise is an important part of staying healthy during your pregnancy.  You may continue most exercises you were accustomed to prior to pregnancy.  Later in your pregnancy you will most likely notice you have difficulty with activities   requiring balance like riding a bicycle.  It is important that you listen to your body and avoid activities that put you at a higher risk of falling.  Adequate rest and staying well hydrated are a must!  If you have questions about the safety of specific activities ask your provider.    Exposure to Children with illness: Try to avoid obvious exposure; report any symptoms to us when noted,  If you have chicken pos, red measles or mumps,  you should be immune to these diseases.   Please do not take any vaccines while pregnant unless you have checked with your OB provider.  Fetal Movement: After 28 weeks we recommend you do "kick counts" twice daily.  Lie or sit down in a calm quiet environment and count your baby movements "kicks".  You should feel your baby at least 10 times per hour.  If you have not felt 10 kicks within the first hour get up, walk around and have something sweet to eat or drink then repeat for an additional hour.  If count remains less than 10 per hour notify your provider.  Fumigating: Follow your pest control agent's advice as to how long to stay out of your home.  Ventilate the area well before re-entering.  Hemorrhoids:   Most over-the-counter preparations can be used during pregnancy.  Check your medication to see what is safe to use.  It is important to use a stool softener or fiber in your diet and to drink lots of liquids.  If hemorrhoids seem to be getting worse please call the office.   Hot Tubs:  Hot tubs Jacuzzis and saunas are not recommended while pregnant.  These increase your internal body temperature and should be avoided.  Intercourse:  Sexual intercourse is safe during pregnancy as long as you are comfortable, unless otherwise advised by your provider.  Spotting may occur after intercourse; report any bright red bleeding that is heavier than spotting.  Labor:  If you know that you are in labor, please go to the hospital.  If you are unsure, please call the office and let us help you decide what to do.  Lifting, straining, etc:  If your job requires heavy lifting or straining please check with your provider for any limitations.  Generally, you should not lift items heavier than that you can lift simply with your hands and arms (no back muscles)  Painting:  Paint fumes do not harm your pregnancy, but may make you ill and should be avoided if possible.  Latex or water based paints have less odor  than oils.  Use adequate ventilation while painting.  Permanents & Hair Color:  Chemicals in hair dyes are not recommended as they cause increase hair dryness which can increase hair loss during pregnancy.  " Highlighting" and permanents are allowed.  Dye may be absorbed differently and permanents may not hold as well during pregnancy.  Sunbathing:  Use a sunscreen, as skin burns easily during pregnancy.  Drink plenty of fluids; avoid over heating.  Tanning Beds:  Because their possible side effects are still unknown, tanning beds are not recommended.  Ultrasound Scans:  Routine ultrasounds are performed at approximately 20 weeks.  You will be able to see your baby's general anatomy an if you would like to know the gender this can usually be determined as well.  If it is questionable when you conceived you may also receive an ultrasound early in your pregnancy for dating purposes.  Otherwise ultrasound exams   are not routinely performed unless there is a medical necessity.  Although you can request a scan we ask that you pay for it when conducted because insurance does not cover " patient request" scans.  Work: If your pregnancy proceeds without complications you may work until your due date, unless your physician or employer advises otherwise.  Round Ligament Pain/Pelvic Discomfort:  Sharp, shooting pains not associated with bleeding are fairly common, usually occurring in the second trimester of pregnancy.  They tend to be worse when standing up or when you remain standing for long periods of time.  These are the result of pressure of certain pelvic ligaments called "round ligaments".  Rest, Tylenol and heat seem to be the most effective relief.  As the womb and fetus grow, they rise out of the pelvis and the discomfort improves.  Please notify the office if your pain seems different than that described.  It may represent a more serious condition.   

## 2016-11-15 NOTE — Progress Notes (Signed)
I have reviewed the record and concur with patient management and plan with the exception of  Patient needs to be seen sooner due to h/o Type 2 DM who is now currently off her medications.  Will arrange for patient to be seen within 1 week.   Hildred Laserherry, Kapri Nero, MD [facility]

## 2016-11-15 NOTE — Progress Notes (Signed)
Maria PilgrimBrooke Reyes presents for NOB nurse interview visit. [facility], UPT: positive.  LMP: 09/13/2016, unsure and does have history of irregular menses. Ultrasound for viability and dating ordered. G-1.  P-0000. Pregnancy education material explained and given. No cats in the home. NOB labs ordered. HbgA1c due to Increased BMI and Diabetes, Type 2.  This was not collected on 11/09/16. CBC not repeated from 11/09/2016 by Maria Reyes. Only slightly abnormal. TSH on 11/09/16 was normal at 4.050, so this was not repeated.  HIV labs and Drug screen were explained optional and she did not decline. Drug screen ordered. Pt did mention that she has been around people who smoke marijuana, frequently and wondered about 2nd smoke causing a positive test result. I encouraged pt not to be around this and that I was not sure about it showing up on a drug screen. PNV encouraged. Genetic screening options discussed. Genetic testing: Unsure.  Spina Bifida was noted that it was the pt's father. FOB unsure but thinks there was also some family history with spina bifida.  Pt may discuss with provider. Pt was on Victoza and she stopped this 2 months ago due to side effect of back pain.  Ultrasound today reveals EGA: 6.4 wks, EDD: 07/07/2017. Dating changed and NOB PE.  Pt. To follow up with provider in 6 weeks for NOB physical.  All questions answered.

## 2016-11-16 LAB — MONITOR DRUG PROFILE 14(MW)
Amphetamine Scrn, Ur: NEGATIVE ng/mL
BARBITURATE SCREEN URINE: NEGATIVE ng/mL
BENZODIAZEPINE SCREEN, URINE: NEGATIVE ng/mL
Buprenorphine, Urine: NEGATIVE ng/mL
CANNABINOIDS UR QL SCN: NEGATIVE ng/mL
CREATININE(CRT), U: 146.4 mg/dL (ref 20.0–300.0)
Cocaine (Metab) Scrn, Ur: NEGATIVE ng/mL
Fentanyl, Urine: NEGATIVE pg/mL
MEPERIDINE SCREEN, URINE: NEGATIVE ng/mL
METHADONE SCREEN, URINE: NEGATIVE ng/mL
OXYCODONE+OXYMORPHONE UR QL SCN: NEGATIVE ng/mL
Opiate Scrn, Ur: NEGATIVE ng/mL
PH UR, DRUG SCRN: 5.2 (ref 4.5–8.9)
Phencyclidine Qn, Ur: NEGATIVE ng/mL
Propoxyphene Scrn, Ur: NEGATIVE ng/mL
SPECIFIC GRAVITY: 1.027
Tramadol Screen, Urine: NEGATIVE ng/mL

## 2016-11-16 LAB — RPR: RPR: NONREACTIVE

## 2016-11-16 LAB — MICROSCOPIC EXAMINATION

## 2016-11-16 LAB — HIV ANTIBODY (ROUTINE TESTING W REFLEX): HIV SCREEN 4TH GENERATION: NONREACTIVE

## 2016-11-16 LAB — URINALYSIS, ROUTINE W REFLEX MICROSCOPIC
Bilirubin, UA: NEGATIVE
Ketones, UA: NEGATIVE
LEUKOCYTES UA: NEGATIVE
Nitrite, UA: NEGATIVE
PH UA: 5.5 (ref 5.0–7.5)
RBC, UA: NEGATIVE
Specific Gravity, UA: 1.028 (ref 1.005–1.030)
Urobilinogen, Ur: 0.2 mg/dL (ref 0.2–1.0)

## 2016-11-16 LAB — HEMOGLOBIN A1C
ESTIMATED AVERAGE GLUCOSE: 174 mg/dL
HEMOGLOBIN A1C: 7.7 % — AB (ref 4.8–5.6)

## 2016-11-16 LAB — ANTIBODY SCREEN: ANTIBODY SCREEN: NEGATIVE

## 2016-11-16 LAB — VARICELLA ZOSTER ANTIBODY, IGG: VARICELLA: 627 {index} (ref >165)

## 2016-11-16 LAB — HEPATITIS B SURFACE ANTIGEN: HEP B S AG: NEGATIVE

## 2016-11-16 LAB — ABO

## 2016-11-16 LAB — NICOTINE SCREEN, URINE: COTININE UR QL SCN: NEGATIVE ng/mL

## 2016-11-16 LAB — RUBELLA SCREEN: Rubella Antibodies, IGG: 13.7 index (ref >0.99)

## 2016-11-16 LAB — RH TYPE: RH TYPE: POSITIVE

## 2016-11-17 ENCOUNTER — Telehealth: Payer: Self-pay

## 2016-11-17 LAB — CULTURE, OB URINE

## 2016-11-17 LAB — URINE CULTURE, OB REFLEX

## 2016-11-17 LAB — GC/CHLAMYDIA PROBE AMP
CHLAMYDIA, DNA PROBE: NEGATIVE
Neisseria gonorrhoeae by PCR: NEGATIVE

## 2016-11-17 NOTE — Telephone Encounter (Signed)
-----   Message from Hildred LaserAnika Cherry, MD sent at 11/15/2016 10:57 PM EDT ----- Regarding: patient appointment Maria Reyes,   I see in your note for the patient to be seen in 6 weeks for NOB physical, however patient probably needs to come back in 1 week to begin an insulin regimen as she has not been taking her prior diabetes medication due to establishment of the pregnancy.  Also, can we get [facility] to order a free T3 and T4 to her TSH that was performed today as she has a h/o thyroid disease and is on medication.

## 2016-11-17 NOTE — Telephone Encounter (Addendum)
Left message to contact office and that she needs an appt with Dr. Valentino Saxonherry this week. We have 2 openings on Friday.  On 11/16/2016, labs added to previous blood work: free T3 and T4 and TSH.

## 2016-11-17 NOTE — Telephone Encounter (Signed)
Pt returns call and scheduled appt at 1:30pm on Friday with Dr. Valentino Saxonherry.

## 2016-11-18 LAB — SPECIMEN STATUS REPORT

## 2016-11-18 LAB — TSH PREGNANCY: TSH Pregnancy: 4.49 u[IU]/mL (ref 0.450–4.500)

## 2016-11-18 LAB — T4, FREE: Free T4: 1.41 ng/dL (ref 0.82–1.77)

## 2016-11-18 LAB — T3, FREE: T3, Free: 3.3 pg/mL (ref 2.0–4.4)

## 2016-11-19 ENCOUNTER — Ambulatory Visit (INDEPENDENT_AMBULATORY_CARE_PROVIDER_SITE_OTHER): Payer: BLUE CROSS/BLUE SHIELD | Admitting: Obstetrics and Gynecology

## 2016-11-19 VITALS — BP 130/79 | HR 89 | Wt 274.1 lb

## 2016-11-19 DIAGNOSIS — O24311 Unspecified pre-existing diabetes mellitus in pregnancy, first trimester: Secondary | ICD-10-CM | POA: Diagnosis not present

## 2016-11-19 MED ORDER — INSULIN NPH (HUMAN) (ISOPHANE) 100 UNIT/ML ~~LOC~~ SUSP: 45.0000 [IU] | Freq: Every day | SUBCUTANEOUS | 11 refills | Status: DC

## 2016-11-19 MED ORDER — INSULIN NPH (HUMAN) (ISOPHANE) 100 UNIT/ML ~~LOC~~ SUSP: 16.0000 [IU] | Freq: Every day | SUBCUTANEOUS | 3 refills | Status: DC

## 2016-11-19 MED ORDER — "INSULIN SYRINGE-NEEDLE U-100 31G X 5/16"" 1 ML MISC": 1.0000 | Freq: Four times a day (QID) | 11 refills | Status: DC

## 2016-11-19 MED ORDER — INSULIN REGULAR HUMAN 100 UNIT/ML IJ SOLN: 16.0000 [IU] | Freq: Every day | INTRAMUSCULAR | 11 refills | Status: DC

## 2016-11-19 MED ORDER — INSULIN REGULAR HUMAN 100 UNIT/ML IJ SOLN: 22.0000 [IU] | Freq: Every day | INTRAMUSCULAR | 11 refills | Status: DC

## 2016-11-19 NOTE — Progress Notes (Signed)
GYNECOLOGY PROGRESS NOTE  Subjective:    Patient ID: Maria Reyes, female    DOB: May 26, 1991, 26 y.o.   MRN: [id number]  HPI  Patient is a 26 y.o. G37P0000 female who is currently at [redacted]w[redacted]d, with Estimated Date of Delivery: 07/07/17 who presents for consultation for pre-existing diabetes in pregnancy (diagnosed 1 year ago).  Also with a h/o hypothyroidism. Patient notes that she was previously on Victoza and Metformin prior to pregnancy, but discontinued approximately 2-3 months prior to discovery of pregnancy due to side effects of medication.  Notes that she did not discuss initiation of any other medications with her PCP.  Has seen an endocrinologist once since the diagnosis of her diabetes.  Reports that she still checks her blood sugars twice daily 2 hrs after meals most days, with glucose levels ranging between 100-145.   OB History  Gravida Para Term Preterm AB Living  1       0    SAB TAB Ectopic Multiple Live Births    0          # Outcome Date GA Lbr Len/2nd Weight Sex Delivery Anes PTL Lv  1 Current               Past Medical History:  Diagnosis Date  . Diabetes mellitus without complication (HCC)    Type Two  . Hypertension   . Hypothyroid   . Ovarian cyst     Family History  Problem Relation Age of Onset  . Endometriosis Mother   . Ovarian cysts Mother   . Diabetes Father   . Heart disease Father   . Diabetes Maternal Grandmother   . Hypertension Maternal Grandmother   . Cancer Maternal Grandmother        Breast  . Diabetes Maternal Grandfather   . Diabetes Paternal Grandmother   . Diabetes Paternal Grandfather     Past Surgical History:  Procedure Laterality Date  . LAPAROSCOPIC OVARIAN CYSTECTOMY      Social History   Social History  . Marital status: Single    Spouse name: N/A  . Number of children: N/A  . Years of education: N/A   Occupational History  . Not on file.   Social History Main Topics  . Smoking status: Never Smoker  .  Smokeless tobacco: Never Used  . Alcohol use Yes     Comment: Occasionally  . Drug use: No  . Sexual activity: Yes    Birth control/ protection: None   Other Topics Concern  . Not on file   Social History Narrative  . No narrative on file    Current Outpatient Prescriptions on File Prior to Visit  Medication Sig Dispense Refill  . levothyroxine (SYNTHROID, LEVOTHROID) 25 MCG tablet TAKE 1 TABLET(50 MCG) BY MOUTH DAILY BEFORE BREAKFAST 30 tablet 3  . PRENATAL 27-1 MG TABS Take 1 tablet by mouth daily. 30 each 12   No current facility-administered medications on file prior to visit.     No Known Allergies   Review of Systems A comprehensive review of systems was negative.   Objective:   Blood pressure 130/79, pulse 89, weight 274 lb 1.6 oz (124.3 kg), last menstrual period 09/13/2016. General appearance: alert and no distress   Lungs: clear to auscultation bilaterally Heart: regular rate and rhythm, S1, S2 normal, no murmur, click, rub or gallop Skin: Skin color, texture, turgor normal. No rashes or lesions Abdomen: soft, non-tender; bowel sounds normal; no masses,  no organomegaly Pelvic:  deferred Extremities: extremities normal, atraumatic, no cyanosis or edema Neurologic: Grossly normal   Labs:  Lab Results  Component Value Date   TSH 4.050 11/09/2016    Lab Results  Component Value Date   HGBA1C 7.7 (H) 11/15/2016    Assessment:   Type II DM in pregnancy, currently on no meds.  High risk pregnancy Hypothyroidism Hypertension (pre-existing) in pregnancy  Plan:   - Discussed risks of uncontrolled diabetes on a pregnancy, increased risk of miscarriage, including birth defects (more specifically cardiac, caudal agenesis), risk for developing hypertension in pregnancy, polyhydramnios, fetal macrosomia, inability of fetus to regulate blood sugars at birth, jaundice, and prolonged hospitalization after birth.  Advised that patient needs to begin an insulin  regimen.  Based on calculations from [employer] which included factors of weight and gestational age, patient is to begin the following regimen: 20R/45N the morning, and 16R/16N in the evening.  Discussed checking blood sugars 4 x daily (fasting, and 2 hrs after each meal).  Will also refer patient to [facility] classes as she will need further diabetic teaching and instruction on insulin injection and dietary recommendations during pregnancy.   - She will also need a referral to [facility] for high risk imaging at appropriate gestational age.  Patient had a viability scan earlier this week which confirmed an SIUP, viable, at 6.[redacted]weeks gestation, consistent with dates.  - Will need thyroid levels checked each trimester.  Patient compliant with medications.  - Hypertension pre-existing, currently on no meds.  BPs wnl.  Will continue to monitor and treat as needed.   -RTC in 1 week to reassess blood sugars.  Given hypoglycemic precautions.     A total of 30 minutes were spent face-to-face with the patient during the encounter with greater than 50% dealing with counseling and coordination of care.   Hildred Laserherry, Xitlally Mooneyham, MD [facility]

## 2016-11-19 NOTE — Patient Instructions (Signed)
Calculated Initial Insulin Regimen  Breakfast : Rapid-acting or regular insulin 22 units [place] and NPH insulin 45 units [place] .  Dinner : Rapid-acting or regular insulin 16 units [place].  NPH insulin 16 units [place]   Check blood sugars 4 times daily (before breakfast, and 2 hours after each meal, breakfast, lunch, and dinner).

## 2016-11-23 ENCOUNTER — Telehealth: Payer: Self-pay

## 2016-11-23 DIAGNOSIS — O24111 Pre-existing diabetes mellitus, type 2, in pregnancy, first trimester: Secondary | ICD-10-CM

## 2016-11-23 MED ORDER — INSULIN NPH (HUMAN) (ISOPHANE) 100 UNIT/ML ~~LOC~~ SUSP: 16.0000 [IU] | Freq: Every day | SUBCUTANEOUS | 11 refills | Status: DC

## 2016-11-23 MED ORDER — INSULIN NPH (HUMAN) (ISOPHANE) 100 UNIT/ML ~~LOC~~ SUSP: 45.0000 [IU] | Freq: Every day | SUBCUTANEOUS | 11 refills | Status: DC

## 2016-11-23 MED ORDER — INSULIN REGULAR HUMAN 100 UNIT/ML IJ SOLN: 16.0000 [IU] | Freq: Every day | INTRAMUSCULAR | 11 refills | Status: DC

## 2016-11-23 MED ORDER — INSULIN REGULAR HUMAN 100 UNIT/ML IJ SOLN: 22.0000 [IU] | Freq: Every day | INTRAMUSCULAR | 11 refills | Status: DC

## 2016-11-23 NOTE — Telephone Encounter (Signed)
Pt calls and states that she has been unable to get her insulin as insurance will not covered it, per pharmacy Humulin needs to be changed to NOVOLIN. Per Dr. Valentino Saxonherry ok to switch keep same dosing. New order placed.

## 2016-11-24 ENCOUNTER — Other Ambulatory Visit: Payer: Self-pay | Admitting: Obstetrics and Gynecology

## 2016-11-25 ENCOUNTER — Encounter: Payer: BLUE CROSS/BLUE SHIELD | Admitting: Obstetrics and Gynecology

## 2016-11-27 IMAGING — RF DG UGI W/ KUB
15 of 22 series · 15 of 24 positions shown · non-contrast
Comparison: [HOSPITAL] CT Abdomen and Pelvis
09/09/2010

CLINICAL DATA: 25-year-old female undergoing planning for bariatric
surgery. Morbid obesity. Initial encounter.

EXAM:
UPPER GI SERIES WITH KUB
TECHNIQUE: After obtaining a scout radiograph a routine upper GI series was
performed using barium
FLUOROSCOPY TIME:  Fluoroscopy Time:  2 minutes 18 seconds
Radiation Exposure Index (if provided by the fluoroscopic device):
65.90 mGY
Number of Acquired Spot Images: 0

[Series 1: t abdomen supine · 0.15mm/px · 1 of 1 slices shown]
[im 1/1]
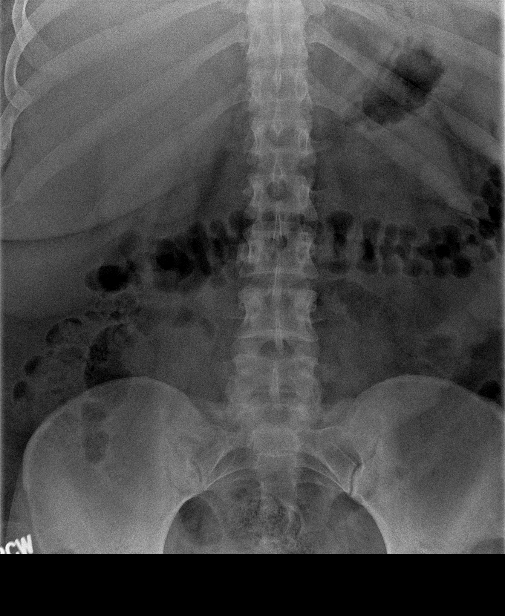

[Series 2: cp_standard · 0.52mm/px · 1 of 32 frames shown (1 of 14)]
[frame 17/32]
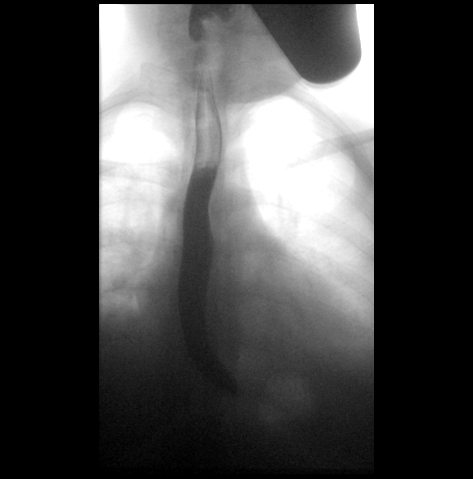

[Series 3: cp_standard · 0.26mm/px · 1 of 1 slices shown (2 of 14)]
[im 1/1]
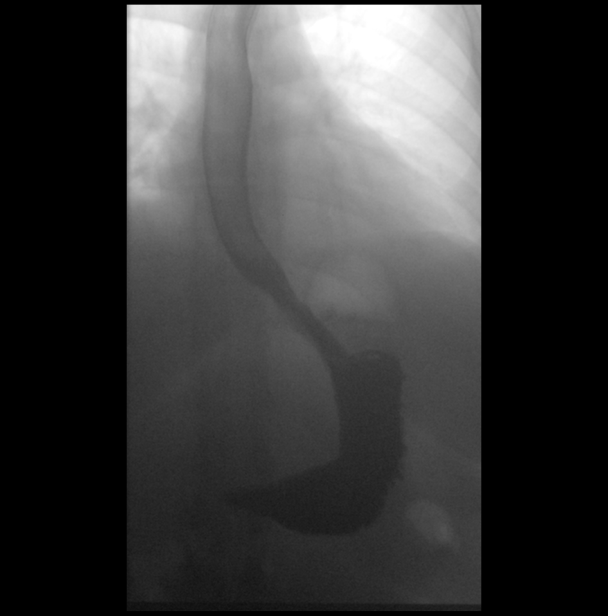

[Series 4: cp_standard · 0.17mm/px · 1 of 1 slices shown (3 of 14)]
[im 1/1]
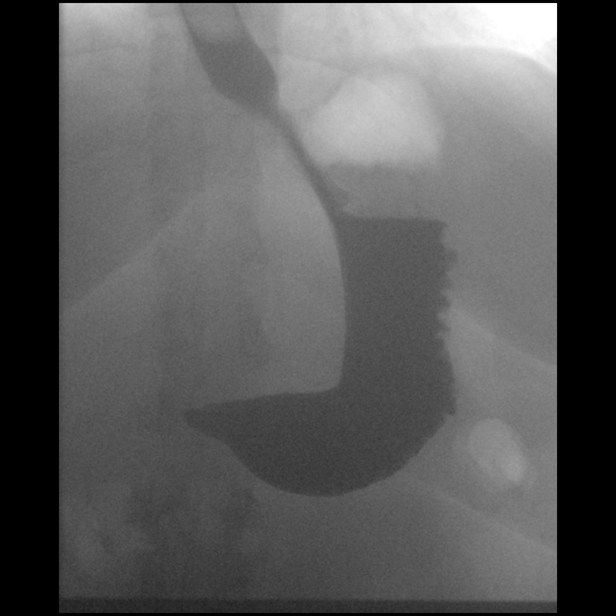

[Series 7: cp_standard · 0.17mm/px · 1 of 1 slices shown (4 of 14)]
[im 1/1]
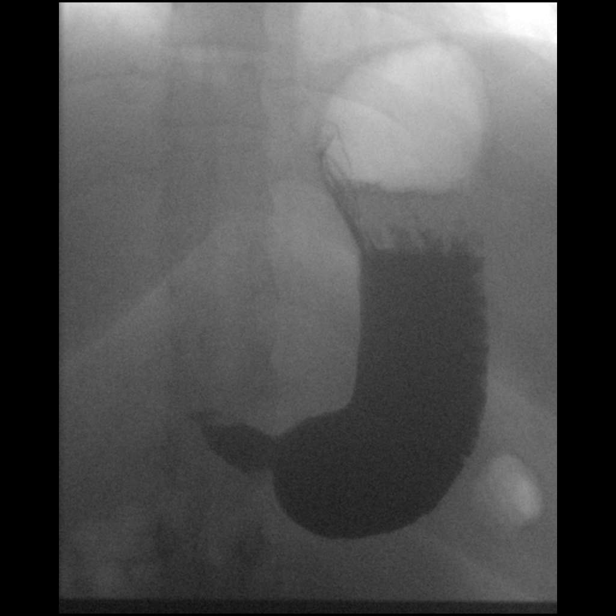

[Series 8: cp_standard · 0.17mm/px · 1 of 1 slices shown (5 of 14)]
[im 1/1]
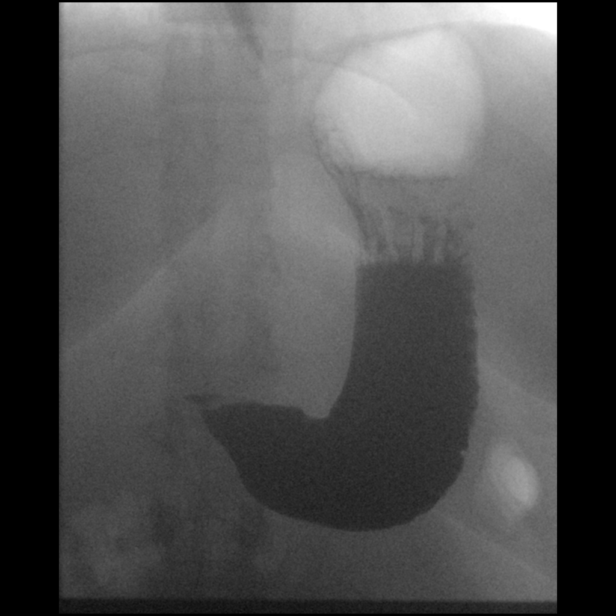

[Series 10: cp_standard · 0.18mm/px · 1 of 1 slices shown (6 of 14)]
[im 1/1]
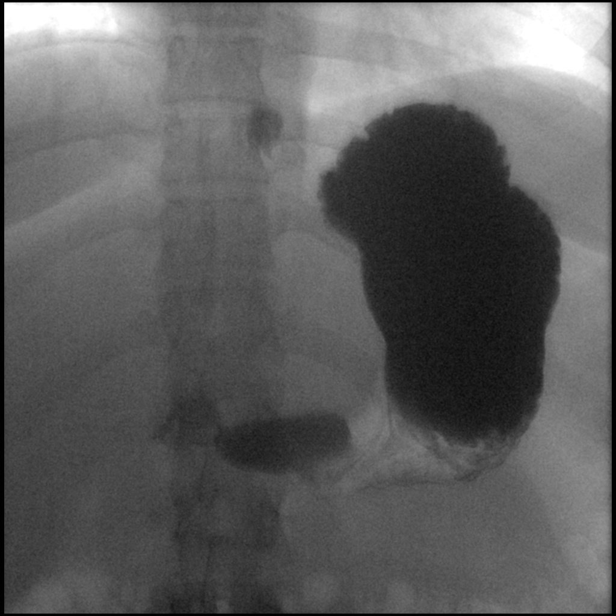

[Series 12: cp_standard · 0.17mm/px · 1 of 1 slices shown (7 of 14)]
[im 1/1]
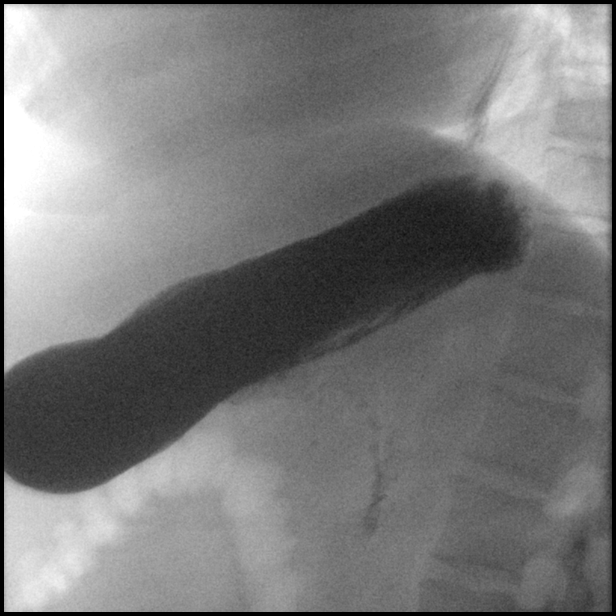

[Series 13: cp_standard · 0.18mm/px · 1 of 1 slices shown (8 of 14)]
[im 1/1]
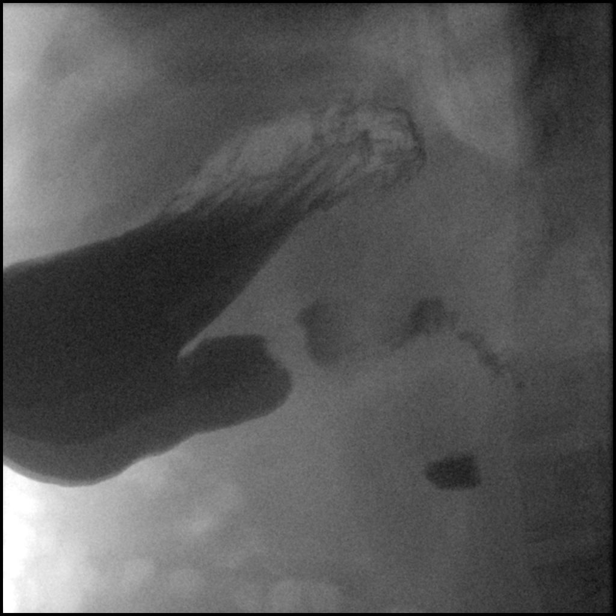

[Series 15: cp_standard · 0.18mm/px · 1 of 1 slices shown (9 of 14)]
[im 1/1]
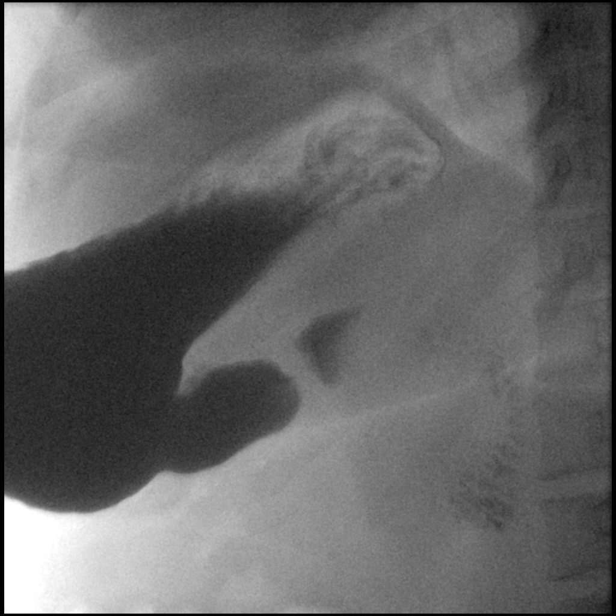

[Series 16: cp_standard · 0.18mm/px · 1 of 1 slices shown (10 of 14)]
[im 1/1]
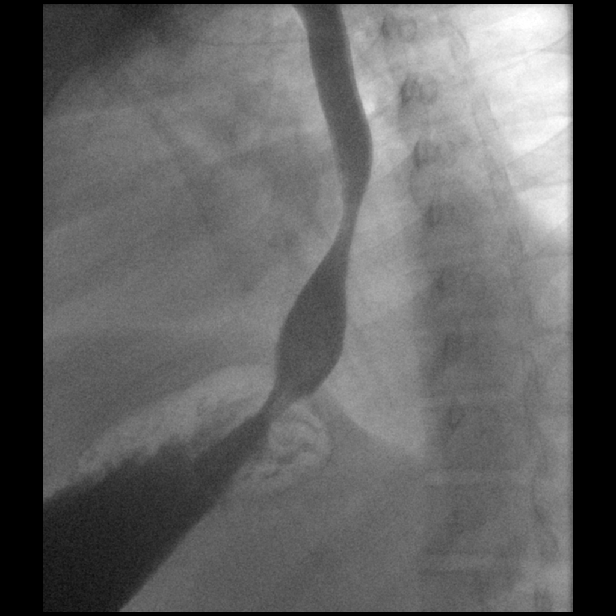

[Series 19: cp_standard · 0.18mm/px · 1 of 1 slices shown (11 of 14)]
[im 1/1]
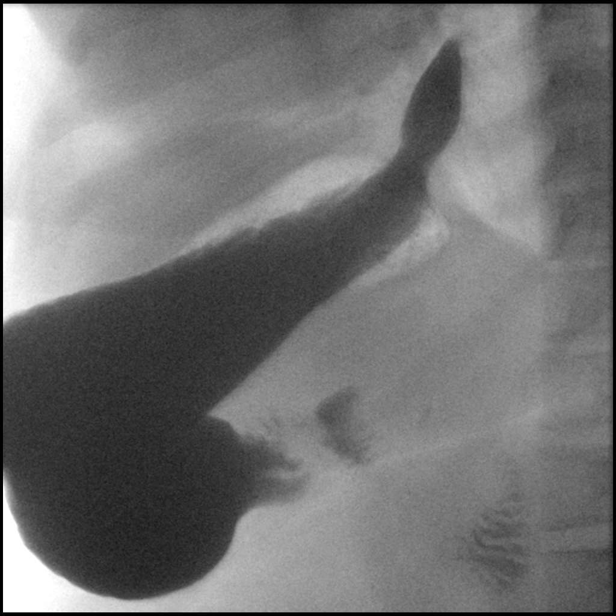

[Series 21: cp_standard · 0.18mm/px · 1 of 1 slices shown (12 of 14)]
[im 1/1]
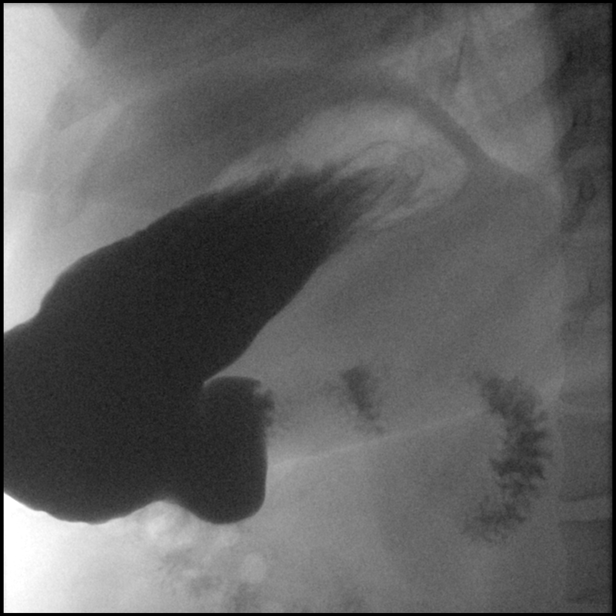

[Series 22: cp_standard · 0.18mm/px · 1 of 1 slices shown (13 of 14)]
[im 1/1]
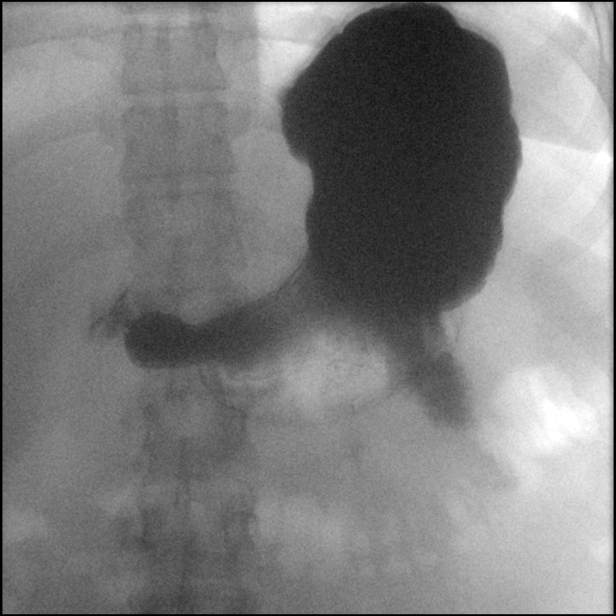

[Series 24: cp_standard · 0.27mm/px · 1 of 1 slices shown (14 of 14)]
[im 1/1]
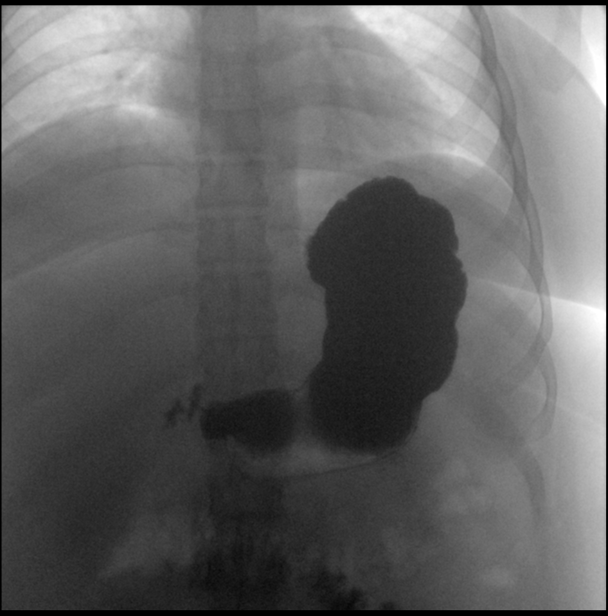

[15 of 24 positions shown; findings below may reference images not displayed]

FINDINGS: Preprocedural scout view of the abdomen. Normal bowel gas pattern.
Normal abdominal visceral contours. No osseous abnormality
identified.

A single contrast study was undertaken and the patient tolerated
this well and without difficulty.

No obstruction to the forward flow of contrast throughout the
esophagus and into the stomach. Normal esophageal course and
contour.

Normal gastroesophageal junction. Normal gastric contour. Gastric
emptying time within normal limits. Normal duodenal C-loop. Normal
ligament of Treitz position. Unremarkable visible proximal small
bowel loops.

No gastroesophageal reflux occurred spontaneously or was elicited.
IMPRESSION: Normal single contrast upper GI.

## 2016-11-30 ENCOUNTER — Encounter: Payer: BLUE CROSS/BLUE SHIELD | Admitting: Obstetrics and Gynecology

## 2016-11-30 DIAGNOSIS — Z3A09 9 weeks gestation of pregnancy: Secondary | ICD-10-CM | POA: Diagnosis not present

## 2016-11-30 DIAGNOSIS — Z79899 Other long term (current) drug therapy: Secondary | ICD-10-CM | POA: Diagnosis not present

## 2016-11-30 DIAGNOSIS — O200 Threatened abortion: Secondary | ICD-10-CM | POA: Diagnosis not present

## 2016-11-30 DIAGNOSIS — E119 Type 2 diabetes mellitus without complications: Secondary | ICD-10-CM | POA: Diagnosis not present

## 2016-11-30 DIAGNOSIS — Z794 Long term (current) use of insulin: Secondary | ICD-10-CM | POA: Insufficient documentation

## 2016-11-30 DIAGNOSIS — O209 Hemorrhage in early pregnancy, unspecified: Secondary | ICD-10-CM | POA: Diagnosis present

## 2016-11-30 DIAGNOSIS — I10 Essential (primary) hypertension: Secondary | ICD-10-CM | POA: Insufficient documentation

## 2016-11-30 LAB — HCG, QUANTITATIVE, PREGNANCY: hCG, Beta Chain, Quant, S: 97452 m[IU]/mL — ABNORMAL HIGH (ref <5)

## 2016-11-30 NOTE — ED Triage Notes (Signed)
Pt states that she woke up at 9:15pm and note some light pink spotting.  Pt states that she just used restroom and that she had a quarter sized amount of blood when she wiped.  Pt also stated that she has been having lower abdominal cramping.  Pt states that she is approx [redacted] weeks pregnant at this time.  Pt in NAD, A&Ox4, ambulatory to triage.

## 2016-12-01 ENCOUNTER — Emergency Department: Payer: BLUE CROSS/BLUE SHIELD

## 2016-12-01 ENCOUNTER — Encounter: Payer: Self-pay | Admitting: Obstetrics and Gynecology

## 2016-12-01 ENCOUNTER — Ambulatory Visit (INDEPENDENT_AMBULATORY_CARE_PROVIDER_SITE_OTHER): Payer: BLUE CROSS/BLUE SHIELD | Admitting: Obstetrics and Gynecology

## 2016-12-01 ENCOUNTER — Ambulatory Visit (INDEPENDENT_AMBULATORY_CARE_PROVIDER_SITE_OTHER): Payer: BLUE CROSS/BLUE SHIELD

## 2016-12-01 ENCOUNTER — Emergency Department
Admission: EM | Admit: 2016-12-01 | Discharge: 2016-12-01 | Disposition: A | Discharge status: Home / Self Care | Payer: BLUE CROSS/BLUE SHIELD | Attending: Emergency Medicine | Admitting: Emergency Medicine

## 2016-12-01 VITALS — BP 126/82 | HR 98 | Ht 64.0 in | Wt 279.9 lb

## 2016-12-01 DIAGNOSIS — R102 Pelvic and perineal pain: Secondary | ICD-10-CM

## 2016-12-01 DIAGNOSIS — O200 Threatened abortion: Secondary | ICD-10-CM

## 2016-12-01 DIAGNOSIS — O24311 Unspecified pre-existing diabetes mellitus in pregnancy, first trimester: Secondary | ICD-10-CM

## 2016-12-01 DIAGNOSIS — O26891 Other specified pregnancy related conditions, first trimester: Secondary | ICD-10-CM | POA: Diagnosis not present

## 2016-12-01 LAB — WET PREP, GENITAL
CLUE CELLS WET PREP: NONE SEEN
Sperm: NONE SEEN
Trich, Wet Prep: NONE SEEN
Yeast Wet Prep HPF POC: NONE SEEN

## 2016-12-01 LAB — CBC
HCT: 38 % (ref 35.0–47.0)
HEMOGLOBIN: 12.4 g/dL (ref 12.0–16.0)
MCH: 25.4 pg — AB (ref 26.0–34.0)
MCHC: 32.5 g/dL (ref 32.0–36.0)
MCV: 78.1 fL — AB (ref 80.0–100.0)
PLATELETS: 375 10*3/uL (ref 150–440)
RBC: 4.86 MIL/uL (ref 3.80–5.20)
RDW: 13.9 % (ref 11.5–14.5)
WBC: 14.9 10*3/uL — ABNORMAL HIGH (ref 3.6–11.0)

## 2016-12-01 LAB — CHLAMYDIA/NGC RT PCR (ARMC ONLY)
Chlamydia Tr: NOT DETECTED
N GONORRHOEAE: NOT DETECTED

## 2016-12-01 LAB — ABO/RH: ABO/RH(D): O POS

## 2016-12-01 NOTE — Progress Notes (Signed)
GYNECOLOGY CLINIC PROGRESS NOTE  Subjective:    Maria Reyes is a 26 y.o. G1P0000 female at 5563w6d who presents for f/u from ER visit earlier this morning for threatened miscarriage.  Patient notes that she presented to the ER overnight for complaints of light pink vaginal spotting that became heavier (like a light period).  States that while seen in the ER, they did an ultrasound and told her "the baby was ok".  Patient states that she was discharged home, where she later had an increase in bleeding with passage of clots, beginning around 5:00 a.m. this morning. Now notes that bleeding has slowed down again to slight trickle.  Denies pelvic cramping but does note some mild pain on right side.  Denies passage of products.   She is not in acute distress. Ectopic risks: none.  Of note, patient notes that she was finally able to pick up her prescription for her insulin.  Has not yet had an appointment with [facility] for diabetic and nutrition teaching.   The following portions of the patient's history were reviewed and updated as appropriate: allergies, current medications, past family history, past medical history, past social history, past surgical history and problem list.  Review of Systems Pertinent items noted in HPI and remainder of comprehensive ROS otherwise negative.   Objective:     BP 126/82 (BP Location: Left Arm, Patient Position: Sitting, Cuff Size: Large)   Pulse 98   Ht 5\' 4"  (1.626 m)   Wt 279 lb 14.4 oz (127 kg)   LMP 09/13/2016 (Within Days)   BMI 48.04 kg/m  General:   alert and no distress, morbidly obese  Heart: regular rate and rhythm, S1, S2 normal, no murmur, click, rub or gallop  Lungs: clear to auscultation bilaterally  Abdomen: soft, non-tender, without masses or organomegaly  Pelvic: Vulva and vagina appear normal. Bimanual exam reveals normal uterus and adnexa.n Scant dark brown blood in vaginal vault.  Cervix closed       Extremities:  extremities normal,  atraumatic, no cyanosis or edema        Neurologic: Grossly normal     Labs:  Results for orders placed or performed during the hospital encounter of 12/01/16  Wet prep, genital  Result Value Ref Range   Yeast Wet Prep HPF POC NONE SEEN NONE SEEN   Trich, Wet Prep NONE SEEN NONE SEEN   Clue Cells Wet Prep HPF POC NONE SEEN NONE SEEN   WBC, Wet Prep HPF POC FEW (A) NONE SEEN   Sperm NONE SEEN   Chlamydia/NGC rt PCR ([facility] only)  Result Value Ref Range   Specimen source GC/Chlam ENDOCERVICAL    Chlamydia Tr NOT DETECTED NOT DETECTED   N gonorrhoeae NOT DETECTED NOT DETECTED  hCG, quantitative, pregnancy  Result Value Ref Range   hCG, Beta Chain, Quant, S 97,452 (H) <5 mIU/mL  CBC  Result Value Ref Range   WBC 14.9 (H) 3.6 - 11.0 K/uL   RBC 4.86 3.80 - 5.20 MIL/uL   Hemoglobin 12.4 12.0 - 16.0 g/dL   HCT 16.138.0 09.635.0 - 04.547.0 %   MCV 78.1 (L) 80.0 - 100.0 fL   MCH 25.4 (L) 26.0 - 34.0 pg   MCHC 32.5 32.0 - 36.0 g/dL   RDW 40.913.9 81.111.5 - 91.414.5 %   Platelets 375 150 - 440 K/uL  ABO/Rh  Result Value Ref Range   ABO/RH(D) O POS       Imaging (11/30/2016) CLINICAL DATA:  Vaginal bleeding for  1 day. Cramping. Estimated gestational age by LMP is 8 weeks 6 days. Quantitative beta HCG is 97,452  EXAM: OBSTETRIC <14 WK [place] AND TRANSVAGINAL OB US  TECHNIQUE: Both transabdominal and transvaginal ultrasound examinations were performed for complete evaluation of the gestation as well as the maternal uterus, adnexal regions, and pelvic cul-de-sac. Transvaginal technique was performed to assess early pregnancy.  COMPARISON:  None.  FINDINGS: Intrauterine gestational sac: A single intrauterine gestational sac is identified. The gestational sac is localized to the lower uterine segment with extension to just above the endocervical canal. Mild funneling is present.  Yolk sac:  Yolk sac is present.  Embryo:  Fetal pole is present.  Cardiac Activity: Fetal cardiac activity is  observed.  Heart Rate: 169  bpm  CRL:  19.7  mm   8 w   4 d                  [place] EDC: 07/09/2017  Subchorionic hemorrhage:  None visualized.  Maternal uterus/adnexae: Uterus is anteverted. No myometrial mass lesions are identified. The right ovary is visualized and appears normal. Left ovary is not identified. No abnormal adnexal masses are seen. No free fluid in the pelvis.  IMPRESSION: A single intrauterine pregnancy is identified. Estimated gestational age by crown-rump length is 8 weeks 4 days. The gestational sac extends into the lower uterine segment. Follow-up is recommended as clinically indicated. Normal fetal cardiac activity is observed.   Assessment:    IUP at 8.[redacted] weeks gestation Threatened abortion   Pelvic pain in pregnancy Type II DM in pregnancy  Plan:    Will repeat ultrasound today in light of patient's history of another large gush of blood after leaving the hosptial to confirm viability. Discussion had regarding suspicions for completion of the spontaneous termintatio of the pregnancy.  Right now it is still a threatened abortion. Discussed limiting activity and resting as much as possible over next few days.  Patient also with mild pain on right side, unable to visualize right ovary on recent ultrasound.  May be able to try again on this ultrasound.  Patient notes that she has finally received her medications. Just initiated new insulin regimen today.  Will f/u at next visit in 2 weeks once viability is confirmed.  Will f/i with [facility] referral for diabetic classes.   Hildred Laser, MD [facility]

## 2016-12-01 NOTE — Discharge Instructions (Signed)
Please follow up with your OB/GYN °

## 2016-12-01 NOTE — ED Notes (Addendum)
Pt reports woke up about 9:15 pm tonight and noticed light pink blood, reports cramping x 3 weeks. States cramping has decreased today. Pt reports is a high risk pregnancy due to diabetes. G1. Denies nausea or vomiting, or vaginal discharge.

## 2016-12-01 NOTE — ED Provider Notes (Signed)
[facility] Emergency Department Provider Note   ____________________________________________   First MD Initiated Contact with Patient 12/01/16 0110     (approximate)  I have reviewed the triage vital signs and the nursing notes.   HISTORY  Chief Complaint Vaginal Bleeding    HPI Ananias PilgrimBrooke Cancio is a 26 y.o. female who comes into the hospital today with some vaginal bleeding. The patient woke up around 9:15 PM from a nap. She reports that she went to use restroom and when she wiped she saw some pink tinged blood on the napkin. She called [facility] was told to come into the hospital for evaluation. She reports that she's had some crampy abdominal pain since she found out she was pregnant a few weeks ago. The patient reports that it comes and goes. She reports that when she arrived here once he is a bathroom she noticed some darker blood but nothing that requires the use of a sanitary napkin. The patient denies any nausea or vomiting. She reports that she has 3 out of 10 burning abdominal discomfort. She also reports that she has left-sided pain but has been told she has a cyst on that side as well. The patient denies any discharge. She is a G1 P0. She is here today for evaluation.   Past Medical History:  Diagnosis Date  . Diabetes mellitus without complication (HCC)    Type Two  . Hypertension   . Hypothyroid   . Ovarian cyst     Patient Active Problem List   Diagnosis Date Noted  . Depression 10/02/2015  . Abnormal TSH 08/20/2015  . Morbid obesity (HCC) 08/19/2015  . Abnormal menstrual cycle 08/19/2015  . DM (diabetes mellitus), type 2 with renal complications (HCC)   . Hypertension   . Ovarian cyst     Past Surgical History:  Procedure Laterality Date  . LAPAROSCOPIC OVARIAN CYSTECTOMY      Prior to Admission medications   Medication Sig Start Date End Date Taking? Authorizing Provider  insulin NPH Human (NOVOLIN N) 100 UNIT/ML injection  Inject 0.16 mLs (16 Units total) into the skin daily before supper. 11/23/16   Hildred Laserherry, Anika, MD  insulin NPH Human (NOVOLIN N) 100 UNIT/ML injection Inject 0.45 mLs (45 Units total) into the skin daily with breakfast. 11/23/16   Hildred Laserherry, Anika, MD  insulin regular (NOVOLIN R) 100 units/mL injection Inject 0.16 mLs (16 Units total) into the skin daily before supper. 11/23/16   Hildred Laserherry, Anika, MD  insulin regular (NOVOLIN R) 100 units/mL injection Inject 0.22 mLs (22 Units total) into the skin daily before breakfast. 11/23/16   Hildred Laserherry, Anika, MD  Insulin Syringe-Needle U-100 31G X 5/16" 1 ML MISC 1 Syringe by Does not apply route 4 (four) times daily. 11/19/16   Hildred Laserherry, Anika, MD  levothyroxine (SYNTHROID, LEVOTHROID) 25 MCG tablet TAKE 1 TABLET(50 MCG) BY MOUTH DAILY BEFORE BREAKFAST 11/10/16   Johnson, Megan P, DO  PRENATAL 27-1 MG TABS Take 1 tablet by mouth daily. 11/09/16   Dorcas CarrowJohnson, Megan P, DO    Allergies Patient has no known allergies.  Family History  Problem Relation Age of Onset  . Endometriosis Mother   . Ovarian cysts Mother   . Diabetes Father   . Heart disease Father   . Diabetes Maternal Grandmother   . Hypertension Maternal Grandmother   . Cancer Maternal Grandmother        Breast  . Diabetes Maternal Grandfather   . Diabetes Paternal Grandmother   . Diabetes  Paternal Grandfather     Social History Social History  Substance Use Topics  . Smoking status: Never Smoker  . Smokeless tobacco: Never Used  . Alcohol use Yes     Comment: Occasionally    Review of Systems  Constitutional: No fever/chills Eyes: No visual changes. ENT: No sore throat. Cardiovascular: Denies chest pain. Respiratory: Denies shortness of breath. Gastrointestinal: Abdominal cramping  No nausea, no vomiting.  No diarrhea.  No constipation. Genitourinary:Vaginal spotting Musculoskeletal: Negative for back pain. Skin: Negative for rash. Neurological: Negative for headaches, focal weakness or  numbness.   ____________________________________________   PHYSICAL EXAM:  VITAL SIGNS: ED Triage Vitals  Enc Vitals Group     BP 11/30/16 2225 (!) 153/76     Pulse Rate 11/30/16 2225 98     Resp 11/30/16 2225 18     Temp 11/30/16 2225 98.2 F (36.8 C)     Temp Source 11/30/16 2225 Oral     SpO2 11/30/16 2225 97 %     Weight 11/30/16 2227 274 lb (124.3 kg)     Height 11/30/16 2227 5\' 4"  (1.626 m)     Head Circumference --      Peak Flow --      Pain Score 11/30/16 2225 7     Pain Loc --      Pain Edu? --      Excl. in GC? --     Constitutional: Alert and oriented. Well appearing and in mild distress. Eyes: Conjunctivae are normal. PERRL. EOMI. Head: Atraumatic. Nose: No congestion/rhinnorhea. Mouth/Throat: Mucous membranes are moist.  Oropharynx non-erythematous. Cardiovascular: Normal rate, regular rhythm. Grossly normal heart sounds.  Good peripheral circulation. Respiratory: Normal respiratory effort.  No retractions. Lungs CTAB. Gastrointestinal: Soft and nontender. No distention. Positive bowel sounds Genitourinary: Normal external genitalia, some mild to moderate blood noticed in the vaginal vault, cervix is closed, no adnexal or uterine tenderness to palpation. Uterus enlarged. Musculoskeletal: No lower extremity tenderness nor edema.   Neurologic:  Normal speech and language.  Skin:  Skin is warm, dry and intact. Marland Kitchen Psychiatric: Mood and affect are normal.   ____________________________________________   LABS (all labs ordered are listed, but only abnormal results are displayed)  Labs Reviewed  WET PREP, GENITAL - Abnormal; Notable for the following:       Result Value   WBC, Wet Prep HPF POC FEW (*)    All other components within normal limits  HCG, QUANTITATIVE, PREGNANCY - Abnormal; Notable for the following:    hCG, Beta Chain, Quant, S 97,452 (*)    All other components within normal limits  CBC - Abnormal; Notable for the following:    WBC 14.9 (*)     MCV 78.1 (*)    MCH 25.4 (*)    All other components within normal limits  CHLAMYDIA/NGC RT PCR ([facility] ONLY)  ABO/RH   ____________________________________________  EKG  none ____________________________________________  RADIOLOGY  US pelvis OB ____________________________________________   PROCEDURES  Procedure(s) performed: None  Procedures  Critical Care performed: No  ____________________________________________   INITIAL IMPRESSION / ASSESSMENT AND PLAN / ED COURSE  Pertinent labs & imaging results that were available during my care of the patient were reviewed by me and considered in my medical decision making (see chart for details).  This is a 26 year old female who comes into the hospital today with some vaginal bleeding. The patient is [redacted] weeks pregnant. I did send the patient for an ultrasound and there is still a fetal heart  rate present. The patient's estimated gestational age is 8 weeks and 4 days. I discussed with the patient that she does have a threatened miscarriage but at this time the pregnancy is still intact. The patient is Rh+ so she does not need a row at this time. The patient's wet prep and GC chlamydia are negative. She'll be discharged home to follow-up with her OB/GYN. The patient has no further questions or concerns. She was concerned about bed rest but I informed her that at this time its normal activities until she follows up with her OB/GYN.  Clinical Course as of Dec 01 616  Wed Dec 01, 2016  0125 A single intrauterine pregnancy is identified. Estimated gestational age by crown-rump length is 8 weeks 4 days. The gestational sac extends into the lower uterine segment. Follow-up is recommended as clinically indicated. Normal fetal cardiac activity is observed.   US OB Comp Less 14 Wks [AW]    Clinical Course User Index [AW] Rebecka Apley, MD     ____________________________________________   FINAL CLINICAL  IMPRESSION(S) / ED DIAGNOSES  Final diagnoses:  Threatened abortion      NEW MEDICATIONS STARTED DURING THIS VISIT:  Discharge Medication List as of 12/01/2016  2:09 AM       Note:  This document was prepared using Dragon voice recognition software and may include unintentional dictation errors.    Rebecka Apley, MD 12/01/16 [phone]

## 2016-12-01 NOTE — ED Notes (Signed)

## 2016-12-02 ENCOUNTER — Encounter: Payer: Self-pay | Admitting: Obstetrics and Gynecology

## 2016-12-07 ENCOUNTER — Encounter: Payer: BLUE CROSS/BLUE SHIELD | Admitting: Obstetrics and Gynecology

## 2016-12-09 ENCOUNTER — Encounter: Payer: BLUE CROSS/BLUE SHIELD | Attending: Obstetrics and Gynecology | Admitting: *Deleted

## 2016-12-09 ENCOUNTER — Encounter: Payer: Self-pay | Admitting: *Deleted

## 2016-12-09 VITALS — BP 124/68 | Ht 64.0 in | Wt 281.9 lb

## 2016-12-09 DIAGNOSIS — O24311 Unspecified pre-existing diabetes mellitus in pregnancy, first trimester: Secondary | ICD-10-CM | POA: Diagnosis not present

## 2016-12-09 DIAGNOSIS — Z6841 Body Mass Index (BMI) 40.0 and over, adult: Secondary | ICD-10-CM | POA: Diagnosis not present

## 2016-12-09 DIAGNOSIS — Z3A00 Weeks of gestation of pregnancy not specified: Secondary | ICD-10-CM | POA: Insufficient documentation

## 2016-12-09 DIAGNOSIS — E119 Type 2 diabetes mellitus without complications: Secondary | ICD-10-CM | POA: Insufficient documentation

## 2016-12-09 DIAGNOSIS — Z713 Dietary counseling and surveillance: Secondary | ICD-10-CM | POA: Insufficient documentation

## 2016-12-09 DIAGNOSIS — O24119 Pre-existing diabetes mellitus, type 2, in pregnancy, unspecified trimester: Secondary | ICD-10-CM

## 2016-12-09 NOTE — Progress Notes (Signed)
Diabetes Self-Management Education  Visit Type: First/Initial  Appt. Start Time: 1315 Appt. End Time: 1430  12/09/2016  [name], identified by name and date of birth, is a 26 y.o. 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   ASSESSMENT  Blood pressure 124/68, height 5\' 4"  (1.626 m), weight 281 lb 14.4 oz (127.9 kg), last menstrual period 09/13/2016. Body mass index is 48.39 kg/m.      Diabetes Self-Management Education - 12/09/16 1439      Visit Information   Visit Type First/Initial     Initial Visit   Diabetes Type Type 2  Complicated by Pregnancy   Are you currently following a meal plan? Yes   What type of meal plan do you follow? less sugar and salt   Are you taking your medications as prescribed? Yes   Date Diagnosed 1 year ago     Health Coping   How would you rate your overall health? Fair     Psychosocial Assessment   Patient Belief/Attitude about Diabetes Other (comment)  "sad and depressed"   Self-care barriers None   Self-management support Doctor's office;Family   Patient Concerns Nutrition/Meal planning;Medication;Glycemic Control;Weight Control;Healthy Lifestyle;Monitoring   Special Needs None   Preferred Learning Style Auditory;Visual;Hands on   Learning Readiness Change in progress   How often do you need to have someone help you when you read instructions, pamphlets, or other written materials from your doctor or pharmacy? 1 - Never   What is the last grade level you completed in school? some college     Pre-Education Assessment   Patient understands the diabetes disease and treatment process. Needs Review   Patient understands incorporating nutritional management into lifestyle. Needs Review   Patient undertands incorporating physical activity into lifestyle. Needs Review   Patient understands using medications safely. Needs Instruction   Patient understands monitoring blood glucose, interpreting and using  results Needs Review   Patient understands prevention, detection, and treatment of acute complications. Needs Review   Patient understands prevention, detection, and treatment of chronic complications. Needs Instruction   Patient understands how to develop strategies to address psychosocial issues. Needs Instruction   Patient understands how to develop strategies to promote health/change behavior. Needs Instruction     Complications   Last HgB A1C per patient/outside source 7.7 %  11/15/16   How often do you check your blood sugar? 3-4 times/day   Fasting Blood glucose range (mg/dL) 96-045;409-811;>914  FBG's 119-216 mg/dL   Postprandial Blood glucose range (mg/dL) 78-295;621-308;657-846;>962  pp's 106-273 mg/dL   Have you had a dilated eye exam in the past 12 months? No   Have you had a dental exam in the past 12 months? No   Are you checking your feet? Yes   How many days per week are you checking your feet? 7     Dietary Intake   Breakfast 4 Eggo waffles; yogurt, whole wheat toast with peanut butter   Snack (morning) cheese crackers   Lunch pasta, burger, wrap and fries   Dinner chicken, green beans, squash, zucchini, potatoes, bread   Snack (evening) sugar free ice cream   Beverage(s) water, juice, unsweetened tea, Crystal light     Exercise   Exercise Type Moderate (swimming / aerobic walking)  water aerobics, walking   How many days per week to you exercise? 3   How many minutes per day do you exercise? 60   Total minutes per week of exercise 180  Patient Education   Previous Diabetes Education No   Disease state  Definition of diabetes, type 1 and 2, and the diagnosis of diabetes   Nutrition management  Role of diet in the treatment of diabetes and the relationship between the three main macronutrients and blood glucose level;Carbohydrate counting;Reviewed blood glucose goals for pre and post meals and how to evaluate the patients' food intake on their blood glucose  level.;Meal timing in regards to the patients' current diabetes medication.   Physical activity and exercise  Role of exercise on diabetes management, blood pressure control and cardiac health.   Medications Taught/reviewed insulin injection, site rotation, insulin storage and needle disposal.;Reviewed patients medication for diabetes, action, purpose, timing of dose and side effects.   Monitoring Purpose and frequency of SMBG.;Taught/discussed recording of test results and interpretation of SMBG.;Ketone testing, when, how.   Acute complications Taught treatment of hypoglycemia - the 15 rule.   Chronic complications Relationship between chronic complications and blood glucose control   Preconception care Reviewed with patient blood glucose goals with pregnancy     Individualized Goals (developed by patient)   Reducing Risk Improve blood sugars Decrease medications Prevent diabetes complications Lose weight Lead a healthier lifestyle Become more fit     Outcomes   Expected Outcomes Demonstrated interest in learning. Expect positive outcomes      Individualized Plan for Diabetes Self-Management Training:   Learning Objective:  Patient will have a greater understanding of diabetes self-management. Patient education plan is to attend individual and/or group sessions per assessed needs and concerns.   Plan:   Patient Instructions  Read booklet on Gestational Diabetes Follow Gestational Meal Planning Guidelines Avoid fruit juices unless treating a low blood sugar Avoid cold cereal for breakfast Always carry fast acting glucose and a snack Complete a 3 Day Food Record and bring to next appointment Check blood sugars 4 x day - before breakfast and 2 hrs after every meal and record  Bring blood sugar log to all appointments Purchase urine ketone strips if directed by MD and check urine ketones every am:  If + increase bedtime snack to 1 protein and 2 carbohydrate servings Walk 20-30  minutes at least 5 x week if permitted by MD Rotate injection sites  Expected Outcomes:  Demonstrated interest in learning. Expect positive outcomes  Education material provided:  Diabetes Management for Mothers-To-Be Booklet Gestational Meal Planning Guidelines Simple Meal Plan 3 Day Food Record Goals for a Healthy Pregnancy Getting Started Booklet (BD) Mixing Insulins (BD) Glucose tablets Symptoms, causes and treatments of Hypoglycemia  If problems or questions, patient to contact team via:  Sharion SettlerSheila Ariza Evans, RN, CCM, CDE [phone]  Future DSME appointment:  December 15, 2016 with dietitian

## 2016-12-09 NOTE — Patient Instructions (Signed)
Read booklet on Gestational Diabetes Follow Gestational Meal Planning Guidelines Avoid fruit juices unless treating a low blood sugar Avoid cold cereal for breakfast Always carry fast acting glucose and a snack Complete a 3 Day Food Record and bring to next appointment Check blood sugars 4 x day - before breakfast and 2 hrs after every meal and record  Bring blood sugar log to all appointments Purchase urine ketone strips if directed by MD and check urine ketones every am:  If + increase bedtime snack to 1 protein and 2 carbohydrate servings Walk 20-30 minutes at least 5 x week if permitted by MD Rotate injection sites

## 2016-12-15 ENCOUNTER — Encounter: Payer: Self-pay | Admitting: Dietician

## 2016-12-15 ENCOUNTER — Encounter: Payer: BLUE CROSS/BLUE SHIELD | Attending: Obstetrics and Gynecology | Admitting: Dietician

## 2016-12-15 ENCOUNTER — Ambulatory Visit (INDEPENDENT_AMBULATORY_CARE_PROVIDER_SITE_OTHER): Payer: BLUE CROSS/BLUE SHIELD | Admitting: Obstetrics and Gynecology

## 2016-12-15 VITALS — Ht 64.0 in | Wt 279.7 lb

## 2016-12-15 VITALS — BP 119/71 | HR 90 | Wt 279.9 lb

## 2016-12-15 DIAGNOSIS — Z3A00 Weeks of gestation of pregnancy not specified: Secondary | ICD-10-CM | POA: Diagnosis not present

## 2016-12-15 DIAGNOSIS — O0991 Supervision of high risk pregnancy, unspecified, first trimester: Secondary | ICD-10-CM

## 2016-12-15 DIAGNOSIS — O99281 Endocrine, nutritional and metabolic diseases complicating pregnancy, first trimester: Secondary | ICD-10-CM

## 2016-12-15 DIAGNOSIS — O24911 Unspecified diabetes mellitus in pregnancy, first trimester: Secondary | ICD-10-CM

## 2016-12-15 DIAGNOSIS — Z6841 Body Mass Index (BMI) 40.0 and over, adult: Secondary | ICD-10-CM | POA: Diagnosis not present

## 2016-12-15 DIAGNOSIS — E039 Hypothyroidism, unspecified: Secondary | ICD-10-CM

## 2016-12-15 DIAGNOSIS — O24311 Unspecified pre-existing diabetes mellitus in pregnancy, first trimester: Secondary | ICD-10-CM | POA: Insufficient documentation

## 2016-12-15 DIAGNOSIS — Z713 Dietary counseling and surveillance: Secondary | ICD-10-CM | POA: Insufficient documentation

## 2016-12-15 DIAGNOSIS — I10 Essential (primary) hypertension: Secondary | ICD-10-CM

## 2016-12-15 DIAGNOSIS — O24119 Pre-existing diabetes mellitus, type 2, in pregnancy, unspecified trimester: Secondary | ICD-10-CM

## 2016-12-15 DIAGNOSIS — Z8659 Personal history of other mental and behavioral disorders: Secondary | ICD-10-CM

## 2016-12-15 DIAGNOSIS — Z124 Encounter for screening for malignant neoplasm of cervix: Secondary | ICD-10-CM

## 2016-12-15 DIAGNOSIS — E119 Type 2 diabetes mellitus without complications: Secondary | ICD-10-CM | POA: Insufficient documentation

## 2016-12-15 DIAGNOSIS — K529 Noninfective gastroenteritis and colitis, unspecified: Secondary | ICD-10-CM

## 2016-12-15 LAB — POCT URINALYSIS DIPSTICK
Bilirubin, UA: NEGATIVE
GLUCOSE UA: NEGATIVE
KETONES UA: NEGATIVE
Leukocytes, UA: NEGATIVE
Nitrite, UA: NEGATIVE
RBC UA: NEGATIVE
Urobilinogen, UA: 0.2 E.U./dL
pH, UA: 5 (ref 5.0–8.0)

## 2016-12-15 NOTE — Progress Notes (Signed)
   Patient's BG record indicates BGs are improving, although still above goal ranges for pregnancy. She will be increasing insulin doses by 2 units for each injection as of today.   Patient's food recall indicates decreased food portions and carbohydrate intake. Her weight has decreased about 2lbs in the past week, but she has also been ill with foodborne illness for the past 2 days.    Provided 1600kcal meal plan, and wrote individualized menus based on patient's food preferences.  Instructed patient on food safety, including avoidance of Listeriosis, and limiting mercury from fish.  Discussed importance of maintaining healthy lifestyle habits to reduce risk of Type 2 DM as well as Gestational DM with any future pregnancies.  Advised patient to use any remaining testing supplies to test some BGs after delivery, and to have BG tested ideally annually, as well as prior to attempting future pregnancies.

## 2016-12-15 NOTE — Patient Instructions (Signed)
Use food lists and menus provided to plan and eat balanced meals.

## 2016-12-15 NOTE — Patient Instructions (Addendum)
Hypertension During Pregnancy Hypertension, commonly called high blood pressure, is when the force of blood pumping through your arteries is too strong. Arteries are blood vessels that carry blood from the heart throughout the body. Hypertension during pregnancy can cause problems for you and your baby. Your baby may be born early (prematurely) or may not weigh as much as he or she should at birth. Very bad cases of hypertension during pregnancy can be life-threatening. Different types of hypertension can occur during pregnancy. These include:  Chronic hypertension. This happens when: ? You have hypertension before pregnancy and it continues during pregnancy. ? You develop hypertension before you are [redacted] weeks pregnant, and it continues during pregnancy.  Gestational hypertension. This is hypertension that develops after the 20th week of pregnancy.  Preeclampsia, also called toxemia of pregnancy. This is a very serious type of hypertension that develops only during pregnancy. It affects the whole body, and it can be very dangerous for you and your baby.  Gestational hypertension and preeclampsia usually go away within 6 weeks after your baby is born. Women who have hypertension during pregnancy have a greater chance of developing hypertension later in life or during future pregnancies. What are the causes? The exact cause of hypertension is not known. What increases the risk? There are certain factors that make it more likely for you to develop hypertension during pregnancy. These include:  Having hypertension during a previous pregnancy or prior to pregnancy.  Being overweight.  Being older than age 107.  Being pregnant for the first time or being pregnant with more than one baby.  Becoming pregnant using fertilization methods such as IVF (in vitro fertilization).  Having diabetes, kidney problems, or systemic lupus erythematosus.  Having a family history of hypertension.  What are the  signs or symptoms? Chronic hypertension and gestational hypertension rarely cause symptoms. Preeclampsia causes symptoms, which may include:  Increased protein in your urine. Your health care provider will check for this at every visit before you give birth (prenatal visit).  Severe headaches.  Sudden weight gain.  Swelling of the hands, face, legs, and feet.  Nausea and vomiting.  Vision problems, such as blurred or double vision.  Numbness in the face, arms, legs, and feet.  Dizziness.  Slurred speech.  Sensitivity to bright lights.  Abdominal pain.  Convulsions.  How is this diagnosed? You may be diagnosed with hypertension during a routine prenatal exam. At each prenatal visit, you may:  Have a urine test to check for high amounts of protein in your urine.  Have your blood pressure checked. A blood pressure reading is recorded as two numbers, such as "120 over 80" (or 120/80). The first ("top") number is called the systolic pressure. It is a measure of the pressure in your arteries when your heart beats. The second ("bottom") number is called the diastolic pressure. It is a measure of the pressure in your arteries as your heart relaxes between beats. Blood pressure is measured in a unit called mm Hg. A normal blood pressure reading is: ? Systolic: below 235. ? Diastolic: below 80.  The type of hypertension that you are diagnosed with depends on your test results and when your symptoms developed.  Chronic hypertension is usually diagnosed before 20 weeks of pregnancy.  Gestational hypertension is usually diagnosed after 20 weeks of pregnancy.  Hypertension with high amounts of protein in the urine is diagnosed as preeclampsia.  Blood pressure measurements that stay above 573 systolic, or above 220 diastolic, are  signs of severe preeclampsia.  How is this treated? Treatment for hypertension during pregnancy varies depending on the type of hypertension you have and how  serious it is.  If you take medicines called ACE inhibitors to treat chronic hypertension, you may need to switch medicines. ACE inhibitors should not be taken during pregnancy.  If you have gestational hypertension, you may need to take blood pressure medicine.  If you are at risk for preeclampsia, your health care provider may recommend that you take a low-dose aspirin every day to prevent high blood pressure during your pregnancy.  If you have severe preeclampsia, you may need to be hospitalized so you and your baby can be monitored closely. You may also need to take medicine (magnesium sulfate) to prevent seizures and to lower blood pressure. This medicine may be given as an injection or through an IV tube.  In some cases, if your condition gets worse, you may need to deliver your baby early.  Follow these instructions at home: Eating and drinking  Drink enough fluid to keep your urine clear or pale yellow.  Eat a healthy diet that is low in salt (sodium). Do not add salt to your food. Check food labels to see how much sodium a food or beverage contains. Lifestyle  Do not use any products that contain nicotine or tobacco, such as cigarettes and e-cigarettes. If you need help quitting, ask your health care provider.  Do not use alcohol.  Avoid caffeine.  Avoid stress as much as possible. Rest and get plenty of sleep. General instructions  Take over-the-counter and prescription medicines only as told by your health care provider.  While lying down, lie on your left side. This keeps pressure off your baby.  While sitting or lying down, raise (elevate) your feet. Try putting some pillows under your lower legs.  Exercise regularly. Ask your health care provider what kinds of exercise are best for you.  Keep all prenatal and follow-up visits as told by your health care provider. This is important. Contact a health care provider if:  You have symptoms that your health care  provider told you may require more treatment or monitoring, such as: ? Fever. ? Vomiting. ? Headache. Get help right away if:  You have severe abdominal pain or vomiting that does not get better with treatment.  You suddenly develop swelling in your hands, ankles, or face.  You gain 4 lbs (1.8 kg) or more in 1 week.  You develop vaginal bleeding, or you have blood in your urine.  You do not feel your baby moving as much as usual.  You have blurred or double vision.  You have muscle twitching or sudden tightening (spasms).  You have shortness of breath.  Your lips or fingernails turn blue. This information is not intended to replace advice given to you by your health care provider. Make sure you discuss any questions you have with your health care provider. Document Released: 03/16/2011 Document Revised: 01/16/2016 Document Reviewed: 12/12/2015 Elsevier Interactive Patient Education  2018 [employer]   Second Trimester of Pregnancy The second trimester is from week 13 through week 28, month 4 through 6. This is often the time in pregnancy that you feel your best. Often times, morning sickness has lessened or quit. You may have more energy, and you may get hungry more often. Your unborn baby (fetus) is growing rapidly. At the end of the sixth month, he or she is about 9 inches long and weighs about  1 pounds. You will likely feel the baby move (quickening) between 18 and 20 weeks of pregnancy. Follow these instructions at home:  Avoid all smoking, herbs, and alcohol. Avoid drugs not approved by your doctor.  Do not use any tobacco products, including cigarettes, chewing tobacco, and electronic cigarettes. If you need help quitting, ask your doctor. You may get counseling or other support to help you quit.  Only take medicine as told by your doctor. Some medicines are safe and some are not during pregnancy.  Exercise only as told by your doctor. Stop exercising if you start  having cramps.  Eat regular, healthy meals.  Wear a good support bra if your breasts are tender.  Do not use hot tubs, steam rooms, or saunas.  Wear your seat belt when driving.  Avoid raw meat, uncooked cheese, and liter boxes and soil used by cats.  Take your prenatal vitamins.  Take 1500-2000 milligrams of calcium daily starting at the 20th week of pregnancy until you deliver your baby.  Try taking medicine that helps you poop (stool softener) as needed, and if your doctor approves. Eat more fiber by eating fresh fruit, vegetables, and whole grains. Drink enough fluids to keep your pee (urine) clear or pale yellow.  Take warm water baths (sitz baths) to soothe pain or discomfort caused by hemorrhoids. Use hemorrhoid cream if your doctor approves.  If you have puffy, bulging veins (varicose veins), wear support hose. Raise (elevate) your feet for 15 minutes, 3-4 times a day. Limit salt in your diet.  Avoid heavy lifting, wear low heals, and sit up straight.  Rest with your legs raised if you have leg cramps or low back pain.  Visit your dentist if you have not gone during your pregnancy. Use a soft toothbrush to brush your teeth. Be gentle when you floss.  You can have sex (intercourse) unless your doctor tells you not to.  Go to your doctor visits. Get help if:  You feel dizzy.  You have mild cramps or pressure in your lower belly (abdomen).  You have a nagging pain in your belly area.  You continue to feel sick to your stomach (nauseous), throw up (vomit), or have watery poop (diarrhea).  You have bad smelling fluid coming from your vagina.  You have pain with peeing (urination). Get help right away if:  You have a fever.  You are leaking fluid from your vagina.  You have spotting or bleeding from your vagina.  You have severe belly cramping or pain.  You lose or gain weight rapidly.  You have trouble catching your breath and have chest pain.  You notice  sudden or extreme puffiness (swelling) of your face, hands, ankles, feet, or legs.  You have not felt the baby move in over an hour.  You have severe headaches that do not go away with medicine.  You have vision changes. This information is not intended to replace advice given to you by your health care provider. Make sure you discuss any questions you have with your health care provider. Document Released: 09/22/2009 Document Revised: 12/04/2015 Document Reviewed: 08/29/2012 Elsevier Interactive Patient Education  2017 [employer]   Type 1 or Type 2 Diabetes Mellitus During Pregnancy, Diagnosis Type 1 diabetes (type 1 diabetes mellitus) and type 2 diabetes (type 2 diabetes mellitus) are long-term (chronic) diseases. In type 1 diabetes, the pancreas does not make enough of a hormone called insulin. In Type 2 diabetes, one or both of these problems  may be present:  The pancreas does not make enough insulin.  Cells in the body do not respond properly to insulin that the body makes (insulin resistance).  Normally, insulin allows sugars (glucose) to enter cells in the body. The cells use glucose for energy. Insulin resistance or lack of insulin causes excess glucose to build up in the blood instead of going into cells. As a result, high blood glucose (hyperglycemia) develops. What increases the risk? You may be more likely to develop diabetes if you have a family history of diabetes. Other factors may make you more likely to develop type 1 diabetes, such as:  Having a gene for type 1 diabetes that is passed along from parent to child (inherited).  Living in an area that has cold weather conditions.  Exposure to certain viruses.  Certain conditions in which the body's disease-fighting system attacks itself (autoimmune disorders).  Other factors may make you more likely to develop type 2 diabetes, such as:  Being overweight or obese.  An inactive (sedentary) lifestyle.  Having a  history of: ? Prediabetes. ? Gestational diabetes. ? Polycystic ovarian syndrome (PCOS).  Being of American-Indian, African-American, Hispanic/Latino, or Asian/Pacific Islander descent.  What are the signs or symptoms? Some of the symptoms of diabetes may be similar to normal symptoms of pregnancy. Symptoms of type 1 or type 2 diabetes may include:  Increased thirst (polydipsia).  Increased urination (polyuria).  Increased hunger (polyphagia).  Increased urination during the night (nocturia).  Sudden or unexplained weight changes.  Frequent infections that keep coming back (recurring).  Fatigue.  Weakness.  Vision changes, such as blurry vision.  Fruity-smelling breath.  Cuts or bruises that are slow to heal.  Tingling or numbness in the hands or feet.  Dark patches on the skin (acanthosis nigricans).  How is this diagnosed? Diabetes is diagnosed based on your symptoms, your medical history, a physical exam, and blood glucose level. Your blood glucose level may be checked with one or more of the following blood tests:  A fasting blood glucose (FBG) test. You will not be allowed to eat (you will fast) for at least 8 hours before a blood sample is taken.  A random blood glucose test. This checks your blood glucose at any time of day regardless of when you ate.  An A1c (hemoglobin A1c) blood test. This provides information about blood glucose control over the previous 2-3 months.  You may be diagnosed with diabetes if:  Your FBG level is 126 mg/dL (7.0 mmol/L) or higher.  Your random blood glucose level is 200 mg/dL (16.111.1 mmol/L) or higher.  Your A1c level is 6.5% or higher.  These blood tests may be repeated to confirm your diagnosis. If you have risk factors for diabetes, you may be screened for diabetes at your first health care visit during your pregnancy (prenatal visit). How is this treated? Your treatment may be managed by a specialist called an  endocrinologist. Your health care provider may treat your diabetes by instructing you to:  Take insulin or other medicines daily. This helps to keep your blood glucose levels in the healthy range. ? If you use insulin, you may need to adjust your dosage based on how physically active you are and what foods you eat. Your health care provider will tell you how to do this.  Check your blood glucose. Do this as often as told.  Make diet and lifestyle changes. These may include: ? Following an individualized nutrition plan that is developed  by a diet and nutrition specialist (registered dietitian). ? Exercising regularly. ? Finding ways to manage stress.  Take medicines to help prevent complications from diabetes, such as: ? Aspirin. ? Medicine to lower cholesterol. ? Medicine to control blood pressure.  Your health care provider will set individualized treatment goals for you. Generally, the goal of treatment is to maintain the following blood glucose levels during pregnancy:  Fasting: at or below 95 mg/dL (5.3 mmol/L).  After meals (postprandial): ? One hour after a meal: at or below 140 mg/dL (7.8 mmol/L). ? Two hours after a meal: at or below 120 mg/dL (6.7 mmol/L).  A1c level: 6-6.5%.  Follow these instructions at home: Questions to ask your health care provider  Consider asking the following questions:  Do I need to meet with a diabetes educator?  Where can I find a support group for people with diabetes?  What equipment will I need to manage my diabetes at home?  What diabetes medicines do I need, and when should I take them?  How often do I need to check my blood glucose?  What number can I call if I have questions?  When is my next appointment?  General instructions  Take over-the-counter and prescription medicines only as told by your health care provider.  Manage your weight gain during pregnancy. The amount of weight that you are expected to gain depends on  your pre-pregnancy BMI (body mass index).  Keep all follow-up visits as told by your health care provider. This is important.  For more information about diabetes, visit: ? American Diabetes Association (ADA): www.diabetes.org ? American Association of Diabetes Educators (AADE): www.diabeteseducator.org/patient-resources Contact a health care provider if:  Your blood glucose is at or above 240 mg/dL (16.1 mmol/L).  Your blood glucose is at or above 200 mg/dL (09.6 mmol/L) and you have ketones in your urine.  You have been sick or have had a fever for 2 days or more and you are not getting better.  You have any of the following problems for more than 6 hours: ? You cannot eat or drink. ? You have nausea and vomiting. ? You have diarrhea. Get help right away if:  Your blood glucose is at or below 54 mg/dL (3 mmol/L).  You become confused or you have trouble thinking clearly.  You have difficulty breathing.  You have moderate or large ketone levels in your urine.  Your baby is moving around less than usual.  You develop unusual discharge or bleeding from your vagina.  You start having contractions early (prematurely). Contractions may feel like a tightening in your lower abdomen. What increases the risk? If diabetes is treated, it is unlikely to cause problems. If it is not controlled with treatment, it may cause problems during labor and delivery, and some of those problems can be harmful to the unborn baby (fetus) and the mother. Uncontrolled diabetes may also cause the newborn baby to have breathing problems and low blood glucose. This information is not intended to replace advice given to you by your health care provider. Make sure you discuss any questions you have with your health care provider. Document Released: 03/22/2012 Document Revised: 12/04/2015 Document Reviewed: 08/01/2015 Elsevier Interactive Patient Education  [employer].

## 2016-12-15 NOTE — Progress Notes (Signed)
OBSTETRIC INITIAL PRENATAL VISIT  Subjective:    Maria Reyes is being seen today for her first obstetrical visit.  This is not a planned pregnancy. She is a G1P0000 female at 1658w6d gestation, Estimated Date of Delivery: 07/07/17 with Patient's last menstrual period was 09/13/2016 (within days). (consistent with 6 week sono). Her obstetrical history is significant for h/o depression, obesity and DM II, hypothyroidism, cHTN (not requiring meds). Relationship with FOB: significant other, not living together. Patient does intend to breast feed. Pregnancy history fully reviewed.    Obstetric History   G1   P0   T0   P0   A0   L0    SAB0   TAB0   Ectopic0   Multiple0   Live Births0     # Outcome Date GA Lbr Len/2nd Weight Sex Delivery Anes PTL Lv  1 Current               Gynecologic History:  Last pap smear was patient cannot recall if she has ever had a pap smear.   Denies history of STIs.    Past Medical History:  Diagnosis Date  . Diabetes mellitus without complication (HCC)    Type Two  . Hypertension   . Hypothyroid   . Ovarian cyst      Family History  Problem Relation Age of Onset  . Endometriosis Mother   . Ovarian cysts Mother   . Diabetes Father   . Heart disease Father   . Diabetes Maternal Grandmother   . Hypertension Maternal Grandmother   . Cancer Maternal Grandmother        Breast  . Diabetes Maternal Grandfather   . Diabetes Paternal Grandmother   . Diabetes Paternal Grandfather      Past Surgical History:  Procedure Laterality Date  . LAPAROSCOPIC OVARIAN CYSTECTOMY       Social History   Social History  . Marital status: Single    Spouse name: N/A  . Number of children: N/A  . Years of education: N/A   Occupational History  . Not on file.   Social History Main Topics  . Smoking status: Never Smoker  . Smokeless tobacco: Never Used  . Alcohol use No     Comment: Occasionally - not during pregnancy  . Drug use: No  . Sexual  activity: Yes    Birth control/ protection: None   Other Topics Concern  . Not on file   Social History Narrative  . No narrative on file     Current Outpatient Prescriptions on File Prior to Visit  Medication Sig Dispense Refill  . insulin NPH Human (NOVOLIN N) 100 UNIT/ML injection Inject 0.16 mLs (16 Units total) into the skin daily before supper. 10 mL 11  . insulin NPH Human (NOVOLIN N) 100 UNIT/ML injection Inject 0.45 mLs (45 Units total) into the skin daily with breakfast. 10 mL 11  . insulin regular (NOVOLIN R) 100 units/mL injection Inject 0.16 mLs (16 Units total) into the skin daily before supper. 10 mL 11  . insulin regular (NOVOLIN R) 100 units/mL injection Inject 0.22 mLs (22 Units total) into the skin daily before breakfast. 10 mL 11  . Insulin Syringe-Needle U-100 31G X 5/16" 1 ML MISC 1 Syringe by Does not apply route 4 (four) times daily. 90 each 11  . levothyroxine (SYNTHROID, LEVOTHROID) 25 MCG tablet TAKE 1 TABLET(50 MCG) BY MOUTH DAILY BEFORE BREAKFAST (Patient taking differently: Take 25 mcg by mouth daily before breakfast. TAKE 1  TABLET(50 MCG) BY MOUTH DAILY BEFORE BREAKFAST) 30 tablet 3  . PRENATAL 27-1 MG TABS Take 1 tablet by mouth daily. 30 each 12   No current facility-administered medications on file prior to visit.      No Known Allergies    Review of Systems General:Not Present- Fever, Weight Loss and Weight Gain. Present - Fatigue Skin:Not Present- Rash. HEENT:Not Present- Blurred Vision, Headache and Bleeding Gums. Respiratory:Not Present- Difficulty Breathing. Breast:Not Present- Breast Mass. Cardiovascular:Not Present- Chest Pain, Elevated Blood Pressure, Fainting / Blacking Out and Shortness of Breath. Gastrointestinal:Present - Nausea, vomiting, and diarrhea x 3 days (just starting to resolve. possibly food poisoning as FOB also experiencing symptoms).  Not Present- Abdominal Pain, Constipation, Female Genitourinary:Not Present-  Frequency, Painful Urination, Pelvic Pain, Vaginal Bleeding, Vaginal Discharge, Contractions, regular, Fetal Movements Decreased, Urinary Complaints and Vaginal Fluid. Musculoskeletal:Not Present- Back Pain and Leg Cramps. Neurological:Not Present- Dizziness. Psychiatric:Not Present- Depression.     Objective:   Blood pressure 119/71, pulse 90, weight 279 lb 14.4 oz (127 kg), last menstrual period 09/13/2016.  Body mass index is 48.04 kg/m.   General Appearance:    Alert, cooperative, no distress, appears stated age, morbidly obese  Head:    Normocephalic, without obvious abnormality, atraumatic  Eyes:    PERRL, conjunctiva/corneas clear, EOM's intact, both eyes  Ears:    Normal external ear canals, both ears  Nose:   Nares normal, septum midline, mucosa normal, no drainage or sinus tenderness  Throat:   Lips, mucosa, and tongue normal; teeth and gums normal  Neck:   Supple, symmetrical, trachea midline, no adenopathy; thyroid: no enlargement/tenderness/nodules; no carotid bruit or JVD  Back:     Symmetric, no curvature, ROM normal, no CVA tenderness  Lungs:     Clear to auscultation bilaterally, respirations unlabored  Chest Wall:    No tenderness or deformity   Heart:    Regular rate and rhythm, S1 and S2 normal, no murmur, rub or gallop  Breast Exam:    No tenderness, masses, or nipple abnormality  Abdomen:     Soft, non-tender, bowel sounds active all four quadrants, no masses, no organomegaly.  FHT 160  bpm (obtained by sono).  Genitalia:    Pelvic:external genitalia normal, vagina without lesions, discharge, or tenderness, rectovaginal septum  normal. Cervix normal in appearance, no cervical motion tenderness, no adnexal masses or tenderness.  Pregnancy positive findings: uterine enlargement: unable to determine due to body habitus.    Rectal:    Normal external sphincter.  No hemorrhoids appreciated. Internal exam not done.   Extremities:   Extremities normal, atraumatic, no  cyanosis or edema  Pulses:   2+ and symmetric all extremities  Skin:   Skin color, texture, turgor normal, no rashes or lesions  Lymph nodes:   Cervical, supraclavicular, and axillary nodes normal  Neurologic:   CNII-XII intact, normal strength, sensation and reflexes throughout      Assessment:    Pregnancy at 10 and 6/7 weeks   Morbid obesity (Class III) DM Type II Hypothyroidism cHTN (on no meds) H/o depression, currently on no meds, asymptomatic. Gastroenteritis   Plan:   - Initial labs reviewed. - Prenatal vitamins encouraged. - Problem list reviewed and updated. - New OB counseling:  The patient has been given an overview regarding routine prenatal care.  Recommendations regarding diet, weight gain, and exercise in pregnancy were given.  Patient should aim to gain 10-15 lbs during pregnancy.  - Prenatal testing, optional genetic testing, and ultrasound use  in pregnancy were reviewed.  AFP3 discussed: undecided.  To reassess again next visit.  - Benefits of Breast Feeding were discussed. The patient is encouraged to consider nursing her baby post partum. - DM II in pregnancy - started on NPH and Regular insulin regimen ~ 2-3 weeks ago.  Patient had previously not taken any meds for the past 3-4 months (discontinued previous meds due to side effects and never followed up with PCP). Blood sugars currently as follows for the past week: fastings (87-155, most values elevated, but down from 200s on initial week of insulin); post-breakfast (90-181, down from 215-315 on initial week of insulin); post-lunch (126-174, down from 140s-270s on initial week of insulin); post-dinner (110-222, down from 160s-270s on initial week of insulin).  Patient has been to [facility], still working on dietary adjustments. Will also increase dosing of insulin by 2 units for both NPH and regular in a.m. and p.m.  Discussed need for antenatal testing and growth scans beginning in 3rd trimester.  - cHTN, on no  meds. BPs ok currently. Baseline creatinine wnl.  Will continue to monitor during the pregnancy.  - Will refer to [facility] for initial consultation and for scheduling of high risk scans.   - Will need referral to Anesthesiology due to BMI (currently 48).  - Hypothyroidism currenty under control.  Continue Synthroid as prescribed. Will monitor thyroid levels each trimester.   - Will continue to monitor for depression symptoms in pregnancy.  - Gastroenteritis improving without interventions. Encouraged hydration as much as possible, has medication list for safe in pregnancy meds to treat symptoms if needed.  - Follow up in 2 weeks to recheck blood sugars.  > 50% of 40 min visit spent on counseling and coordination of care.      Hildred Laser, MD [facility]

## 2016-12-16 ENCOUNTER — Encounter: Payer: Self-pay | Admitting: Obstetrics and Gynecology

## 2016-12-17 LAB — PAP IG, CT-NG, RFX HPV ASCU
Chlamydia, Nuc. Acid Amp: NEGATIVE
GONOCOCCUS BY NUCLEIC ACID AMP: NEGATIVE
PAP Smear Comment: 0

## 2016-12-23 ENCOUNTER — Inpatient Hospital Stay: Admission: RE | Admit: 2016-12-23 | Payer: BLUE CROSS/BLUE SHIELD | Source: Ambulatory Visit

## 2016-12-24 ENCOUNTER — Ambulatory Visit (INDEPENDENT_AMBULATORY_CARE_PROVIDER_SITE_OTHER): Payer: BLUE CROSS/BLUE SHIELD | Admitting: Family Medicine

## 2016-12-24 ENCOUNTER — Encounter: Payer: Self-pay | Admitting: Family Medicine

## 2016-12-24 VITALS — BP 121/81 | HR 92 | Temp 97.9°F | Ht 64.0 in | Wt 285.5 lb

## 2016-12-24 DIAGNOSIS — E1122 Type 2 diabetes mellitus with diabetic chronic kidney disease: Secondary | ICD-10-CM

## 2016-12-24 DIAGNOSIS — F332 Major depressive disorder, recurrent severe without psychotic features: Secondary | ICD-10-CM

## 2016-12-24 DIAGNOSIS — E039 Hypothyroidism, unspecified: Secondary | ICD-10-CM

## 2016-12-24 DIAGNOSIS — O99281 Endocrine, nutritional and metabolic diseases complicating pregnancy, first trimester: Secondary | ICD-10-CM

## 2016-12-24 MED ORDER — SERTRALINE HCL 50 MG PO TABS: ORAL_TABLET | ORAL | 1 refills | Status: DC

## 2016-12-24 NOTE — Assessment & Plan Note (Signed)
Last A1c 7.7- now on insulin. In nutrition counseling and being monitored by GYN. Continue to monitor.

## 2016-12-24 NOTE — Patient Instructions (Addendum)

## 2016-12-24 NOTE — Assessment & Plan Note (Signed)
Significantly more depressed in the last month. Stable on her dose. Rechecking today. Will forward results to GYN.

## 2016-12-24 NOTE — Progress Notes (Signed)
BP 121/81 (BP Location: Left Arm, Patient Position: Sitting, Cuff Size: Large)   Pulse 92   Temp 97.9 F (36.6 C)   Ht 5\' 4"  (1.626 m)   Wt 285 lb 8 oz (129.5 kg)   LMP 09/13/2016 (Within Days)   SpO2 100%   BMI 49.01 kg/m    Subjective:    Patient ID: Maria Reyes, female    DOB: 03-Nov-1990, 26 y.o.   MRN: [id number]  HPI: Maria PilgrimBrooke Noblet is a 26 y.o. female  Chief Complaint  Patient presents with  . Follow-up  . Diabetes  . Gestational Diabetes  . Thyroid Problem   Currently 3863w1d pregnant, following with Dr. Valentino Saxonherry with close follow up given her sugars  DIABETES- [facility], up from 6.9 previously. Started on insulin by GYN on 11/19/16, [facility] Hypoglycemic episodes:yes- dropping to 60s/50s in the AM, feeling bad when that happens- usually 2 hours after she eats, she has been taking glucose tablets when that happens. Has been having yogurt in the morning. Has been having a stick of cheese, and oatmeal Polydipsia/polyuria: no Visual disturbance: no Chest pain: no Paresthesias: no Glucose Monitoring: yes  Accucheck frequency: 4x a day  Fasting glucose: 74-90s  Post prandial: 110-150 is the highest  Evening: 110-150 is the highest  Before meals: no checking Taking Insulin?: yes  Long acting insulin:  Short acting insulin: Blood Pressure Monitoring: not checking Retinal Examination: Not up to Date Foot Exam: Up to Date Diabetic Education: Completed Pneumovax: Up to Date Influenza: Up to Date Aspirin: no  HYPOTHYROIDISM- Will need to be checked each trimester by GYN, last checked 6 weeks ago- will recheck today. Stable last visit.  Thyroid control status:controlled Satisfied with current treatment? yes Medication side effects: no Medication compliance: excellent compliance Recent dose adjustment:yes Fatigue: yes Cold intolerance: no Heat intolerance: no Weight gain: no Weight  loss: no Constipation: no Diarrhea/loose stools: no Palpitations: no Lower extremity edema: no Anxiety/depressed mood: yes  DEPRESSION Mood status: exacerbated Satisfied with current treatment?: no Symptom severity: moderate  Duration of current treatment : Not on anything Side effects: no Medication compliance: Not on anything Psychotherapy/counseling: yes current Previous psychiatric medications: unknown Depressed mood: yes Anxious mood: yes Anhedonia: no Significant weight loss or gain: yes Insomnia: yes hard to fall asleep and stay asleep Fatigue: yes Feelings of worthlessness or guilt: yes Impaired concentration/indecisiveness: yes Suicidal ideations: yes- fleeting and non-specific Hopelessness: yes Crying spells: yes [facility] screen [facility] 2/[facility] 12/24/2016 12/09/2016 10/02/2015  Decreased Interest 3 0 3  Down, Depressed, Hopeless 3 0 1  PHQ - 2 Score 6 0 4  Altered sleeping 3 - 3  Tired, decreased energy 3 - 3  Change in appetite 3 - 3  Feeling bad or failure about yourself  3 - 3  Trouble concentrating 3 - 2  Moving slowly or fidgety/restless 2 - 3  Suicidal thoughts 2 - 0  PHQ-9 Score 25 - 21  Difficult doing work/chores - - Somewhat difficult     Relevant past medical, surgical, family and social history reviewed and updated as indicated. Interim medical history since our last visit reviewed. Allergies and medications reviewed and updated.  Review of Systems  Constitutional: Positive for fatigue. Negative for activity change, appetite change, chills, diaphoresis, fever and unexpected weight change.  Respiratory: Negative.   Cardiovascular: Negative.   Psychiatric/Behavioral: Positive for decreased concentration, dysphoric mood, sleep disturbance and suicidal ideas.  Negative for agitation, behavioral problems, confusion, hallucinations and self-injury. The patient is nervous/anxious. The patient is not hyperactive.     Per HPI unless specifically indicated  above     Objective:    BP 121/81 (BP Location: Left Arm, Patient Position: Sitting, Cuff Size: Large)   Pulse 92   Temp 97.9 F (36.6 C)   Ht 5\' 4"  (1.626 m)   Wt 285 lb 8 oz (129.5 kg)   LMP 09/13/2016 (Within Days)   SpO2 100%   BMI 49.01 kg/m   Wt Readings from Last 3 Encounters:  12/24/16 285 lb 8 oz (129.5 kg)  12/15/16 279 lb 11.2 oz (126.9 kg)  12/15/16 279 lb 14.4 oz (127 kg)    Physical Exam  Constitutional: She is oriented to person, place, and time. She appears well-developed and well-nourished. No distress.  HENT:  Head: Normocephalic and atraumatic.  Right Ear: Hearing normal.  Left Ear: Hearing normal.  Nose: Nose normal.  Eyes: Conjunctivae and lids are normal. Right eye exhibits no discharge. Left eye exhibits no discharge. No scleral icterus.  Cardiovascular: Normal rate, regular rhythm, normal heart sounds and intact distal pulses.  Exam reveals no gallop and no friction rub.   No murmur heard. Pulmonary/Chest: Effort normal and breath sounds normal. No respiratory distress. She has no wheezes. She has no rales. She exhibits no tenderness.  Musculoskeletal: Normal range of motion.  Neurological: She is alert and oriented to person, place, and time.  Skin: Skin is warm, dry and intact. No rash noted. She is not diaphoretic. No erythema. No pallor.  Psychiatric: She has a normal mood and affect. Her speech is normal and behavior is normal. Judgment and thought content normal. Cognition and memory are normal.  Nursing note and vitals reviewed.   Results for orders placed or performed in visit on 12/15/16  POCT urinalysis dipstick  Result Value Ref Range   Color, UA yellow    Clarity, UA clear    Glucose, UA neg    Bilirubin, UA neg    Ketones, UA neg    Spec Grav, UA >=1.030 (A) 1.010 - 1.025   Blood, UA neg    pH, UA 5.0 5.0 - 8.0   Protein, UA TRACE    Urobilinogen, UA 0.2 0.2 or 1.0 E.U./dL   Nitrite, UA neg    Leukocytes, UA Negative Negative   Pap IG, CT/NG w/ reflex HPV when ASC-U  Result Value Ref Range   DIAGNOSIS: Comment    Specimen adequacy: Comment    CLINICIAN PROVIDED ICD10: Comment    Performed by: Comment    PAP Smear Comment .    Note: Comment    Test Methodology Comment    PAP Reflex Comment    Chlamydia, Nuc. Acid Amp Negative Negative   Gonococcus by Nucleic Acid Amp Negative Negative      Assessment & Plan:   Problem List Items Addressed This Visit      Endocrine   DM (diabetes mellitus), type 2 with renal complications (HCC)    Last A1c 7.7- now on insulin. In nutrition counseling and being monitored by GYN. Continue to monitor.       Hypothyroidism affecting pregnancy in first trimester - Primary    Significantly more depressed in the last month. Stable on her dose. Rechecking today. Will forward results to GYN.       Relevant Orders   Thyroid Panel With TSH     Other   Depression  Acting up again. Will check thyroid. Will start low dose zoloft- recheck 1 month.       Relevant Medications   sertraline (ZOLOFT) 50 MG tablet       Follow up plan: Return in about 4 weeks (around 01/21/2017) for Follow up mood.

## 2016-12-24 NOTE — Assessment & Plan Note (Signed)
Acting up again. Will check thyroid. Will start low dose zoloft- recheck 1 month.

## 2016-12-25 LAB — THYROID PANEL WITH TSH
FREE THYROXINE INDEX: 2.5 (ref 1.2–4.9)
T3 Uptake Ratio: 19 % — ABNORMAL LOW (ref 24–39)
T4, Total: 13.3 ug/dL — ABNORMAL HIGH (ref 4.5–12.0)
TSH: 3.93 u[IU]/mL (ref 0.450–4.500)

## 2016-12-28 ENCOUNTER — Encounter
Admission: RE | Admit: 2016-12-28 | Discharge: 2016-12-28 | Disposition: A | Discharge status: Home / Self Care | Payer: BLUE CROSS/BLUE SHIELD | Source: Ambulatory Visit | Attending: Anesthesiology | Admitting: Anesthesiology

## 2016-12-28 ENCOUNTER — Encounter: Payer: Self-pay | Admitting: Obstetrics and Gynecology

## 2016-12-28 ENCOUNTER — Encounter: Payer: BLUE CROSS/BLUE SHIELD | Admitting: Obstetrics and Gynecology

## 2016-12-28 DIAGNOSIS — O0992 Supervision of high risk pregnancy, unspecified, second trimester: Secondary | ICD-10-CM

## 2016-12-28 DIAGNOSIS — O10912 Unspecified pre-existing hypertension complicating pregnancy, second trimester: Secondary | ICD-10-CM

## 2016-12-28 DIAGNOSIS — E039 Hypothyroidism, unspecified: Secondary | ICD-10-CM

## 2016-12-28 DIAGNOSIS — O24112 Pre-existing diabetes mellitus, type 2, in pregnancy, second trimester: Secondary | ICD-10-CM

## 2016-12-28 NOTE — Pre-Procedure Instructions (Signed)
CONSULT BY DR Arlana PouchKEPHARDT, ANESTH.PATIENT SH[facility] DELIVER  [facility]

## 2016-12-29 ENCOUNTER — Encounter: Payer: BLUE CROSS/BLUE SHIELD | Admitting: Obstetrics and Gynecology

## 2017-01-03 ENCOUNTER — Ambulatory Visit: Payer: BLUE CROSS/BLUE SHIELD | Attending: Family Medicine

## 2017-01-04 ENCOUNTER — Encounter: Payer: Self-pay | Admitting: Obstetrics and Gynecology

## 2017-01-11 ENCOUNTER — Encounter: Payer: Self-pay | Admitting: Family Medicine

## 2017-01-17 ENCOUNTER — Institutional Professional Consult (permissible substitution): Payer: BLUE CROSS/BLUE SHIELD

## 2017-01-18 ENCOUNTER — Ambulatory Visit (INDEPENDENT_AMBULATORY_CARE_PROVIDER_SITE_OTHER): Payer: BLUE CROSS/BLUE SHIELD | Admitting: Obstetrics and Gynecology

## 2017-01-18 VITALS — BP 133/71 | HR 94 | Wt 288.3 lb

## 2017-01-18 DIAGNOSIS — I10 Essential (primary) hypertension: Secondary | ICD-10-CM

## 2017-01-18 DIAGNOSIS — O0992 Supervision of high risk pregnancy, unspecified, second trimester: Secondary | ICD-10-CM | POA: Diagnosis not present

## 2017-01-18 DIAGNOSIS — O99282 Endocrine, nutritional and metabolic diseases complicating pregnancy, second trimester: Secondary | ICD-10-CM

## 2017-01-18 DIAGNOSIS — F332 Major depressive disorder, recurrent severe without psychotic features: Secondary | ICD-10-CM

## 2017-01-18 DIAGNOSIS — O24112 Pre-existing diabetes mellitus, type 2, in pregnancy, second trimester: Secondary | ICD-10-CM

## 2017-01-18 DIAGNOSIS — E039 Hypothyroidism, unspecified: Secondary | ICD-10-CM

## 2017-01-18 LAB — POCT URINALYSIS DIPSTICK
BILIRUBIN UA: NEGATIVE
Glucose, UA: 500
KETONES UA: NEGATIVE
Leukocytes, UA: NEGATIVE
NITRITE UA: NEGATIVE
PH UA: 6 (ref 5.0–8.0)
Spec Grav, UA: 1.025 (ref 1.010–1.025)
Urobilinogen, UA: 0.2 E.U./dL

## 2017-01-18 NOTE — Progress Notes (Signed)
ROB: Patient c/o constipation, trying Metamucil, Colace without relief.  Recommend milk of magnesia, prune/apple juice, and suppositories as needed. Reports blood sugars 80-90s fasting, mostly 115-130s, with occasional elevations to 160s if she eats something "bad".  Glucosuria +1 today.  Recommend that patient f/u with log book in 1 week. Still undecided on genetic screening, given new handouts.  [facility] date (due to BMI > 50 at initial visit).  Was seen by PCP last month, had complaints of depression and hypoglycemic episodes on new insulin regimen.  Notes her blood sugars have improved since adding few small snacks.  Was started on Zoloft 50 mg, patient notes she is feeling better. RTC in 2 weeks, [facility]

## 2017-01-20 ENCOUNTER — Telehealth: Payer: Self-pay | Admitting: Obstetrics and Gynecology

## 2017-01-20 NOTE — Progress Notes (Deleted)
   LMP 09/13/2016 (Within Days)    Subjective:    Patient ID: Ananias PilgrimBrooke Iverson, female    DOB: [birth year], 26 y.o.   MRN: [id number]  HPI: Ananias PilgrimBrooke Stipp is a 26 y.o. female  No chief complaint on file.  DEPRESSION Mood status: {Blank single:19197::"controlled","uncontrolled","better","worse","exacerbated","stable"} Satisfied with current treatment?: {Blank single:19197::"yes","no"} Symptom severity: {Blank single:19197::"mild","moderate","severe"}  Duration of current treatment : {Blank single:19197::"chronic","months","years"} Side effects: {Blank single:19197::"yes","no"} Medication compliance: {Blank single:19197::"excellent compliance","good compliance","fair compliance","poor compliance"} Psychotherapy/counseling: {Blank single:19197::"yes","no"} {Blank single:19197::"current","in the past"} Previous psychiatric medications: {Blank multiple:19196::"abilify","amitryptiline","buspar","celexa","cymbalta","depakote","effexor","lamictal","lexapro","lithium","nortryptiline","paxil","prozac","pristiq (desvenlafaxine","seroquel","wellbutrin","zoloft","zyprexa"} Depressed mood: {Blank single:19197::"yes","no"} Anxious mood: {Blank single:19197::"yes","no"} Anhedonia: {Blank single:19197::"yes","no"} Significant weight loss or gain: {Blank single:19197::"yes","no"} Insomnia: {Blank single:19197::"yes","no"} {Blank single:19197::"hard to fall asleep","hard to stay asleep"} Fatigue: {Blank single:19197::"yes","no"} Feelings of worthlessness or guilt: {Blank single:19197::"yes","no"} Impaired concentration/indecisiveness: {Blank single:19197::"yes","no"} Suicidal ideations: {Blank single:19197::"yes","no"} Hopelessness: {Blank single:19197::"yes","no"} Crying spells: {Blank single:19197::"yes","no"} Depression screen [facility] 2/9 12/24/2016 12/09/2016 10/02/2015  Decreased Interest 3 0 3  Down, Depressed, Hopeless 3 0 1  PHQ - 2 Score 6 0 4  Altered sleeping 3 - 3  Tired, decreased energy 3 - 3    Change in appetite 3 - 3  Feeling bad or failure about yourself  3 - 3  Trouble concentrating 3 - 2  Moving slowly or fidgety/restless 2 - 3  Suicidal thoughts 2 - 0  PHQ-9 Score 25 - 21  Difficult doing work/chores - - Somewhat difficult    Relevant past medical, surgical, family and social history reviewed and updated as indicated. Interim medical history since our last visit reviewed. Allergies and medications reviewed and updated.  Review of Systems  Per HPI unless specifically indicated above     Objective:    LMP 09/13/2016 (Within Days)   Wt Readings from Last 3 Encounters:  01/18/17 288 lb 4.8 oz (130.8 kg)  12/24/16 285 lb 8 oz (129.5 kg)  12/15/16 279 lb 11.2 oz (126.9 kg)    Physical Exam  Results for orders placed or performed in visit on 01/18/17  POCT urinalysis dipstick  Result Value Ref Range   Color, UA dark yellow    Clarity, UA clear    Glucose, UA 500    Bilirubin, UA neg    Ketones, UA neg    Spec Grav, UA 1.025 1.010 - 1.025   Blood, UA NHT    pH, UA 6.0 5.0 - 8.0   Protein, UA 30+    Urobilinogen, UA 0.2 0.2 or 1.0 E.U./dL   Nitrite, UA neg    Leukocytes, UA Negative Negative      Assessment & Plan:   Problem List Items Addressed This Visit      Other   Depression - Primary       Follow up plan: No Follow-up on file.

## 2017-01-20 NOTE — Telephone Encounter (Signed)
Message from Dr Valentino Saxonherry to schedule patient for follow up appt in 2 weeks from 01/18/2017 [facility]

## 2017-01-21 ENCOUNTER — Ambulatory Visit: Payer: BLUE CROSS/BLUE SHIELD | Admitting: Family Medicine

## 2017-01-24 ENCOUNTER — Ambulatory Visit: Payer: BLUE CROSS/BLUE SHIELD | Admitting: Family Medicine

## 2017-01-28 ENCOUNTER — Ambulatory Visit: Payer: BLUE CROSS/BLUE SHIELD | Admitting: Family Medicine

## 2017-02-09 ENCOUNTER — Encounter (HOSPITAL_COMMUNITY): Payer: Self-pay | Admitting: *Deleted

## 2017-02-09 ENCOUNTER — Emergency Department (HOSPITAL_COMMUNITY)
Admission: EM | Admit: 2017-02-09 | Discharge: 2017-02-09 | Disposition: A | Discharge status: Home / Self Care | Payer: BLUE CROSS/BLUE SHIELD | Attending: Emergency Medicine | Admitting: Emergency Medicine

## 2017-02-09 DIAGNOSIS — O99282 Endocrine, nutritional and metabolic diseases complicating pregnancy, second trimester: Secondary | ICD-10-CM | POA: Diagnosis not present

## 2017-02-09 DIAGNOSIS — Z3A19 19 weeks gestation of pregnancy: Secondary | ICD-10-CM | POA: Insufficient documentation

## 2017-02-09 DIAGNOSIS — N898 Other specified noninflammatory disorders of vagina: Secondary | ICD-10-CM

## 2017-02-09 DIAGNOSIS — I10 Essential (primary) hypertension: Secondary | ICD-10-CM | POA: Insufficient documentation

## 2017-02-09 DIAGNOSIS — E119 Type 2 diabetes mellitus without complications: Secondary | ICD-10-CM | POA: Diagnosis not present

## 2017-02-09 DIAGNOSIS — R1030 Lower abdominal pain, unspecified: Secondary | ICD-10-CM | POA: Diagnosis present

## 2017-02-09 DIAGNOSIS — O26852 Spotting complicating pregnancy, second trimester: Secondary | ICD-10-CM | POA: Insufficient documentation

## 2017-02-09 DIAGNOSIS — Z794 Long term (current) use of insulin: Secondary | ICD-10-CM | POA: Insufficient documentation

## 2017-02-09 LAB — URINALYSIS, ROUTINE W REFLEX MICROSCOPIC
BILIRUBIN URINE: NEGATIVE
HGB URINE DIPSTICK: NEGATIVE
Ketones, ur: NEGATIVE mg/dL
LEUKOCYTES UA: NEGATIVE
NITRITE: NEGATIVE
PH: 5 (ref 5.0–8.0)
Protein, ur: 30 mg/dL — AB
SPECIFIC GRAVITY, URINE: 1.02 (ref 1.005–1.030)

## 2017-02-09 LAB — CBG MONITORING, ED: Glucose-Capillary: 152 mg/dL — ABNORMAL HIGH (ref 65–99)

## 2017-02-09 NOTE — ED Triage Notes (Addendum)
Pt started having lower abdominal cramping that started about 1.5 hours ago. Pt reports she went to the bathroom and she had "something" fall out of her vagina. Pt reports it was [name] in color and was "veiny". Pt also reports nausea, diarrhea and dizziness that started this morning. [facility] to notify OB and pt was told to come to ED for evaluation. Pt is [redacted] weeks pregnant. Pt reports some pinkish colored vaginal discharge. Last OB appt was yesterday and pt reports she is high risk due to her type II diabetes.

## 2017-02-09 NOTE — Discharge Instructions (Signed)
Follow up with your OB doctor, call to be checked later this week.  Return for worsening symptoms

## 2017-02-09 NOTE — ED Provider Notes (Signed)
[facility]-EM[facility]Y DEPT Provider Note   CSN: [id number] Arrival date & time: 02/09/17  1313     History   Chief Complaint Chief Complaint  Patient presents with  . Abdominal Pain    [redacted] weeks pregnant    HPI Maria Reyes is a 26 y.o. female.  HPI Patient presents to the emergency room for evaluation of lower abdominal cramping. Patient states she started having some cramping about an hour and a half ago. She had some loose diarrhea stools. She went to the bathroom because she felt like she needed to urinate. She had a sensation that something was falling from her vagina. In the commode she noticed white mucousy substance.  Pt called her insurance advice line and they advised coming to the ED to be evaluated.  Past Medical History:  Diagnosis Date  . Diabetes mellitus without complication (HCC)    Type Two  . Hypertension   . Hypothyroid   . Ovarian cyst     Patient Active Problem List   Diagnosis Date Noted  . Depression 10/02/2015  . Hypothyroidism in pregnancy 08/20/2015  . Morbid obesity (HCC) 08/19/2015  . Abnormal menstrual cycle 08/19/2015  . DM (diabetes mellitus), type 2 with renal complications (HCC)   . Hypertension   . Ovarian cyst     Past Surgical History:  Procedure Laterality Date  . LAPAROSCOPIC OVARIAN CYSTECTOMY      OB History    Gravida Para Term Preterm AB Living   1       0     SAB TAB Ectopic Multiple Live Births     0             Home Medications    Prior to Admission medications   Medication Sig Start Date End Date Taking? Authorizing Provider  insulin NPH Human (NOVOLIN N) 100 UNIT/ML injection Inject 0.16 mLs (16 Units total) into the skin daily before supper. 11/23/16  Yes Hildred Laser, MD  insulin NPH Human (NOVOLIN N) 100 UNIT/ML injection Inject 0.45 mLs (45 Units total) into the skin daily with breakfast. 11/23/16  Yes Hildred Laser, MD  insulin regular (NOVOLIN R) 100 units/mL injection Inject 0.16 mLs (16 Units total) into the  skin daily before supper. 11/23/16  Yes Hildred Laser, MD  insulin regular (NOVOLIN R) 100 units/mL injection Inject 0.22 mLs (22 Units total) into the skin daily before breakfast. 11/23/16  Yes Hildred Laser, MD  Insulin Syringe-Needle U-100 31G X 5/16" 1 ML MISC 1 Syringe by Does not apply route 4 (four) times daily. 11/19/16  Yes Hildred Laser, MD  levothyroxine (SYNTHROID, LEVOTHROID) 25 MCG tablet TAKE 1 TABLET(50 MCG) BY MOUTH DAILY BEFORE BREAKFAST Patient taking differently: Take 50 mcg by mouth daily before breakfast. TAKE 1 TABLET(50 MCG) BY MOUTH DAILY BEFORE BREAKFAST 11/10/16  Yes Johnson, Megan P, DO  PRENATAL 27-1 MG TABS Take 1 tablet by mouth daily. 11/09/16  Yes Johnson, Megan P, DO  sertraline (ZOLOFT) 50 MG tablet 1/2 tab for 1 week, then increase to 1 tab daily Patient taking differently: Take 50 mg by mouth daily. 1/2 tab for 1 week, then increase to 1 tab daily 12/24/16  Yes Dorcas Carrow, DO    Family History Family History  Problem Relation Age of Onset  . Endometriosis Mother   . Ovarian cysts Mother   . Diabetes Father   . Heart disease Father   . Diabetes Maternal Grandmother   . Hypertension Maternal Grandmother   . Cancer Maternal Grandmother  Breast  . Diabetes Maternal Grandfather   . Diabetes Paternal Grandmother   . Diabetes Paternal Grandfather     Social History Social History  Substance Use Topics  . Smoking status: Never Smoker  . Smokeless tobacco: Never Used  . Alcohol use No     Comment: Occasionally - not during pregnancy     Allergies   Patient has no known allergies.   Review of Systems Review of Systems  All other systems reviewed and are negative.    Physical Exam Updated Vital Signs BP (!) 150/79 (BP Location: Left Arm)   Pulse (!) 114   Temp 98 F (36.7 C) (Oral)   Resp 18   Ht 1.651 m (5\' 5" )   Wt 131.5 kg (290 lb)   LMP 09/13/2016 (Within Days)   SpO2 97%   BMI 48.26 kg/m   Physical Exam  Constitutional:  She appears well-developed and well-nourished. No distress.  HENT:  Head: Normocephalic and atraumatic.  Right Ear: External ear normal.  Left Ear: External ear normal.  Eyes: Conjunctivae are normal. Right eye exhibits no discharge. Left eye exhibits no discharge. No scleral icterus.  Neck: Neck supple. No tracheal deviation present.  Cardiovascular: Normal rate.   Pulmonary/Chest: Effort normal. No stridor. No respiratory distress.  Abdominal: Soft. She exhibits no distension. There is no tenderness.  Obese, large pannus  Genitourinary: Vagina normal. Pelvic exam was performed with patient supine. There is no rash, tenderness or lesion on the right labia. There is no rash, tenderness or lesion on the left labia. Uterus is enlarged (gravid). Uterus is not tender. Cervix exhibits no motion tenderness and no discharge. Right adnexum displays no mass, no tenderness and no fullness. Left adnexum displays no mass, no tenderness and no fullness.  Musculoskeletal: She exhibits no edema.  Neurological: She is alert. Cranial nerve deficit: no gross deficits.  Skin: Skin is warm and dry. No rash noted.  Psychiatric: She has a normal mood and affect.  Nursing note and vitals reviewed.    ED Treatments / Results  Labs (all labs ordered are listed, but only abnormal results are displayed) Labs Reviewed  URINALYSIS, ROUTINE W REFLEX MICROSCOPIC - Abnormal; Notable for the following:       Result Value   APPearance HAZY (*)    Glucose, UA >=500 (*)    Protein, ur 30 (*)    Bacteria, UA RARE (*)    Squamous Epithelial / LPF 0-5 (*)    All other components within normal limits  CBG MONITORING, ED     Procedures Procedures (including critical care time) EMERGENCY DEPARTMENT US PREGNANCY "Study: Limited Ultrasound of the Pelvis for Pregnancy"  INDICATIONS:Pregnancy(required) and discharge Multiple views of the uterus and pelvic cavity were obtained in real-time with a multi-frequency  probe.  APPROACH:Transabdominal  PERFORMED BY: Myself IMAGES ARCHIVED?: Yes LIMITATIONS: Body habitus PREGNANCY FREE FLUID: None ADNEXAL FINDINGS:Left ovary not seen and Right ovary not seen GESTATIONAL AGE, ESTIMATE: 19 week, not measured FETAL HEART RATE: not measured during exam, nurse documented perviously 140s INTERPRETATION: Fetal heart activity seen and active fetal movement     Medications Ordered in ED Medications - No data to display   Initial Impression / Assessment and Plan / ED Course  I have reviewed the triage vital signs and the nursing notes.  Pertinent labs & imaging results that were available during my care of the patient were reviewed by me and considered in my medical decision making (see chart for details).  Pelvic exam is reassuring.  IUP confirmed.  Active fetal movements on bedside ultrasound. No sign of discharge, fluid leakage.  UA without signs of  Infection.  Pt reassured.   Will have her follow up with her OB doctor later this week to be rechecked.  Precautions discussed  Final Clinical Impressions(s) / ED Diagnoses   Final diagnoses:  Vaginal discharge    New Prescriptions New Prescriptions   No medications on file     Linwood DibblesKnapp, Syrena Burges, MD 02/09/17 1530

## 2017-02-10 DIAGNOSIS — E1129 Type 2 diabetes mellitus with other diabetic kidney complication: Secondary | ICD-10-CM | POA: Insufficient documentation

## 2017-02-10 DIAGNOSIS — O10919 Unspecified pre-existing hypertension complicating pregnancy, unspecified trimester: Secondary | ICD-10-CM | POA: Insufficient documentation

## 2017-02-10 DIAGNOSIS — E1165 Type 2 diabetes mellitus with hyperglycemia: Secondary | ICD-10-CM

## 2017-03-07 ENCOUNTER — Other Ambulatory Visit: Payer: Self-pay | Admitting: Family Medicine

## 2017-03-16 ENCOUNTER — Other Ambulatory Visit: Payer: Self-pay | Admitting: Family Medicine

## 2017-03-16 NOTE — Telephone Encounter (Signed)
Patient notified

## 2017-03-16 NOTE — Telephone Encounter (Signed)
Called patient, left message for patient to return my call.

## 2017-03-16 NOTE — Telephone Encounter (Signed)
She should be having this managed by OB

## 2017-04-19 ENCOUNTER — Other Ambulatory Visit: Payer: Self-pay | Admitting: Family Medicine

## 2017-05-31 MED ORDER — IRON PO
1.00 | ORAL | Status: DC
Start: 2017-06-01 — End: 2017-05-31

## 2017-05-31 MED ORDER — DOCUSATE SODIUM 100 MG PO CAPS
100.00 mg | ORAL_CAPSULE | ORAL | Status: DC
Start: ? — End: 2017-05-31

## 2017-05-31 MED ORDER — MAGNESIUM HYDROXIDE 400 MG/5ML PO SUSP
30.00 | ORAL | Status: DC
Start: ? — End: 2017-05-31

## 2017-05-31 MED ORDER — IBUPROFEN 600 MG PO TABS
600.00 mg | ORAL_TABLET | ORAL | Status: DC
Start: 2017-05-31 — End: 2017-05-31

## 2017-05-31 MED ORDER — SIMETHICONE 80 MG PO CHEW
80.00 mg | CHEWABLE_TABLET | ORAL | Status: DC
Start: ? — End: 2017-05-31

## 2017-05-31 MED ORDER — NALOXONE HCL 4 MG/10ML IJ SOLN
0.40 mg | INTRAMUSCULAR | Status: DC
Start: ? — End: 2017-05-31

## 2017-05-31 MED ORDER — GENERIC EXTERNAL MEDICATION
15.00 | Status: DC
Start: ? — End: 2017-05-31

## 2017-05-31 MED ORDER — NALBUPHINE HCL 10 MG/ML IJ SOLN
2.50 mg | INTRAMUSCULAR | Status: DC
Start: ? — End: 2017-05-31

## 2017-05-31 MED ORDER — LIDOCAINE 5 % EX PTCH
1.00 | MEDICATED_PATCH | CUTANEOUS | Status: DC
Start: 2017-06-01 — End: 2017-05-31

## 2017-05-31 MED ORDER — DEXTROSE IN LACTATED RINGERS 5 % IV SOLN
125.00 | INTRAVENOUS | Status: DC
Start: ? — End: 2017-05-31

## 2017-05-31 MED ORDER — ONDANSETRON 4 MG PO TBDP
4.00 mg | ORAL_TABLET | ORAL | Status: DC
Start: ? — End: 2017-05-31

## 2017-05-31 MED ORDER — LEVOTHYROXINE SODIUM 50 MCG PO TABS
50.00 | ORAL_TABLET | ORAL | Status: DC
Start: 2017-06-01 — End: 2017-05-31

## 2017-05-31 MED ORDER — DEXTROSE 50 % IV SOLN
12.50 | INTRAVENOUS | Status: DC
Start: ? — End: 2017-05-31

## 2017-05-31 MED ORDER — SERTRALINE HCL 50 MG PO TABS
50.00 mg | ORAL_TABLET | ORAL | Status: DC
Start: 2017-06-01 — End: 2017-05-31

## 2017-05-31 MED ORDER — GENERIC EXTERNAL MEDICATION
Status: DC
Start: ? — End: 2017-05-31

## 2017-05-31 MED ORDER — BISACODYL 10 MG RE SUPP
10.00 mg | RECTAL | Status: DC
Start: ? — End: 2017-05-31

## 2017-05-31 MED ORDER — ACETAMINOPHEN 325 MG PO TABS
650.00 mg | ORAL_TABLET | ORAL | Status: DC
Start: 2017-05-31 — End: 2017-05-31

## 2017-05-31 MED ORDER — BISACODYL 5 MG PO TBEC
5.00 mg | DELAYED_RELEASE_TABLET | ORAL | Status: DC
Start: ? — End: 2017-05-31

## 2017-05-31 MED ORDER — DIPHENHYDRAMINE HCL 25 MG PO CAPS
25.00 mg | ORAL_CAPSULE | ORAL | Status: DC
Start: ? — End: 2017-05-31

## 2017-05-31 MED ORDER — INSULIN REGULAR HUMAN 100 UNIT/ML IJ SOLN
0.00 | INTRAMUSCULAR | Status: DC
Start: 2017-05-31 — End: 2017-05-31

## 2017-07-26 ENCOUNTER — Ambulatory Visit: Payer: BLUE CROSS/BLUE SHIELD | Admitting: Family Medicine

## 2017-07-26 ENCOUNTER — Encounter: Payer: Self-pay | Admitting: Family Medicine

## 2017-07-26 VITALS — BP 137/84 | HR 60 | Temp 97.6°F | Wt 276.5 lb

## 2017-07-26 DIAGNOSIS — M67432 Ganglion, left wrist: Secondary | ICD-10-CM

## 2017-07-26 DIAGNOSIS — E039 Hypothyroidism, unspecified: Secondary | ICD-10-CM

## 2017-07-26 DIAGNOSIS — Z23 Encounter for immunization: Secondary | ICD-10-CM | POA: Diagnosis not present

## 2017-07-26 DIAGNOSIS — O99282 Endocrine, nutritional and metabolic diseases complicating pregnancy, second trimester: Secondary | ICD-10-CM

## 2017-07-26 DIAGNOSIS — E1122 Type 2 diabetes mellitus with diabetic chronic kidney disease: Secondary | ICD-10-CM

## 2017-07-26 DIAGNOSIS — I10 Essential (primary) hypertension: Secondary | ICD-10-CM | POA: Diagnosis not present

## 2017-07-26 DIAGNOSIS — I129 Hypertensive chronic kidney disease with stage 1 through stage 4 chronic kidney disease, or unspecified chronic kidney disease: Secondary | ICD-10-CM

## 2017-07-26 DIAGNOSIS — F332 Major depressive disorder, recurrent severe without psychotic features: Secondary | ICD-10-CM | POA: Diagnosis not present

## 2017-07-26 MED ORDER — METFORMIN HCL ER (OSM) 500 MG PO TB24: 500.0000 mg | ORAL_TABLET | Freq: Every day | ORAL | 4 refills | Status: DC

## 2017-07-26 NOTE — Patient Instructions (Addendum)
Influenza (Flu) Vaccine (Inactivated or Recombinant): What You Need to Know 1. Why get vaccinated? Influenza ("flu") is a contagious disease that spreads around the [place] every year, usually between October and May. Flu is caused by influenza viruses, and is spread mainly by coughing, sneezing, and close contact. Anyone can get flu. Flu strikes suddenly and can last several days. Symptoms vary by age, but can include:  fever/chills  sore throat  muscle aches  fatigue  cough  headache  runny or stuffy nose  Flu can also lead to pneumonia and blood infections, and cause diarrhea and seizures in children. If you have a medical condition, such as heart or lung disease, flu can make it worse. Flu is more dangerous for some people. Infants and young children, people 65 years of age and older, pregnant women, and people with certain health conditions or a weakened immune system are at greatest risk. Each year thousands of people in the [place] die from flu, and many more are hospitalized. Flu vaccine can:  keep you from getting flu,  make flu less severe if you do get it, and  keep you from spreading flu to your family and other people. 2. Inactivated and recombinant flu vaccines A dose of flu vaccine is recommended every flu season. Children 6 months through 8 years of age may need two doses during the same flu season. Everyone else needs only one dose each flu season. Some inactivated flu vaccines contain a very small amount of a mercury-based preservative called thimerosal. Studies have not shown thimerosal in vaccines to be harmful, but flu vaccines that do not contain thimerosal are available. There is no live flu virus in flu shots. They cannot cause the flu. There are many flu viruses, and they are always changing. Each year a new flu vaccine is made to protect against three or four viruses that are likely to cause disease in the upcoming flu season. But even when the  vaccine doesn't exactly match these viruses, it may still provide some protection. Flu vaccine cannot prevent:  flu that is caused by a virus not covered by the vaccine, or  illnesses that look like flu but are not.  It takes about 2 weeks for protection to develop after vaccination, and protection lasts through the flu season. 3. Some people should not get this vaccine Tell the person who is giving you the vaccine:  If you have any severe, life-threatening allergies. If you ever had a life-threatening allergic reaction after a dose of flu vaccine, or have a severe allergy to any part of this vaccine, you may be advised not to get vaccinated. Most, but not all, types of flu vaccine contain a small amount of egg protein.  If you ever had Guillain-Barr Syndrome (also called GBS). Some people with a history of GBS should not get this vaccine. This should be discussed with your doctor.  If you are not feeling well. It is usually okay to get flu vaccine when you have a mild illness, but you might be asked to come back when you feel better.  4. Risks of a vaccine reaction With any medicine, including vaccines, there is a chance of reactions. These are usually mild and go away on their own, but serious reactions are also possible. Most people who get a flu shot do not have any problems with it. Minor problems following a flu shot include:  soreness, redness, or swelling where the shot was given  hoarseness  sore,   red or itchy eyes  cough  fever  aches  headache  itching  fatigue  If these problems occur, they usually begin soon after the shot and last 1 or 2 days. More serious problems following a flu shot can include the following:  There may be a small increased risk of Guillain-Barre Syndrome (GBS) after inactivated flu vaccine. This risk has been estimated at 1 or 2 additional cases per million people vaccinated. This is much lower than the risk of severe complications from  flu, which can be prevented by flu vaccine.  Young children who get the flu shot along with pneumococcal vaccine (PCV13) and/or DTaP vaccine at the same time might be slightly more likely to have a seizure caused by fever. Ask your doctor for more information. Tell your doctor if a child who is getting flu vaccine has ever had a seizure.  Problems that could happen after any injected vaccine:  People sometimes faint after a medical procedure, including vaccination. Sitting or lying down for about 15 minutes can help prevent fainting, and injuries caused by a fall. Tell your doctor if you feel dizzy, or have vision changes or ringing in the ears.  Some people get severe pain in the shoulder and have difficulty moving the arm where a shot was given. This happens very rarely.  Any medication can cause a severe allergic reaction. Such reactions from a vaccine are very rare, estimated at about 1 in a million doses, and would happen within a few minutes to a few hours after the vaccination. As with any medicine, there is a very remote chance of a vaccine causing a serious injury or death. The safety of vaccines is always being monitored. For more information, visit: http://www.aguilar.org/ 5. What if there is a serious reaction? What should I look for? Look for anything that concerns you, such as signs of a severe allergic reaction, very high fever, or unusual behavior. Signs of a severe allergic reaction can include hives, swelling of the face and throat, difficulty breathing, a fast heartbeat, dizziness, and weakness. These would start a few minutes to a few hours after the vaccination. What should I do?  If you think it is a severe allergic reaction or other emergency that can't wait, call 9-1-1 and get the person to the nearest hospital. Otherwise, call your doctor.  Reactions should be reported to the Vaccine Adverse Event Reporting System (VAERS). Your doctor should file this report, or you  can do it yourself through the VAERS web site at www.vaers.SamedayNews.es, or by calling 6094730752. ? VAERS does not give medical advice. 6. The National Vaccine Injury Compensation Program The [employer] Vaccine Injury Compensation Program (VICP) is a federal program that was created to compensate people who may have been injured by certain vaccines. Persons who believe they may have been injured by a vaccine can learn about the program and about filing a claim by calling 458-267-6070 or visiting the [employer] website at GoldCloset.com.ee. There is a time limit to file a claim for compensation. 7. How can I learn more?  Ask your healthcare provider. He or she can give you the vaccine package insert or suggest other sources of information.  Call your local or state health department.  Contact the Centers for Disease Control and Prevention (CDC): ? Call (540)164-9661 (1-800-CDC-INFO) or ? Visit CDC's website at https://gibson.com/ Vaccine Information Statement, Inactivated Influenza Vaccine (02/15/2014) This information is not intended to replace advice given to you by your health care provider. Make sure  you discuss any questions you have with your health care provider. Document Released: 04/22/2006 Document Revised: 03/18/2016 Document Reviewed: 03/18/2016 Elsevier Interactive Patient Education  2017 [employer] Inc.  Ganglion Cyst A ganglion cyst is a noncancerous, fluid-filled lump that occurs near joints or tendons. The ganglion cyst grows out of a joint or the lining of a tendon. It most often develops in the hand or wrist, but it can also develop in the shoulder, elbow, hip, knee, ankle, or foot. The round or oval ganglion cyst can be the size of a pea or larger than a grape. Increased activity may enlarge the size of the cyst because more fluid starts to build up. What are the causes? It is not known what causes a ganglion cyst to grow. However, it may be related to:  Inflammation  or irritation around the joint.  An injury.  Repetitive movements or overuse.  Arthritis.  What increases the risk? Risk factors include:  Being a woman.  Being age 25-50.  What are the signs or symptoms? Symptoms may include:  A lump. This most often appears on the hand or wrist, but it can occur in other areas of the body.  Tingling.  Pain.  Numbness.  Muscle weakness.  Weak grip.  Less movement in a joint.  How is this diagnosed? Ganglion cysts are most often diagnosed based on a physical exam. Your health care provider will feel the lump and may shine a light alongside it. If it is a ganglion cyst, a light often shines through it. Your health care provider may order an X-ray, ultrasound, or MRI to rule out other conditions. How is this treated? Ganglion cysts usually go away on their own without treatment. If pain or other symptoms are involved, treatment may be needed. Treatment is also needed if the ganglion cyst limits your movement or if it gets infected. Treatment may include:  Wearing a brace or splint on your wrist or finger.  Taking anti-inflammatory medicine.  Draining fluid from the lump with a needle (aspiration).  Injecting a steroid into the joint.  Surgery to remove the ganglion cyst.  Follow these instructions at home:  Do not press on the ganglion cyst, poke it with a needle, or hit it.  Take medicines only as directed by your health care provider.  Wear your brace or splint as directed by your health care provider.  Watch your ganglion cyst for any changes.  Keep all follow-up visits as directed by your health care provider. This is important. Contact a health care provider if:  Your ganglion cyst becomes larger or more painful.  You have increased redness, red streaks, or swelling.  You have pus coming from the lump.  You have weakness or numbness in the affected area.  You have a fever or chills. This information is not  intended to replace advice given to you by your health care provider. Make sure you discuss any questions you have with your health care provider. Document Released: 06/25/2000 Document Revised: 12/04/2015 Document Reviewed: 12/11/2013 Elsevier Interactive Patient Education  2018 [employer]

## 2017-07-26 NOTE — Assessment & Plan Note (Signed)
Under good control. Given proteinuria, will start low dose lisinopril when she is done breast feeding for renal protection.

## 2017-07-26 NOTE — Assessment & Plan Note (Signed)
A1c 7.0. Will start metformin. Recheck 3 months. Would like to consider ozempic/victoza when not breast feeding to help with sugar control and weight loss.

## 2017-07-26 NOTE — Assessment & Plan Note (Signed)
Doing well. No concerns. Continue to monitor.  

## 2017-07-26 NOTE — Assessment & Plan Note (Signed)
Rechecking levels today. Call with any concerns.  

## 2017-07-26 NOTE — Progress Notes (Signed)
BP 137/84 (BP Location: Left Arm, Patient Position: Sitting, Cuff Size: Large)   Pulse 60   Temp 97.6 F (36.4 C)   Wt 276 lb 8 oz (125.4 kg)   LMP 09/13/2016 (Within Days)   SpO2 97%   Breastfeeding? Yes   BMI 46.01 kg/m    Subjective:    Patient ID: Maria Reyes Eder, female    DOB: 08-13-1990, 27 y.o.   MRN: [id number]  HPI: Maria Reyes Cauble is a 27 y.o. female  Chief Complaint  Patient presents with  . Hypertension  . Depression  . Diabetes  . Hypothyroidism   Here today with her son. He was born at 34 weeks for PROM. They are doing well.   DIABETES- stopped all her medicine when she was in the hospital because her sugars were in range. Insulin discontinued, Not started back on oral agents Hypoglycemic episodes:no Polydipsia/polyuria: no Visual disturbance: no Chest pain: no Paresthesias: no Glucose Monitoring: occasionally  Accucheck frequency: occasionally  Fasting glucose: 160 Taking Insulin?: no Blood Pressure Monitoring: not checking Retinal Examination: Not up to Date Foot Exam: Up to Date Diabetic Education: Completed Pneumovax: Up to Date Influenza: Up to Date Aspirin: no  HYPERTENSION Hypertension status: controlled  Satisfied with current treatment? yes Duration of hypertension: chronic BP monitoring frequency:  not checking BP medication side effects:  Not on anything Medication compliance: Not on anything Previous BP meds:none Aspirin: no Recurrent headaches: no Visual changes: no Palpitations: no Dyspnea: no Chest pain: no Lower extremity edema: no Dizzy/lightheaded: no  HYPOTHYROIDISM- continued on 50mcg synthroid- needs repeat levels today. Stopped taking her thyroid medicine after the hospital. Has not been taking her thyroid medicine in 2 months Thyroid control status:controlled Satisfied with current treatment? yes Medication side effects: no Medication compliance: poor compliance Etiology of hypothyroidism:  Recent dose  adjustment:yes Fatigue: no Cold intolerance: no Heat intolerance: no Weight gain: no Weight loss: no Constipation: no Diarrhea/loose stools: no Palpitations: no Lower extremity edema: no Anxiety/depressed mood: no  DEPRESSION Mood status: controlled Satisfied with current treatment?: yes Symptom severity: mild  Duration of current treatment : Off everything Side effects: no Medication compliance: poor compliance Psychotherapy/counseling: no  Previous psychiatric medications: prozac Depressed mood: no Anxious mood: no Anhedonia: no Significant weight loss or gain: no Insomnia: no  Fatigue: yes Feelings of worthlessness or guilt: no Impaired concentration/indecisiveness: no Suicidal ideations: no Hopelessness: no Crying spells: no Depression screen [facility] 2/9 07/26/2017 12/24/2016 12/09/2016 10/02/2015  Decreased Interest 3 3 0 3  Down, Depressed, Hopeless 0 3 0 1  PHQ - 2 Score 3 6 0 4  Altered sleeping 2 3 - 3  Tired, decreased energy 2 3 - 3  Change in appetite 1 3 - 3  Feeling bad or failure about yourself  0 3 - 3  Trouble concentrating 0 3 - 2  Moving slowly or fidgety/restless 0 2 - 3  Suicidal thoughts 0 2 - 0  PHQ-9 Score 8 25 - 21  Difficult doing work/chores - - - Somewhat difficult   LUMP Duration: 2-3 weeks Location: L wrist Onset: sudden Painful: yes Discomfort: yes Status:  not changing Trauma: no Redness: no Bruising: no Recent infection: no Swollen lymph nodes: no Requesting removal: no History of cancer: no  Relevant past medical, surgical, family and social history reviewed and updated as indicated. Interim medical history since our last visit reviewed. Allergies and medications reviewed and updated.  Review of Systems  Constitutional: Negative.   Respiratory: Negative.   Cardiovascular:  Negative.   Neurological: Negative.   Psychiatric/Behavioral: Negative.     Per HPI unless specifically indicated above     Objective:    BP  137/84 (BP Location: Left Arm, Patient Position: Sitting, Cuff Size: Large)   Pulse 60   Temp 97.6 F (36.4 C)   Wt 276 lb 8 oz (125.4 kg)   LMP 09/13/2016 (Within Days)   SpO2 97%   Breastfeeding? Yes   BMI 46.01 kg/m   Wt Readings from Last 3 Encounters:  07/26/17 276 lb 8 oz (125.4 kg)  02/09/17 290 lb (131.5 kg)  01/18/17 288 lb 4.8 oz (130.8 kg)    Physical Exam  Constitutional: She is oriented to person, place, and time. She appears well-developed and well-nourished. No distress.  HENT:  Head: Normocephalic and atraumatic.  Right Ear: Hearing normal.  Left Ear: Hearing normal.  Nose: Nose normal.  Eyes: Conjunctivae and lids are normal. Right eye exhibits no discharge. Left eye exhibits no discharge. No scleral icterus.  Cardiovascular: Normal rate, regular rhythm, normal heart sounds and intact distal pulses. Exam reveals no gallop and no friction rub.  No murmur heard. Pulmonary/Chest: Effort normal and breath sounds normal. No respiratory distress. She has no wheezes. She has no rales. She exhibits no tenderness.  Musculoskeletal: Normal range of motion.  Neurological: She is alert and oriented to person, place, and time.  Skin: Skin is warm, dry and intact. No rash noted. She is not diaphoretic. No erythema. No pallor.  Ganglion cyst L dorsal wrist, very small about 3mm  Psychiatric: She has a normal mood and affect. Her speech is normal and behavior is normal. Judgment and thought content normal. Cognition and memory are normal.  Nursing note and vitals reviewed.   Results for orders placed or performed during the hospital encounter of 02/09/17  Urinalysis, Routine w reflex microscopic  Result Value Ref Range   Color, Urine YELLOW YELLOW   APPearance HAZY (A) CLEAR   Specific Gravity, Urine 1.020 1.005 - 1.030   pH 5.0 5.0 - 8.0   Glucose, UA >=500 (A) NEGATIVE mg/dL   Hgb urine dipstick NEGATIVE NEGATIVE   Bilirubin Urine NEGATIVE NEGATIVE   Ketones, ur  NEGATIVE NEGATIVE mg/dL   Protein, ur 30 (A) NEGATIVE mg/dL   Nitrite NEGATIVE NEGATIVE   Leukocytes, UA NEGATIVE NEGATIVE   RBC / HPF 0-5 0 - 5 RBC/hpf   WBC, UA 0-5 0 - 5 WBC/hpf   Bacteria, UA RARE (A) NONE SEEN   Squamous Epithelial / LPF 0-5 (A) NONE SEEN   Mucus PRESENT    Ca Oxalate Crys, UA PRESENT   CBG monitoring, ED  Result Value Ref Range   Glucose-Capillary 152 (H) 65 - 99 mg/dL      Assessment & Plan:   Problem List Items Addressed This Visit      Endocrine   DM (diabetes mellitus), type 2 with renal complications (HCC)    A1c 7.0. Will start metformin. Recheck 3 months. Would like to consider ozempic/victoza when not breast feeding to help with sugar control and weight loss.       Relevant Medications   metformin (FORTAMET) 500 MG (OSM) 24 hr tablet   Other Relevant Orders   CBC with Differential/Platelet   Bayer DCA Hb A1c Waived   Comprehensive metabolic panel   Lipid Panel w/o Chol/HDL Ratio   UA/M w/rflx Culture, Routine   Microalbumin, Urine Waived   Ambulatory referral to Ophthalmology   Hypothyroidism in pregnancy  Rechecking levels today. Call with any concerns.       Relevant Orders   CBC with Differential/Platelet   Comprehensive metabolic panel   UA/M w/rflx Culture, Routine   Thyroid Panel With TSH     Genitourinary   Benign hypertensive renal disease - Primary    Under good control. Given proteinuria, will start low dose lisinopril when she is done breast feeding for renal protection.         Other   Morbid obesity (HCC)    Encouraged diet and exercise. Will start metformin. Consider ozempic/victoza after she's done breast feeding      Relevant Medications   metformin (FORTAMET) 500 MG (OSM) 24 hr tablet   Depression    Doing well. No concerns. Continue to monitor.        Other Visit Diagnoses    Immunization due       Flu shot given today.   Relevant Orders   Flu Vaccine QUAD 6+ mos PF IM (Fluarix Quad PF) (Completed)    Ganglion cyst of dorsum of left wrist       Reassured patient. She will call if really bothering her or causing problems.       Follow up plan: Return in about 3 months (around 10/24/2017) for DM follow up.

## 2017-07-26 NOTE — Assessment & Plan Note (Signed)
Encouraged diet and exercise. Will start metformin. Consider ozempic/victoza after she's done breast feeding

## 2017-07-27 LAB — COMPREHENSIVE METABOLIC PANEL
A/G RATIO: 1.3 (ref 1.2–2.2)
ALT: 44 IU/L — AB (ref 0–32)
AST: 32 IU/L (ref 0–40)
Albumin: 4.3 g/dL (ref 3.5–5.5)
Alkaline Phosphatase: 98 IU/L (ref 39–117)
BUN/Creatinine Ratio: 17 (ref 9–23)
BUN: 8 mg/dL (ref 6–20)
Bilirubin Total: <0.2 mg/dL (ref 0.0–1.2)
CALCIUM: 9.7 mg/dL (ref 8.7–10.2)
CO2: 21 mmol/L (ref 20–29)
Chloride: 100 mmol/L (ref 96–106)
Creatinine, Ser: 0.47 mg/dL — ABNORMAL LOW (ref 0.57–1.00)
GFR, EST AFRICAN AMERICAN: 160 mL/min/{1.73_m2} (ref >59)
GFR, EST NON AFRICAN AMERICAN: 139 mL/min/{1.73_m2} (ref >59)
Globulin, Total: 3.2 g/dL (ref 1.5–4.5)
Glucose: 161 mg/dL — ABNORMAL HIGH (ref 65–99)
POTASSIUM: 4.3 mmol/L (ref 3.5–5.2)
Sodium: 138 mmol/L (ref 134–144)
TOTAL PROTEIN: 7.5 g/dL (ref 6.0–8.5)

## 2017-07-27 LAB — THYROID PANEL WITH TSH
FREE THYROXINE INDEX: 2.2 (ref 1.2–4.9)
T3 Uptake Ratio: 26 % (ref 24–39)
T4 TOTAL: 8.6 ug/dL (ref 4.5–12.0)
TSH: 4.26 u[IU]/mL (ref 0.450–4.500)

## 2017-07-27 LAB — UA/M W/RFLX CULTURE, ROUTINE
Bilirubin, UA: NEGATIVE
GLUCOSE, UA: NEGATIVE
Ketones, UA: NEGATIVE
Leukocytes, UA: NEGATIVE
NITRITE UA: NEGATIVE
PH UA: 5 (ref 5.0–7.5)
RBC UA: NEGATIVE
Specific Gravity, UA: 1.025 (ref 1.005–1.030)
UUROB: 0.2 mg/dL (ref 0.2–1.0)

## 2017-07-27 LAB — CBC WITH DIFFERENTIAL/PLATELET
BASOS ABS: 0 10*3/uL (ref 0.0–0.2)
BASOS: 0 %
EOS (ABSOLUTE): 0.2 10*3/uL (ref 0.0–0.4)
EOS: 1 %
HEMOGLOBIN: 11.9 g/dL (ref 11.1–15.9)
Hematocrit: 37.9 % (ref 34.0–46.6)
IMMATURE GRANS (ABS): 0 10*3/uL (ref 0.0–0.1)
Immature Granulocytes: 0 %
Lymphocytes Absolute: 3.6 10*3/uL — ABNORMAL HIGH (ref 0.7–3.1)
Lymphs: 32 %
MCH: 24.3 pg — AB (ref 26.6–33.0)
MCHC: 31.4 g/dL — ABNORMAL LOW (ref 31.5–35.7)
MCV: 78 fL — AB (ref 79–97)
MONOCYTES: 5 %
Monocytes Absolute: 0.5 10*3/uL (ref 0.1–0.9)
NEUTROS ABS: 6.8 10*3/uL (ref 1.4–7.0)
Neutrophils: 62 %
Platelets: 358 10*3/uL (ref 150–379)
RBC: 4.89 x10E6/uL (ref 3.77–5.28)
RDW: 15.2 % (ref 12.3–15.4)
WBC: 11.1 10*3/uL — ABNORMAL HIGH (ref 3.4–10.8)

## 2017-07-27 LAB — LIPID PANEL W/O CHOL/HDL RATIO
CHOLESTEROL TOTAL: 203 mg/dL — AB (ref 100–199)
HDL: 43 mg/dL (ref >39)
LDL Calculated: 104 mg/dL — ABNORMAL HIGH (ref 0–99)
Triglycerides: 279 mg/dL — ABNORMAL HIGH (ref 0–149)
VLDL Cholesterol Cal: 56 mg/dL — ABNORMAL HIGH (ref 5–40)

## 2017-07-27 LAB — MICROSCOPIC EXAMINATION

## 2017-07-27 LAB — BAYER DCA HB A1C WAIVED: HB A1C: 7 % — AB (ref <7.0)

## 2017-07-27 LAB — MICROALBUMIN, URINE WAIVED
Creatinine, Urine Waived: 100 mg/dL (ref 10–300)
MICROALB, UR WAIVED: 150 mg/L — AB (ref 0–19)
Microalb/Creat Ratio: >300 mg/g — ABNORMAL HIGH (ref <30)

## 2017-08-02 ENCOUNTER — Encounter: Payer: Self-pay | Admitting: Family Medicine

## 2017-08-04 MED ORDER — METFORMIN HCL ER (OSM) 500 MG PO TB24: 500.0000 mg | ORAL_TABLET | Freq: Every day | ORAL | 4 refills | Status: DC

## 2017-08-04 NOTE — Telephone Encounter (Signed)
[facility]

## 2017-09-02 LAB — HM DIABETES EYE EXAM

## 2017-10-24 ENCOUNTER — Ambulatory Visit: Payer: BLUE CROSS/BLUE SHIELD | Admitting: Family Medicine

## 2017-11-24 ENCOUNTER — Ambulatory Visit: Payer: BLUE CROSS/BLUE SHIELD | Admitting: Family Medicine

## 2017-11-24 ENCOUNTER — Encounter: Payer: Self-pay | Admitting: Family Medicine

## 2017-11-24 VITALS — BP 155/98 | HR 101 | Wt 290.5 lb

## 2017-11-24 DIAGNOSIS — F332 Major depressive disorder, recurrent severe without psychotic features: Secondary | ICD-10-CM

## 2017-11-24 DIAGNOSIS — E1122 Type 2 diabetes mellitus with diabetic chronic kidney disease: Secondary | ICD-10-CM

## 2017-11-24 DIAGNOSIS — E039 Hypothyroidism, unspecified: Secondary | ICD-10-CM | POA: Diagnosis not present

## 2017-11-24 DIAGNOSIS — O24112 Pre-existing diabetes mellitus, type 2, in pregnancy, second trimester: Secondary | ICD-10-CM

## 2017-11-24 DIAGNOSIS — I10 Essential (primary) hypertension: Secondary | ICD-10-CM

## 2017-11-24 DIAGNOSIS — I129 Hypertensive chronic kidney disease with stage 1 through stage 4 chronic kidney disease, or unspecified chronic kidney disease: Secondary | ICD-10-CM

## 2017-11-24 DIAGNOSIS — O99282 Endocrine, nutritional and metabolic diseases complicating pregnancy, second trimester: Secondary | ICD-10-CM | POA: Diagnosis not present

## 2017-11-24 LAB — BAYER DCA HB A1C WAIVED: HB A1C: 10.4 % — AB (ref <7.0)

## 2017-11-24 MED ORDER — METFORMIN HCL ER (OSM) 500 MG PO TB24: 1000.0000 mg | ORAL_TABLET | Freq: Two times a day (BID) | ORAL | 4 refills | Status: DC

## 2017-11-24 MED ORDER — BUPROPION HCL ER (SR) 150 MG PO TB12: ORAL_TABLET | ORAL | 3 refills | Status: DC

## 2017-11-24 MED ORDER — LISINOPRIL 10 MG PO TABS: 10.0000 mg | ORAL_TABLET | Freq: Every day | ORAL | 3 refills | Status: DC

## 2017-11-24 NOTE — Progress Notes (Signed)
BP (!) 155/98   Pulse (!) 101   Wt 290 lb 8 oz (131.8 kg)   SpO2 98%   BMI 48.34 kg/m    Subjective:    Patient ID: Maria Reyes, female    DOB: Nov 27, 1990, 27 y.o.   MRN: [id number]  HPI: Maria Reyes is a 27 y.o. female  Chief Complaint  Patient presents with  . Follow-up  . Diabetes  . Depression   HYPERTENSION Hypertension status: uncontrolled  Satisfied with current treatment? yes Duration of hypertension: chronic BP monitoring frequency:  not checking BP medication side effects:  no Medication compliance: excellent compliance Aspirin: no Recurrent headaches: no Visual changes: no Palpitations: yes Dyspnea: yes Chest pain: no Lower extremity edema: no Dizzy/lightheaded: no  HYPOTHYROIDISM Thyroid control status:stable Satisfied with current treatment? yes Medication side effects: no Medication compliance: poor Recent dose adjustment:no Fatigue: no Cold intolerance: no Heat intolerance: yes Weight gain: yes Weight loss: no Constipation: no Diarrhea/loose stools: no Palpitations: no Lower extremity edema: no Anxiety/depressed mood: yes  DEPRESSION Mood status: controlled Satisfied with current treatment?: no Symptom severity: moderate  Duration of current treatment : currently on anything Side effects: no Medication compliance: Not on anything Psychotherapy/counseling: no  Depressed mood: yes Anxious mood: yes Anhedonia: no Significant weight loss or gain: yes Insomnia: no  Fatigue: yes Feelings of worthlessness or guilt: no Impaired concentration/indecisiveness: no Suicidal ideations: no Hopelessness: no Crying spells: no Depression screen [facility] 2/9 11/24/2017 07/26/2017 12/24/2016 12/09/2016 10/02/2015  Decreased Interest 0 3  Down, Depressed, Hopeless 1 0 3 0 1  PHQ - 2 Score 0 4  Altered sleeping - 3  Tired, decreased energy - 3  Change in appetite - 3  Feeling bad or failure about yourself  2 0 3 - 3    Trouble concentrating 1 0 3 - 2  Moving slowly or fidgety/restless 0 0 2 - 3  Suicidal thoughts 0 0 2 - 0  PHQ-9 Score - 21  Difficult doing work/chores - - - - Somewhat difficult   DIABETES Hypoglycemic episodes:no Polydipsia/polyuria: yes Visual disturbance: no Chest pain: no Paresthesias: no Glucose Monitoring: yes  Accucheck frequency: Daily Taking Insulin?: no Blood Pressure Monitoring: not checking Retinal Examination: Up to Date Foot Exam: Not up to Date Diabetic Education: Completed Pneumovax: Up to Date Influenza: Up to Date Aspirin: no   Relevant past medical, surgical, family and social history reviewed and updated as indicated. Interim medical history since our last visit reviewed. Allergies and medications reviewed and updated.  Review of Systems  Constitutional: Negative.   Respiratory: Negative.   Cardiovascular: Negative.   Neurological: Negative.   Psychiatric/Behavioral: Positive for dysphoric mood. Negative for agitation, behavioral problems, confusion, decreased concentration, hallucinations, self-injury and sleep disturbance. The patient is nervous/anxious. The patient is not hyperactive.     Per HPI unless specifically indicated above     Objective:    BP (!) 155/98   Pulse (!) 101   Wt 290 lb 8 oz (131.8 kg)   SpO2 98%   BMI 48.34 kg/m   Wt Readings from Last 3 Encounters:  11/24/17 290 lb 8 oz (131.8 kg)  07/26/17 276 lb 8 oz (125.4 kg)  02/09/17 290 lb (131.5 kg)    Physical Exam  Constitutional: She is oriented to person, place, and time. She appears well-developed and well-nourished. No distress.  HENT:  Head: Normocephalic and atraumatic.  Right Ear: Hearing normal.  Left Ear: Hearing normal.  Nose: Nose normal.  Eyes: Conjunctivae and lids are normal. Right eye exhibits no discharge. Left eye exhibits no discharge. No scleral icterus.  Cardiovascular: Normal rate, regular rhythm, normal heart sounds and intact distal  pulses. Exam reveals no gallop and no friction rub.  No murmur heard. Pulmonary/Chest: Effort normal and breath sounds normal. No stridor. No respiratory distress. She has no wheezes. She has no rales. She exhibits no tenderness.  Musculoskeletal: Normal range of motion.  Neurological: She is alert and oriented to person, place, and time.  Skin: Skin is warm, dry and intact. Capillary refill takes less than 2 seconds. No rash noted. She is not diaphoretic. No erythema. No pallor.  Psychiatric: She has a normal mood and affect. Her speech is normal and behavior is normal. Judgment and thought content normal. Cognition and memory are normal.  Nursing note and vitals reviewed.   Results for orders placed or performed in visit on 11/24/17  Bayer DCA Hb A1c Waived  Result Value Ref Range   Bayer DCA Hb A1c Waived 10.4 (H) <7.0 %      Assessment & Plan:   Problem List Items Addressed This Visit      Endocrine   Type 2 diabetes mellitus with chronic kidney disease, without long-term current use of insulin (HCC) - Primary    Not under good control. Will increase metformin to  BID and recheck 1 month.       Relevant Medications   lisinopril (PRINIVIL,ZESTRIL) 10 MG tablet   metformin (FORTAMET) 500 MG (OSM) 24 hr tablet   Hypothyroidism during pregnancy in second trimester    Rechecking levels next visit if not starting to feel better.         Genitourinary   Benign hypertensive renal disease    Not under good control. Will start lisinopril and recheck 1 month. Call with any concerns.         Other   Depression    Not under good control. Will start wellbutrin and recheck 1 month. Call with any concerns.       Relevant Medications   buPROPion (WELLBUTRIN SR) 150 MG 12 hr tablet    Other Visit Diagnoses    Essential hypertension       Relevant Medications   lisinopril (PRINIVIL,ZESTRIL) 10 MG tablet       Follow up plan: Return in about 1 month (around  12/22/2017).

## 2017-11-24 NOTE — Assessment & Plan Note (Signed)
Not under good control. Will increase metformin to  BID and recheck 1 month.

## 2017-11-24 NOTE — Assessment & Plan Note (Signed)
Not under good control. Will start wellbutrin and recheck 1 month. Call with any concerns.  

## 2017-11-24 NOTE — Assessment & Plan Note (Signed)
Not under good control. Will start lisinopril and recheck 1 month. Call with any concerns.  

## 2017-11-24 NOTE — Assessment & Plan Note (Signed)
Rechecking levels next visit if not starting to feel better.

## 2017-12-02 ENCOUNTER — Encounter: Payer: Self-pay | Admitting: Family Medicine

## 2017-12-02 MED ORDER — METFORMIN HCL ER (OSM) 500 MG PO TB24: 1000.0000 mg | ORAL_TABLET | Freq: Two times a day (BID) | ORAL | 4 refills | Status: DC

## 2017-12-07 NOTE — Telephone Encounter (Signed)
Relation to pt: self Call back number: [phone] Pharmacy: [id number] - [place], [place] - [address]  Reason for call:  Pharmacy informed patient PCP changed / increase metformin (FORTAMET) and instead of her paying $4 now the new RX is $90, please advise thru mychart

## 2017-12-11 ENCOUNTER — Encounter: Payer: Self-pay | Admitting: Family Medicine

## 2017-12-27 ENCOUNTER — Encounter: Payer: Self-pay | Admitting: Family Medicine

## 2017-12-27 ENCOUNTER — Ambulatory Visit: Payer: BLUE CROSS/BLUE SHIELD | Admitting: Family Medicine

## 2017-12-27 VITALS — BP 130/90 | HR 98 | Temp 97.9°F | Wt 290.4 lb

## 2017-12-27 DIAGNOSIS — F332 Major depressive disorder, recurrent severe without psychotic features: Secondary | ICD-10-CM | POA: Diagnosis not present

## 2017-12-27 DIAGNOSIS — O99282 Endocrine, nutritional and metabolic diseases complicating pregnancy, second trimester: Secondary | ICD-10-CM

## 2017-12-27 DIAGNOSIS — E1122 Type 2 diabetes mellitus with diabetic chronic kidney disease: Secondary | ICD-10-CM | POA: Diagnosis not present

## 2017-12-27 DIAGNOSIS — I129 Hypertensive chronic kidney disease with stage 1 through stage 4 chronic kidney disease, or unspecified chronic kidney disease: Secondary | ICD-10-CM

## 2017-12-27 DIAGNOSIS — E039 Hypothyroidism, unspecified: Secondary | ICD-10-CM | POA: Diagnosis not present

## 2017-12-27 MED ORDER — BUPROPION HCL ER (XL) 300 MG PO TB24: 300.0000 mg | ORAL_TABLET | Freq: Every day | ORAL | 3 refills | Status: DC

## 2017-12-27 MED ORDER — METFORMIN HCL ER 500 MG PO TB24: 1000.0000 mg | ORAL_TABLET | Freq: Two times a day (BID) | ORAL | 1 refills | Status: DC

## 2017-12-27 MED ORDER — LISINOPRIL 20 MG PO TABS: 20.0000 mg | ORAL_TABLET | Freq: Every day | ORAL | 3 refills | Status: DC

## 2017-12-27 NOTE — Assessment & Plan Note (Signed)
Has not been able to get her metformin. Will give printed copy today and recheck tolerance in 1 month. Call with any concerns.

## 2017-12-27 NOTE — Assessment & Plan Note (Signed)
Still not doing great. Will increase wellbutrin XR to 300mg  daily and recheck in 1 month. Call with any concerns.

## 2017-12-27 NOTE — Assessment & Plan Note (Signed)
Currently not taking her medicine. Will recheck levels today and treat as needed.

## 2017-12-27 NOTE — Progress Notes (Signed)
BP 130/90 (BP Location: Right Arm, Cuff Size: Normal)   Pulse 98   Temp 97.9 F (36.6 C)   Wt 290 lb 6 oz (131.7 kg)   SpO2 97%   BMI 48.32 kg/m    Subjective:    Patient ID: Maria Reyes, female    DOB: 01-30-1991, 27 y.o.   MRN: [id number]  HPI: Maria Reyes is a 27 y.o. female  Chief Complaint  Patient presents with  . Diabetes    Patient states that she still does not have her Metformin, could she be given a paper copy to take to to pharmacy  . Depression   DEPRESSION- started on wellbutrin last visit.  Mood status: stable Satisfied with current treatment?: no Symptom severity: moderate  Duration of current treatment : 1 month Side effects: no Medication compliance: excellent compliance Psychotherapy/counseling: no  Previous psychiatric medications: wellbutrin Depressed mood: yes Anxious mood: yes Anhedonia: no Significant weight loss or gain: no Insomnia: no  Fatigue: yes Feelings of worthlessness or guilt: no Impaired concentration/indecisiveness: no Suicidal ideations: no Hopelessness: no Crying spells: no Depression screen [facility] 2/9 12/27/2017 11/24/2017 07/26/2017 12/24/2016 12/09/2016  Decreased Interest 1 3 3 3  0  Down, Depressed, Hopeless 1 1 0 3 0  PHQ - 2 Score 2 4 3 6  0  Altered sleeping 3 3 2 3  -  Tired, decreased energy 3 3 2 3  -  Change in appetite 3 3 1 3  -  Feeling bad or failure about yourself  2 2 0 3 -  Trouble concentrating 1 1 0 3 -  Moving slowly or fidgety/restless 1 0 0 2 -  Suicidal thoughts 0 0 0 2 -  PHQ-9 Score 15 16 8 25  -  Difficult doing work/chores Very difficult - - - -    DIABETES- started on metformin BID 1 month ago. Here for follow up. Tolerating it well. Feeling good. No concerns.  Hypoglycemic episodes:no Polydipsia/polyuria: yes Visual disturbance: yes Chest pain: no Paresthesias: no Glucose Monitoring: yes  Accucheck frequency: BID Taking Insulin?: no Blood Pressure Monitoring: not checking Retinal  Examination: Up to Date Foot Exam: Done today Diabetic Education: Completed Pneumovax: Up to Date Influenza: Up to Date Aspirin: no  HYPERTENSION- started on lisinopril last visit Hypertension status: better  Satisfied with current treatment? yes Duration of hypertension: chronic BP monitoring frequency:  not checking BP range:  BP medication side effects:  no Medication compliance: excellent compliance Previous BP meds: lisinopril Aspirin: no Recurrent headaches: yes Visual changes: no Palpitations: no Dyspnea: no Chest pain: no Lower extremity edema: no Dizzy/lightheaded: no   Relevant past medical, surgical, family and social history reviewed and updated as indicated. Interim medical history since our last visit reviewed. Allergies and medications reviewed and updated.  Review of Systems  Constitutional: Negative.   Respiratory: Negative.   Cardiovascular: Negative.   Gastrointestinal: Negative.   Musculoskeletal: Negative.   Skin: Negative.        Hair loss  Neurological: Positive for headaches. Negative for dizziness, tremors, seizures, syncope, facial asymmetry, speech difficulty, weakness, light-headedness and numbness.  Psychiatric/Behavioral: Positive for dysphoric mood. Negative for agitation, behavioral problems, confusion, decreased concentration, hallucinations, self-injury, sleep disturbance and suicidal ideas. The patient is not nervous/anxious and is not hyperactive.     Per HPI unless specifically indicated above     Objective:    BP 130/90 (BP Location: Right Arm, Cuff Size: Normal)   Pulse 98   Temp 97.9 F (36.6 C)   Wt 290 lb  6 oz (131.7 kg)   SpO2 97%   BMI 48.32 kg/m   Wt Readings from Last 3 Encounters:  12/27/17 290 lb 6 oz (131.7 kg)  11/24/17 290 lb 8 oz (131.8 kg)  07/26/17 276 lb 8 oz (125.4 kg)    Physical Exam  Constitutional: She is oriented to person, place, and time. She appears well-developed and well-nourished. No  distress.  HENT:  Head: Normocephalic and atraumatic.  Right Ear: Hearing normal.  Left Ear: Hearing normal.  Nose: Nose normal.  Eyes: Conjunctivae and lids are normal. Right eye exhibits no discharge. Left eye exhibits no discharge. No scleral icterus.  Cardiovascular: Normal rate, regular rhythm, normal heart sounds and intact distal pulses. Exam reveals no gallop and no friction rub.  No murmur heard. Pulmonary/Chest: Effort normal and breath sounds normal. No stridor. No respiratory distress. She has no wheezes. She has no rales. She exhibits no tenderness.  Musculoskeletal: Normal range of motion.  Neurological: She is alert and oriented to person, place, and time.  Skin: Skin is warm, dry and intact. Capillary refill takes less than 2 seconds. No rash noted. She is not diaphoretic. No erythema. No pallor.  Psychiatric: She has a normal mood and affect. Her speech is normal and behavior is normal. Judgment and thought content normal. Cognition and memory are normal.  Nursing note and vitals reviewed.   Results for orders placed or performed in visit on 11/24/17  Bayer DCA Hb A1c Waived  Result Value Ref Range   HB A1C (BAYER DCA - WAIVED) 10.4 (H) <7.0 %      Assessment & Plan:   Problem List Items Addressed This Visit      Endocrine   Type 2 diabetes mellitus with chronic kidney disease, without long-term current use of insulin (HCC) - Primary    Has not been able to get her metformin. Will give printed copy today and recheck tolerance in 1 month. Call with any concerns.       Relevant Medications   metFORMIN (GLUCOPHAGE-XR) 500 MG 24 hr tablet   lisinopril (PRINIVIL,ZESTRIL) 20 MG tablet   Other Relevant Orders   Basic metabolic panel   Hypothyroidism during pregnancy in second trimester    Currently not taking her medicine. Will recheck levels today and treat as needed.       Relevant Orders   Thyroid Panel With TSH     Genitourinary   Benign hypertensive renal  disease    Not doing great- but better. Will increase to 20mg  on lisinopril and recheck 1 month.       Relevant Orders   Basic metabolic panel     Other   Depression    Still not doing great. Will increase wellbutrin XR to 300mg  daily and recheck in 1 month. Call with any concerns.       Relevant Medications   buPROPion (WELLBUTRIN XL) 300 MG 24 hr tablet       Follow up plan: Return in about 1 month (around 01/24/2018) for follow up mood/blood pressure/DM.

## 2017-12-27 NOTE — Assessment & Plan Note (Signed)
Not doing great- but better. Will increase to 20mg  on lisinopril and recheck 1 month.

## 2017-12-28 ENCOUNTER — Encounter: Payer: Self-pay | Admitting: Family Medicine

## 2017-12-28 LAB — BASIC METABOLIC PANEL
BUN / CREAT RATIO: 17 (ref 9–23)
BUN: 9 mg/dL (ref 6–20)
CO2: 24 mmol/L (ref 20–29)
Calcium: 9.8 mg/dL (ref 8.7–10.2)
Chloride: 94 mmol/L — ABNORMAL LOW (ref 96–106)
Creatinine, Ser: 0.52 mg/dL — ABNORMAL LOW (ref 0.57–1.00)
GFR calc Af Amer: 154 mL/min/{1.73_m2} (ref >59)
GFR calc non Af Amer: 133 mL/min/{1.73_m2} (ref >59)
GLUCOSE: 275 mg/dL — AB (ref 65–99)
POTASSIUM: 4.6 mmol/L (ref 3.5–5.2)
SODIUM: 140 mmol/L (ref 134–144)

## 2017-12-28 LAB — THYROID PANEL WITH TSH
Free Thyroxine Index: 2.4 (ref 1.2–4.9)
T3 Uptake Ratio: 24 % (ref 24–39)
T4, Total: 10.2 ug/dL (ref 4.5–12.0)
TSH: 4.05 u[IU]/mL (ref 0.450–4.500)

## 2017-12-30 ENCOUNTER — Other Ambulatory Visit: Payer: Self-pay | Admitting: Family Medicine

## 2017-12-30 MED ORDER — LEVOTHYROXINE SODIUM 25 MCG PO TABS: ORAL_TABLET | ORAL | 3 refills | Status: DC

## 2018-02-03 ENCOUNTER — Ambulatory Visit: Payer: BLUE CROSS/BLUE SHIELD | Admitting: Family Medicine

## 2018-02-13 ENCOUNTER — Encounter: Payer: Self-pay | Admitting: Family Medicine

## 2018-02-16 NOTE — Telephone Encounter (Signed)
Left message on machine for pt to return call to the office.  

## 2018-02-22 NOTE — Telephone Encounter (Signed)
Pt called back stating she just received VM today from 02/16/18. Pt said she did not go to the ER because she had her child. She said the bleeding has since stopped and is feeling fine. Pt declined needing appt at this time unless recommended by provider. Phone # [phone]

## 2018-03-14 ENCOUNTER — Ambulatory Visit: Payer: BLUE CROSS/BLUE SHIELD | Admitting: Family Medicine

## 2018-04-09 NOTE — Progress Notes (Deleted)
There were no vitals taken for this visit.   Subjective:    Patient ID: Maria Reyes, female    DOB: 11-05-1990, 27 y.o.   MRN: [id number]  HPI: Maria Reyes is a 27 y.o. female  No chief complaint on file.  DEPRESSION Mood status: {Blank single:19197::"controlled","uncontrolled","better","worse","exacerbated","stable"} Satisfied with current treatment?: {Blank single:19197::"yes","no"} Symptom severity: {Blank single:19197::"mild","moderate","severe"}  Duration of current treatment : {Blank single:19197::"chronic","months","years"} Side effects: {Blank single:19197::"yes","no"} Medication compliance: {Blank single:19197::"excellent compliance","good compliance","fair compliance","poor compliance"} Psychotherapy/counseling: {Blank single:19197::"yes","no"} {Blank single:19197::"current","in the past"} Previous psychiatric medications: {Blank multiple:19196::"abilify","amitryptiline","buspar","celexa","cymbalta","depakote","effexor","lamictal","lexapro","lithium","nortryptiline","paxil","prozac","pristiq (desvenlafaxine","seroquel","wellbutrin","zoloft","zyprexa"} Depressed mood: {Blank single:19197::"yes","no"} Anxious mood: {Blank single:19197::"yes","no"} Anhedonia: {Blank single:19197::"yes","no"} Significant weight loss or gain: {Blank single:19197::"yes","no"} Insomnia: {Blank single:19197::"yes","no"} {Blank single:19197::"hard to fall asleep","hard to stay asleep"} Fatigue: {Blank single:19197::"yes","no"} Feelings of worthlessness or guilt: {Blank single:19197::"yes","no"} Impaired concentration/indecisiveness: {Blank single:19197::"yes","no"} Suicidal ideations: {Blank single:19197::"yes","no"} Hopelessness: {Blank single:19197::"yes","no"} Crying spells: {Blank single:19197::"yes","no"} Depression screen [facility] 2/9 12/27/2017 11/24/2017 07/26/2017 12/24/2016 12/09/2016  Decreased Interest 1 3 3 3  0  Down, Depressed, Hopeless 1 1 0 3 0  PHQ - 2 Score 2 4 3 6  0  Altered  sleeping 3 3 2 3  -  Tired, decreased energy 3 3 2 3  -  Change in appetite 3 3 1 3  -  Feeling bad or failure about yourself  2 2 0 3 -  Trouble concentrating 1 1 0 3 -  Moving slowly or fidgety/restless 1 0 0 2 -  Suicidal thoughts 0 0 0 2 -  PHQ-9 Score 15 16 8 25  -  Difficult doing work/chores Very difficult - - - -    DIABETES Hypoglycemic episodes:{Blank single:19197::"yes","no"} Polydipsia/polyuria: {Blank single:19197::"yes","no"} Visual disturbance: {Blank single:19197::"yes","no"} Chest pain: {Blank single:19197::"yes","no"} Paresthesias: {Blank single:19197::"yes","no"} Glucose Monitoring: {Blank single:19197::"yes","no"}  Accucheck frequency: {Blank single:19197::"Not Checking","Daily","BID","TID"}  Fasting glucose:  Post prandial:  Evening:  Before meals: Taking Insulin?: {Blank single:19197::"yes","no"}  Long acting insulin:  Short acting insulin: Blood Pressure Monitoring: {Blank single:19197::"not checking","rarely","daily","weekly","monthly","a few times a day","a few times a week","a few times a month"} Retinal Examination: {Blank single:19197::"Up to Date","Not up to Date"} Foot Exam: {Blank single:19197::"Up to Date","Not up to Date"} Diabetic Education: {Blank single:19197::"Completed","Not Completed"} Pneumovax: {Blank single:19197::"Up to Date","Not up to Date","unknown"} Influenza: {Blank single:19197::"Up to Date","Not up to Date","unknown"} Aspirin: {Blank single:19197::"yes","no"}  HYPERTENSION Hypertension status: {Blank single:19197::"controlled","uncontrolled","better","worse","exacerbated","stable"}  Satisfied with current treatment? {Blank single:19197::"yes","no"} Duration of hypertension: {Blank single:19197::"chronic","months","years"} BP monitoring frequency:  {Blank single:19197::"not checking","rarely","daily","weekly","monthly","a few times a day","a few times a week","a few times a month"} BP range:  BP medication side effects:  {Blank  single:19197::"yes","no"} Medication compliance: {Blank single:19197::"excellent compliance","good compliance","fair compliance","poor compliance"} Previous BP meds:{Blank multiple:19196::"none","amlodipine","amlodipine/benazepril","atenolol","benazepril","benazepril/HCTZ","bisoprolol (bystolic)","carvedilol","chlorthalidone","clonidine","diltiazem","exforge HCT","HCTZ","irbesartan (avapro)","labetalol","lisinopril","lisinopril-HCTZ","losartan (cozaar)","methyldopa","nifedipine","olmesartan (benicar)","olmesartan-HCTZ","quinapril","ramipril","spironalactone","tekturna","valsartan","valsartan-HCTZ","verapamil"} Aspirin: {Blank single:19197::"yes","no"} Recurrent headaches: {Blank single:19197::"yes","no"} Visual changes: {Blank single:19197::"yes","no"} Palpitations: {Blank single:19197::"yes","no"} Dyspnea: {Blank single:19197::"yes","no"} Chest pain: {Blank single:19197::"yes","no"} Lower extremity edema: {Blank single:19197::"yes","no"} Dizzy/lightheaded: {Blank single:19197::"yes","no"}  Relevant past medical, surgical, family and social history reviewed and updated as indicated. Interim medical history since our last visit reviewed. Allergies and medications reviewed and updated.  Review of Systems  Per HPI unless specifically indicated above     Objective:    There were no vitals taken for this visit.  Wt Readings from Last 3 Encounters:  12/27/17 290 lb 6 oz (131.7 kg)  11/24/17 290 lb 8 oz (131.8 kg)  07/26/17 276 lb 8 oz (125.4 kg)    Physical Exam  Results for orders placed or performed in visit on 12/27/17  Basic metabolic panel  Result Value Ref Range   Glucose 275 (H) 65 - 99 mg/dL   BUN  9 6 - 20 mg/dL   Creatinine, Ser 0.98 (L) 0.57 - 1.00 mg/dL   GFR calc non Af Amer 133 >59 mL/min/1.73   GFR calc Af Amer 154 >59 mL/min/1.73   BUN/Creatinine Ratio 17 9 - 23   Sodium 140 134 - 144 mmol/L   Potassium 4.6 3.5 - 5.2 mmol/L   Chloride 94 (L) 96 - 106 mmol/L   CO2 24  20 - 29 mmol/L   Calcium 9.8 8.7 - 10.2 mg/dL  Thyroid Panel With TSH  Result Value Ref Range   TSH 4.050 0.450 - 4.500 uIU/mL   T4, Total 10.2 4.5 - 12.0 ug/dL   T3 Uptake Ratio 24 24 - 39 %   Free Thyroxine Index 2.4 1.2 - 4.9      Assessment & Plan:   Problem List Items Addressed This Visit    None       Follow up plan: No follow-ups on file.

## 2018-04-10 ENCOUNTER — Ambulatory Visit: Payer: BLUE CROSS/BLUE SHIELD | Admitting: Family Medicine

## 2018-04-18 ENCOUNTER — Encounter: Payer: Self-pay | Admitting: Family Medicine

## 2018-04-18 ENCOUNTER — Ambulatory Visit: Payer: BLUE CROSS/BLUE SHIELD | Admitting: Family Medicine

## 2018-04-18 VITALS — BP 145/92 | HR 112 | Wt 279.0 lb

## 2018-04-18 DIAGNOSIS — I129 Hypertensive chronic kidney disease with stage 1 through stage 4 chronic kidney disease, or unspecified chronic kidney disease: Secondary | ICD-10-CM

## 2018-04-18 DIAGNOSIS — Z23 Encounter for immunization: Secondary | ICD-10-CM

## 2018-04-18 DIAGNOSIS — E039 Hypothyroidism, unspecified: Secondary | ICD-10-CM

## 2018-04-18 DIAGNOSIS — E1122 Type 2 diabetes mellitus with diabetic chronic kidney disease: Secondary | ICD-10-CM

## 2018-04-18 DIAGNOSIS — IMO0002 Reserved for concepts with insufficient information to code with codable children: Secondary | ICD-10-CM

## 2018-04-18 DIAGNOSIS — E1129 Type 2 diabetes mellitus with other diabetic kidney complication: Secondary | ICD-10-CM

## 2018-04-18 DIAGNOSIS — F332 Major depressive disorder, recurrent severe without psychotic features: Secondary | ICD-10-CM | POA: Diagnosis not present

## 2018-04-18 DIAGNOSIS — R110 Nausea: Secondary | ICD-10-CM

## 2018-04-18 DIAGNOSIS — E1165 Type 2 diabetes mellitus with hyperglycemia: Secondary | ICD-10-CM

## 2018-04-18 LAB — MICROSCOPIC EXAMINATION

## 2018-04-18 LAB — UA/M W/RFLX CULTURE, ROUTINE
Bilirubin, UA: NEGATIVE
KETONES UA: NEGATIVE
Leukocytes, UA: NEGATIVE
NITRITE UA: NEGATIVE
RBC, UA: NEGATIVE
SPEC GRAV UA: 1.02 (ref 1.005–1.030)
Urobilinogen, Ur: 0.2 mg/dL (ref 0.2–1.0)
pH, UA: 5 (ref 5.0–7.5)

## 2018-04-18 LAB — MICROALBUMIN, URINE WAIVED
Creatinine, Urine Waived: 50 mg/dL (ref 10–300)
Microalb, Ur Waived: 150 mg/L — ABNORMAL HIGH (ref 0–19)
Microalb/Creat Ratio: >300 mg/g — ABNORMAL HIGH (ref <30)

## 2018-04-18 LAB — BAYER DCA HB A1C WAIVED: HB A1C (BAYER DCA - WAIVED): 13.8 % — ABNORMAL HIGH (ref <7.0)

## 2018-04-18 LAB — PREGNANCY, URINE: Preg Test, Ur: NEGATIVE

## 2018-04-18 MED ORDER — SEMAGLUTIDE(0.25 OR 0.5MG/DOS) 2 MG/1.5ML ~~LOC~~ SOPN: 0.5000 mg | PEN_INJECTOR | SUBCUTANEOUS | 3 refills | Status: DC

## 2018-04-18 MED ORDER — LISINOPRIL 30 MG PO TABS: 30.0000 mg | ORAL_TABLET | Freq: Every day | ORAL | 1 refills | Status: DC

## 2018-04-18 MED ORDER — NORETHINDRONE 0.35 MG PO TABS: 1.0000 | ORAL_TABLET | Freq: Every day | ORAL | 12 refills | Status: DC

## 2018-04-18 NOTE — Assessment & Plan Note (Signed)
Doing well off medicine. Call with any concerns. Continue to monitor.  

## 2018-04-18 NOTE — Progress Notes (Signed)
BP (!) 145/92   Pulse (!) 112   Wt 279 lb (126.6 kg)   SpO2 95%   BMI 46.43 kg/m    Subjective:    Patient ID: Maria Reyes, female    DOB: Aug 19, 1990, 27 y.o.   MRN: [id number]  HPI: Maria Reyes is a 27 y.o. female  Chief Complaint  Patient presents with  . Follow-up  . Diabetes  . Depression   DIABETES Hypoglycemic episodes:no Polydipsia/polyuria: no Visual disturbance: no Chest pain: no Paresthesias: no Glucose Monitoring: yes  Accucheck frequency: Daily  Fasting glucose: 200-300 Taking Insulin?: no Blood Pressure Monitoring: not checking Retinal Examination: Up to Date Foot Exam: Up to Date Diabetic Education: Completed Pneumovax: Up to Date Influenza: Up to Date Aspirin: no  DEPRESSION- doing well off her medicine Mood status: better Satisfied with current treatment?: yes Symptom severity: mild  Duration of current treatment : off medicine Psychotherapy/counseling: no  Previous psychiatric medications: wellbutrin Depressed mood: no Anxious mood: no Anhedonia: no Significant weight loss or gain: no Insomnia: no  Fatigue: no Feelings of worthlessness or guilt: no Impaired concentration/indecisiveness: no Suicidal ideations: no Hopelessness: no Crying spells: no Depression screen [facility] 2/9 04/18/2018 12/27/2017 11/24/2017 07/26/2017 12/24/2016  Decreased Interest 0 1 3 3 3   Down, Depressed, Hopeless 0 1 1 0 3  PHQ - 2 Score 0 2 4 3 6   Altered sleeping 3 3 3 2 3   Tired, decreased energy 1 3 3 2 3   Change in appetite 1 3 3 1 3   Feeling bad or failure about yourself  0 2 2 0 3  Trouble concentrating 0 1 1 0 3  Moving slowly or fidgety/restless 0 1 0 0 2  Suicidal thoughts 0 0 0 0 2  PHQ-9 Score 5 15 16 8 25   Difficult doing work/chores Not difficult at all Very difficult - - -   HYPERTENSION Hypertension status: uncontrolled  Satisfied with current treatment? yes Duration of hypertension: chronic BP monitoring frequency:  not checking BP  medication side effects:  no Medication compliance: fair compliance Previous BP meds:lisinopril Aspirin: no Recurrent headaches: no Visual changes: no Palpitations: no Dyspnea: no Chest pain: no Lower extremity edema: no Dizzy/lightheaded: no  Relevant past medical, surgical, family and social history reviewed and updated as indicated. Interim medical history since our last visit reviewed. Allergies and medications reviewed and updated.  Review of Systems  Constitutional: Negative.   Respiratory: Negative.   Cardiovascular: Negative.   Gastrointestinal: Positive for nausea (for last 2 weeks) and vomiting (for last 2 weeks). Negative for abdominal distention, abdominal pain, anal bleeding, blood in stool, constipation, diarrhea and rectal pain.  Psychiatric/Behavioral: Negative.     Per HPI unless specifically indicated above     Objective:    BP (!) 145/92   Pulse (!) 112   Wt 279 lb (126.6 kg)   SpO2 95%   BMI 46.43 kg/m   Wt Readings from Last 3 Encounters:  04/18/18 279 lb (126.6 kg)  12/27/17 290 lb 6 oz (131.7 kg)  11/24/17 290 lb 8 oz (131.8 kg)    Physical Exam  Constitutional: She is oriented to person, place, and time. She appears well-developed and well-nourished. No distress.  HENT:  Head: Normocephalic and atraumatic.  Right Ear: Hearing normal.  Left Ear: Hearing normal.  Nose: Nose normal.  Eyes: Conjunctivae and lids are normal. Right eye exhibits no discharge. Left eye exhibits no discharge. No scleral icterus.  Cardiovascular: Normal rate, regular rhythm, normal heart sounds  and intact distal pulses. Exam reveals no gallop and no friction rub.  No murmur heard. Pulmonary/Chest: Effort normal and breath sounds normal. No stridor. No respiratory distress. She has no wheezes. She has no rales. She exhibits no tenderness.  Musculoskeletal: Normal range of motion.  Neurological: She is alert and oriented to person, place, and time.  Skin: Skin is warm,  dry and intact. Capillary refill takes less than 2 seconds. No rash noted. She is not diaphoretic. No erythema. No pallor.  Psychiatric: She has a normal mood and affect. Her speech is normal and behavior is normal. Judgment and thought content normal. Cognition and memory are normal.  Nursing note and vitals reviewed.   Results for orders placed or performed in visit on 12/27/17  Basic metabolic panel  Result Value Ref Range   Glucose 275 (H) 65 - 99 mg/dL   BUN 9 6 - 20 mg/dL   Creatinine, Ser 1.61 (L) 0.57 - 1.00 mg/dL   GFR calc non Af Amer 133 >59 mL/min/1.73   GFR calc Af Amer 154 >59 mL/min/1.73   BUN/Creatinine Ratio 17 9 - 23   Sodium 140 134 - 144 mmol/L   Potassium 4.6 3.5 - 5.2 mmol/L   Chloride 94 (L) 96 - 106 mmol/L   CO2 24 20 - 29 mmol/L   Calcium 9.8 8.7 - 10.2 mg/dL  Thyroid Panel With TSH  Result Value Ref Range   TSH 4.050 0.450 - 4.500 uIU/mL   T4, Total 10.2 4.5 - 12.0 ug/dL   T3 Uptake Ratio 24 24 - 39 %   Free Thyroxine Index 2.4 1.2 - 4.9      Assessment & Plan:   Problem List Items Addressed This Visit      Endocrine   Type 2 diabetes mellitus, uncontrolled, with renal complications (HCC) - Primary    Not under good control. A1c up to 13.8. Will start ozempic and recheck in 1 month. Call with any concerns.       Relevant Medications   Semaglutide,0.25 or 0.5MG /DOS, (OZEMPIC, 0.25 OR 0.5 MG/DOSE,) 2 MG/1.5ML SOPN   lisinopril (PRINIVIL,ZESTRIL) 30 MG tablet   Hypothyroidism    Rechecking levels today. Await results. Call with any concerns.       Relevant Orders   CBC with Differential/Platelet   Comprehensive metabolic panel   TSH   UA/M w/rflx Culture, Routine     Genitourinary   Benign hypertensive renal disease    Not under good control. Will increase her lisinopril to 30mg  and recheck 1 month. Call with any concerns.       Relevant Orders   CBC with Differential/Platelet   Comprehensive metabolic panel   Microalbumin, Urine Waived     TSH   UA/M w/rflx Culture, Routine     Other   Depression    Doing well off medicine. Call with any concerns. Continue to monitor.       Relevant Orders   CBC with Differential/Platelet   Comprehensive metabolic panel   TSH   UA/M w/rflx Culture, Routine    Other Visit Diagnoses    Nausea       Pregnancy negative. Likely due to elevated sugars. Continue to monitor.    Relevant Orders   Pregnancy, urine   Needs flu shot       Flu shot given today.   Relevant Orders   Flu Vaccine QUAD 6+ mos PF IM (Fluarix Quad PF) (Completed)       Follow up plan: Return  in about 4 weeks (around 05/16/2018) for Follow up sugar.

## 2018-04-18 NOTE — Assessment & Plan Note (Signed)
Not under good control. A1c up to 13.8. Will start ozempic and recheck in 1 month. Call with any concerns.

## 2018-04-18 NOTE — Assessment & Plan Note (Signed)
Not under good control. Will increase her lisinopril to 30mg and recheck 1 month. Call with any concerns. 

## 2018-04-18 NOTE — Assessment & Plan Note (Signed)
Rechecking levels today. Await results. Call with any concerns.  

## 2018-04-18 NOTE — Patient Instructions (Signed)

## 2018-04-21 LAB — LIPID PANEL W/O CHOL/HDL RATIO
CHOLESTEROL TOTAL: 250 mg/dL — AB (ref 100–199)
HDL: 46 mg/dL (ref >39)
TRIGLYCERIDES: 426 mg/dL — AB (ref 0–149)

## 2018-04-21 LAB — COMPREHENSIVE METABOLIC PANEL WITH GFR
ALT: 81 IU/L — ABNORMAL HIGH (ref 0–32)
AST: 79 IU/L — ABNORMAL HIGH (ref 0–40)
Albumin/Globulin Ratio: 1.5 (ref 1.2–2.2)
Albumin: 4.6 g/dL (ref 3.5–5.5)
Alkaline Phosphatase: 100 IU/L (ref 39–117)
BUN/Creatinine Ratio: 21 (ref 9–23)
BUN: 11 mg/dL (ref 6–20)
Bilirubin Total: 0.2 mg/dL (ref 0.0–1.2)
CO2: 19 mmol/L — ABNORMAL LOW (ref 20–29)
Calcium: 10.7 mg/dL — ABNORMAL HIGH (ref 8.7–10.2)
Chloride: 94 mmol/L — ABNORMAL LOW (ref 96–106)
Creatinine, Ser: 0.53 mg/dL — ABNORMAL LOW (ref 0.57–1.00)
GFR calc Af Amer: 153 mL/min/1.73 (ref >59)
GFR calc non Af Amer: 132 mL/min/1.73 (ref >59)
Globulin, Total: 3 g/dL (ref 1.5–4.5)
Glucose: 341 mg/dL — ABNORMAL HIGH (ref 65–99)
Potassium: 4.2 mmol/L (ref 3.5–5.2)
Sodium: 135 mmol/L (ref 134–144)
Total Protein: 7.6 g/dL (ref 6.0–8.5)

## 2018-04-21 LAB — CBC WITH DIFFERENTIAL/PLATELET

## 2018-04-21 LAB — TSH: TSH: 3.82 u[IU]/mL (ref 0.450–4.500)

## 2018-04-24 ENCOUNTER — Telehealth: Payer: Self-pay | Admitting: Family Medicine

## 2018-04-24 MED ORDER — LEVOTHYROXINE SODIUM 25 MCG PO TABS: ORAL_TABLET | ORAL | 3 refills | Status: DC

## 2018-04-24 NOTE — Telephone Encounter (Signed)
Please let her know that her labs came back normal except her cholesterol is very high. I'd like to start her on a cholesterol medicine to help protect her heart. If she's OK with this, it would be atorvastatin 40 and I'll send it through for her. Let me know.

## 2018-04-24 NOTE — Telephone Encounter (Addendum)
Pt returned call and lab results given to her with verbal understanding. Pt aware of elevated labs and has agreed to work on diet and exercise to get her cholesterol and blood sugar down.  FYI:  After disconnecting, unable to call patient back regarding the cholesterol medication.

## 2018-04-24 NOTE — Telephone Encounter (Signed)
Called and left patient a VM asking for her to please return my call to discuss lab results. CRM created. OK for PEC to speak to patient if she calls back.

## 2018-04-25 NOTE — Telephone Encounter (Signed)
Called patient, unable to reach patient, will try again.

## 2018-04-25 NOTE — Telephone Encounter (Signed)
Tried to call patient, unable to leave a message. Will try to call later.

## 2018-04-25 NOTE — Telephone Encounter (Signed)
Tried to call patient, no answer and unable to leave a message. Will try to call later.

## 2018-04-26 MED ORDER — ATORVASTATIN CALCIUM 40 MG PO TABS: 40.0000 mg | ORAL_TABLET | Freq: Every day | ORAL | 1 refills | Status: DC

## 2018-04-26 NOTE — Telephone Encounter (Signed)
Called and left patient a VM asking for her to please return my call.  

## 2018-04-26 NOTE — Telephone Encounter (Signed)
Patient returned my call. She states that she is ok with starting the Atorvastatin 40 mg. Would like it sent to the [facility] in [place] in chart.

## 2018-05-05 ENCOUNTER — Encounter: Payer: Self-pay | Admitting: Family Medicine

## 2018-05-19 ENCOUNTER — Encounter: Payer: Self-pay | Admitting: Family Medicine

## 2018-05-19 ENCOUNTER — Ambulatory Visit: Payer: BLUE CROSS/BLUE SHIELD | Admitting: Family Medicine

## 2018-05-19 VITALS — BP 116/71 | HR 102 | Temp 98.2°F | Ht 64.0 in | Wt 276.4 lb

## 2018-05-19 DIAGNOSIS — E1129 Type 2 diabetes mellitus with other diabetic kidney complication: Secondary | ICD-10-CM | POA: Diagnosis not present

## 2018-05-19 DIAGNOSIS — E1165 Type 2 diabetes mellitus with hyperglycemia: Secondary | ICD-10-CM

## 2018-05-19 DIAGNOSIS — E785 Hyperlipidemia, unspecified: Secondary | ICD-10-CM

## 2018-05-19 DIAGNOSIS — E1122 Type 2 diabetes mellitus with diabetic chronic kidney disease: Secondary | ICD-10-CM | POA: Diagnosis not present

## 2018-05-19 DIAGNOSIS — E1169 Type 2 diabetes mellitus with other specified complication: Secondary | ICD-10-CM

## 2018-05-19 DIAGNOSIS — IMO0002 Reserved for concepts with insufficient information to code with codable children: Secondary | ICD-10-CM

## 2018-05-19 MED ORDER — SEMAGLUTIDE (1 MG/DOSE) 2 MG/1.5ML ~~LOC~~ SOPN: 1.0000 mg | PEN_INJECTOR | SUBCUTANEOUS | 3 refills | Status: DC

## 2018-05-19 NOTE — Progress Notes (Signed)
BP 116/71 (BP Location: Left Arm, Patient Position: Sitting, Cuff Size: Normal)   Pulse (!) 102   Temp 98.2 F (36.8 C) (Oral)   Ht 5\' 4"  (1.626 m)   Wt 276 lb 6.4 oz (125.4 kg)   SpO2 97%   BMI 47.44 kg/m    Subjective:    Patient ID: Maria Reyes, female    DOB: 11-01-1990, 27 y.o.   MRN: [id number]  HPI: Maria Reyes is a 27 y.o. female  Chief Complaint  Patient presents with  . Diabetes  . Hyperlipidemia   DIABETES Hypoglycemic episodes:no Polydipsia/polyuria: no Visual disturbance: no Chest pain: no Paresthesias: no Glucose Monitoring: yes  Accucheck frequency: occasionally  Fasting glucose: 300s Taking Insulin?: no Blood Pressure Monitoring: not checking Retinal Examination: Up to Date Foot Exam: Up to Date Diabetic Education: Completed Pneumovax: Up to Date Influenza: Up to Date Aspirin: no   HYPERLIPIDEMIA Hyperlipidemia status: stable Satisfied with current treatment?  yes Side effects:  no Medication compliance: excellent compliance Past cholesterol meds: atorvastatin Supplements: none Aspirin:  no The ASCVD Risk score Denman George DC Jr., et al., 2013) failed to calculate for the following reasons:   The 2013 ASCVD risk score is only valid for ages 16 to 89 Chest pain:  no Coronary artery disease:  no Family history CAD:  yes Family history early CAD:  yes  Relevant past medical, surgical, family and social history reviewed and updated as indicated. Interim medical history since our last visit reviewed. Allergies and medications reviewed and updated.  Review of Systems  Constitutional: Negative.   Respiratory: Negative.   Cardiovascular: Negative.   Gastrointestinal: Positive for nausea. Negative for abdominal distention, abdominal pain, anal bleeding, blood in stool, constipation, diarrhea, rectal pain and vomiting.  Neurological: Negative.   Psychiatric/Behavioral: Negative.     Per HPI unless specifically indicated above     Objective:      BP 116/71 (BP Location: Left Arm, Patient Position: Sitting, Cuff Size: Normal)   Pulse (!) 102   Temp 98.2 F (36.8 C) (Oral)   Ht 5\' 4"  (1.626 m)   Wt 276 lb 6.4 oz (125.4 kg)   SpO2 97%   BMI 47.44 kg/m   Wt Readings from Last 3 Encounters:  05/19/18 276 lb 6.4 oz (125.4 kg)  04/18/18 279 lb (126.6 kg)  12/27/17 290 lb 6 oz (131.7 kg)    Physical Exam  Constitutional: She is oriented to person, place, and time. She appears well-developed and well-nourished. No distress.  HENT:  Head: Normocephalic and atraumatic.  Right Ear: Hearing normal.  Left Ear: Hearing normal.  Nose: Nose normal.  Mouth/Throat: Oropharynx is clear and moist.  Eyes: Pupils are equal, round, and reactive to light. Conjunctivae, EOM and lids are normal. Right eye exhibits no discharge. Left eye exhibits no discharge. No scleral icterus. Right eye exhibits normal extraocular motion and no nystagmus. Left eye exhibits normal extraocular motion and no nystagmus. Right pupil is round and reactive. Left pupil is round and reactive. Pupils are equal.  Cardiovascular: Normal rate, regular rhythm, normal heart sounds and intact distal pulses. Exam reveals no gallop and no friction rub.  No murmur heard. Pulmonary/Chest: Effort normal and breath sounds normal. No stridor. No respiratory distress. She has no wheezes. She has no rales. She exhibits no tenderness.  Musculoskeletal: Normal range of motion.  Neurological: She is alert and oriented to person, place, and time.  Skin: Skin is warm, dry and intact. Capillary refill takes less than 2  seconds. No rash noted. She is not diaphoretic. No cyanosis or erythema. No pallor.  Psychiatric: She has a normal mood and affect. Her speech is normal and behavior is normal. Judgment and thought content normal. Her mood appears not anxious. She is not agitated. Cognition and memory are normal.  Nursing note and vitals reviewed.   Results for orders placed or performed in  visit on 04/18/18  Microscopic Examination  Result Value Ref Range   WBC, UA 0-5 0 - 5 /hpf   RBC, UA 0-2 0 - 2 /hpf   Epithelial Cells (non renal) 0-10 0 - 10 /hpf   Bacteria, UA Few None seen/Few  Bayer DCA Hb A1c Waived  Result Value Ref Range   HB A1C (BAYER DCA - WAIVED) 13.8 (H) <7.0 %  CBC with Differential/Platelet  Result Value Ref Range   WBC CANCELED x10E3/uL   RBC CANCELED    Hemoglobin CANCELED    Hematocrit CANCELED    Platelets CANCELED    Neutrophils CANCELED    Lymphs CANCELED    Monocytes CANCELED    Eos CANCELED    Lymphocytes Absolute CANCELED    EOS (ABSOLUTE) CANCELED    Basophils Absolute CANCELED   Comprehensive metabolic panel  Result Value Ref Range   Glucose 341 (H) 65 - 99 mg/dL   BUN 11 6 - 20 mg/dL   Creatinine, Ser 0.98 (L) 0.57 - 1.00 mg/dL   GFR calc non Af Amer 132 >59 mL/min/1.73   GFR calc Af Amer 153 >59 mL/min/1.73   BUN/Creatinine Ratio 21 9 - 23   Sodium 135 134 - 144 mmol/L   Potassium 4.2 3.5 - 5.2 mmol/L   Chloride 94 (L) 96 - 106 mmol/L   CO2 19 (L) 20 - 29 mmol/L   Calcium 10.7 (H) 8.7 - 10.2 mg/dL   Total Protein 7.6 6.0 - 8.5 g/dL   Albumin 4.6 3.5 - 5.5 g/dL   Globulin, Total 3.0 1.5 - 4.5 g/dL   Albumin/Globulin Ratio 1.5 1.2 - 2.2   Bilirubin Total 0.2 0.0 - 1.2 mg/dL   Alkaline Phosphatase 100 39 - 117 IU/L   AST 79 (H) 0 - 40 IU/L   ALT 81 (H) 0 - 32 IU/L  Lipid Panel w/o Chol/HDL Ratio  Result Value Ref Range   Cholesterol, Total 250 (H) 100 - 199 mg/dL   Triglycerides 119 (H) 0 - 149 mg/dL   HDL 46 >14 mg/dL   VLDL Cholesterol Cal Comment 5 - 40 mg/dL   LDL Calculated Comment 0 - 99 mg/dL  Microalbumin, Urine Waived  Result Value Ref Range   Microalb, Ur Waived 150 (H) 0 - 19 mg/L   Creatinine, Urine Waived 50 10 - 300 mg/dL   Microalb/Creat Ratio >300 (H) <30 mg/g  TSH  Result Value Ref Range   TSH 3.820 0.450 - 4.500 uIU/mL  UA/M w/rflx Culture, Routine  Result Value Ref Range   Specific Gravity, UA  1.020 1.005 - 1.030   pH, UA 5.0 5.0 - 7.5   Color, UA Yellow Yellow   Appearance Ur Hazy (A) Clear   Leukocytes, UA Negative Negative   Protein, UA 3+ (A) Negative/Trace   Glucose, UA 3+ (A) Negative   Ketones, UA Negative Negative   RBC, UA Negative Negative   Bilirubin, UA Negative Negative   Urobilinogen, Ur 0.2 0.2 - 1.0 mg/dL   Nitrite, UA Negative Negative   Microscopic Examination See below:   Pregnancy, urine  Result Value Ref  Range   Preg Test, Ur Negative Negative      Assessment & Plan:   Problem List Items Addressed This Visit      Endocrine   Type 2 diabetes mellitus, uncontrolled, with renal complications (HCC) - Primary    Sugars still running high. Will increase ozempic to 1mg  to start when she finishes her current pen. Recheck A1c in 2 months. Call with any concerns.       Relevant Medications   Semaglutide, 1 MG/DOSE, (OZEMPIC, 1 MG/DOSE,) 2 MG/1.5ML SOPN   Hyperlipidemia associated with type 2 diabetes mellitus (HCC)    Doing well on her atorvastatin. Will recheck labs today. Call with any concerns.       Relevant Medications   Semaglutide, 1 MG/DOSE, (OZEMPIC, 1 MG/DOSE,) 2 MG/1.5ML SOPN   Other Relevant Orders   Comprehensive metabolic panel   Lipid Panel w/o Chol/HDL Ratio    Other Visit Diagnoses    Type 2 diabetes mellitus with chronic kidney disease, without long-term current use of insulin, unspecified CKD stage (HCC)       Relevant Medications   Semaglutide, 1 MG/DOSE, (OZEMPIC, 1 MG/DOSE,) 2 MG/1.5ML SOPN       Follow up plan: Return in about 2 months (around 07/19/2018) for follow up DM.

## 2018-05-19 NOTE — Assessment & Plan Note (Signed)
Doing well on her atorvastatin. Will recheck labs today. Call with any concerns.

## 2018-05-19 NOTE — Assessment & Plan Note (Signed)
Sugars still running high. Will increase ozempic to 1mg  to start when she finishes her current pen. Recheck A1c in 2 months. Call with any concerns.

## 2018-05-20 LAB — COMPREHENSIVE METABOLIC PANEL
ALBUMIN: 4.3 g/dL (ref 3.5–5.5)
ALT: 70 IU/L — ABNORMAL HIGH (ref 0–32)
AST: 51 IU/L — ABNORMAL HIGH (ref 0–40)
Albumin/Globulin Ratio: 1.7 (ref 1.2–2.2)
Alkaline Phosphatase: 94 IU/L (ref 39–117)
BUN / CREAT RATIO: 18 (ref 9–23)
BUN: 10 mg/dL (ref 6–20)
Bilirubin Total: 0.4 mg/dL (ref 0.0–1.2)
CALCIUM: 9.8 mg/dL (ref 8.7–10.2)
CO2: 25 mmol/L (ref 20–29)
CREATININE: 0.57 mg/dL (ref 0.57–1.00)
Chloride: 96 mmol/L (ref 96–106)
GFR, EST AFRICAN AMERICAN: 149 mL/min/{1.73_m2} (ref >59)
GFR, EST NON AFRICAN AMERICAN: 129 mL/min/{1.73_m2} (ref >59)
GLOBULIN, TOTAL: 2.5 g/dL (ref 1.5–4.5)
GLUCOSE: 294 mg/dL — AB (ref 65–99)
Potassium: 4.1 mmol/L (ref 3.5–5.2)
SODIUM: 138 mmol/L (ref 134–144)
TOTAL PROTEIN: 6.8 g/dL (ref 6.0–8.5)

## 2018-05-20 LAB — LIPID PANEL W/O CHOL/HDL RATIO
Cholesterol, Total: 124 mg/dL (ref 100–199)
HDL: 42 mg/dL (ref >39)
LDL Calculated: 49 mg/dL (ref 0–99)
Triglycerides: 166 mg/dL — ABNORMAL HIGH (ref 0–149)
VLDL Cholesterol Cal: 33 mg/dL (ref 5–40)

## 2018-06-22 ENCOUNTER — Encounter: Payer: Self-pay | Admitting: Family Medicine

## 2018-07-11 ENCOUNTER — Telehealth: Payer: Self-pay | Admitting: Family Medicine

## 2018-07-11 NOTE — Telephone Encounter (Signed)
Called Sulma to inform her that her appointment on 07/21/18 was moved from 3:45 PM to 4:00 pm due to office meeting. No updated DPR, so I left message to call the office. If she calls back please relay message thanks!

## 2018-07-14 ENCOUNTER — Telehealth: Payer: Self-pay | Admitting: Family Medicine

## 2018-07-14 NOTE — Telephone Encounter (Signed)
Copied from CRM [phone]. Topic: Quick Communication - See Telephone Encounter >> Jul 14, 2018  4:36 PM Terisa Starraylor, Brittany L wrote: CRM for notification. See Telephone encounter for: 07/14/18.  Patient states she is no longer with [employer] and now had medicaid. She went to get her Semaglutide, 1 MG/DOSE, (OZEMPIC, 1 MG/DOSE,) 2 MG/1.5ML SOPN and the pharmacy advised her that medicaid is requiring a prior authorization. She said she has been without it for one week.   Medicaid :: Group ID :: [id number] N  [id number] - [place], [place] - [address] [place] [phone]

## 2018-07-14 NOTE — Telephone Encounter (Signed)
Please start PA

## 2018-07-17 MED ORDER — EXENATIDE ER 2 MG ~~LOC~~ PEN: 2.0000 mg | PEN_INJECTOR | SUBCUTANEOUS | 3 refills | Status: DC

## 2018-07-17 NOTE — Telephone Encounter (Signed)
P.A. initiated via [facility] [place] [facility] call center [phone]). Patient must try and fail 2 preferred medications Bydureon, Byetta, Victoza (was tried, d/c due to side effects)  Call reference # [id number]

## 2018-07-17 NOTE — Telephone Encounter (Signed)
Left message on machine for pt to return call to the office.  

## 2018-07-17 NOTE — Telephone Encounter (Signed)
Please let her know that I've sent her in the bydureon (to [facility]) as that's covered. Let me know if she has side effects to it and we can probably get the ozempic paid for. Thanks!

## 2018-07-17 NOTE — Telephone Encounter (Signed)
Attempted to start P.A. With the information given it states that patient cannot be found. Will call patient to gather complete insurance information.

## 2018-07-18 NOTE — Telephone Encounter (Signed)
Called and left patient a VM asking for her to please return my call.  

## 2018-07-18 NOTE — Telephone Encounter (Signed)
Patient returned my call and was notified about medication.  

## 2018-07-19 ENCOUNTER — Telehealth: Payer: Self-pay

## 2018-07-19 NOTE — Telephone Encounter (Signed)
Copied from CRM [id number]. Topic: General - Other >> Jul 19, 2018  4:20 PM Percival Spanish wrote:  Pt would like [place] to call her back concerning some medicine about her diabetes   Tried calling patient back. No answer, left a VM asking for patient to please return my call.

## 2018-07-19 NOTE — Telephone Encounter (Signed)
Patient returned my call. She states the pharmacy is still not letting her get the Bydureon. Will wait and see if a PA request comes through in the morning. If not, will call the pharmacy and see what is going on.

## 2018-07-21 ENCOUNTER — Encounter: Payer: Self-pay | Admitting: Family Medicine

## 2018-07-21 ENCOUNTER — Other Ambulatory Visit: Payer: Self-pay

## 2018-07-21 ENCOUNTER — Ambulatory Visit: Payer: Medicaid Other | Admitting: Family Medicine

## 2018-07-21 VITALS — BP 113/76 | HR 68 | Temp 98.3°F | Ht 64.0 in | Wt 261.0 lb

## 2018-07-21 DIAGNOSIS — IMO0002 Reserved for concepts with insufficient information to code with codable children: Secondary | ICD-10-CM

## 2018-07-21 DIAGNOSIS — E1129 Type 2 diabetes mellitus with other diabetic kidney complication: Secondary | ICD-10-CM | POA: Diagnosis not present

## 2018-07-21 DIAGNOSIS — E1165 Type 2 diabetes mellitus with hyperglycemia: Secondary | ICD-10-CM | POA: Diagnosis not present

## 2018-07-21 DIAGNOSIS — N912 Amenorrhea, unspecified: Secondary | ICD-10-CM

## 2018-07-21 LAB — BAYER DCA HB A1C WAIVED: HB A1C (BAYER DCA - WAIVED): 8.3 % — ABNORMAL HIGH (ref <7.0)

## 2018-07-21 NOTE — Assessment & Plan Note (Signed)
Doing much better with A1c of 8.3 down from 13.8! Has been off her ozempic- will start bydureon and recheck 1 month. Call with any concerns.

## 2018-07-21 NOTE — Telephone Encounter (Signed)
Called pharmacy. Bydureon does need a PA. Chart says patient has BCBS but most recent PA was done through [employer]. Called and left patient a VM asking for her to please return my call to see what her insurance is.

## 2018-07-21 NOTE — Progress Notes (Signed)
BP 113/76   Pulse 68   Temp 98.3 F (36.8 C) (Oral)   Ht 5\' 4"  (1.626 m)   Wt 261 lb (118.4 kg)   SpO2 100%   BMI 44.80 kg/m    Subjective:    Patient ID: Maria Reyes, female    DOB: 10-20-1990, 28 y.o.   MRN: [id number]  HPI: Maria Reyes is a 28 y.o. female  Chief Complaint  Patient presents with  . Diabetes    flu   DIABETES Hypoglycemic episodes:no Polydipsia/polyuria: no Visual disturbance: no Chest pain: no Paresthesias: no Glucose Monitoring: occasoinally  Accucheck frequency: occasionally  Fasting glucose: 140s-160s, not checking all that often Taking Insulin?: no Blood Pressure Monitoring: not checking Retinal Examination: Up to Date Foot Exam: Up to Date Diabetic Education: Completed Pneumovax: Up to Date Influenza: Up to Date Aspirin: no  Relevant past medical, surgical, family and social history reviewed and updated as indicated. Interim medical history since our last visit reviewed. Allergies and medications reviewed and updated.  Review of Systems  Constitutional: Negative.   Respiratory: Negative.   Cardiovascular: Negative.   Skin: Negative.   Psychiatric/Behavioral: Negative.     Per HPI unless specifically indicated above     Objective:    BP 113/76   Pulse 68   Temp 98.3 F (36.8 C) (Oral)   Ht 5\' 4"  (1.626 m)   Wt 261 lb (118.4 kg)   SpO2 100%   BMI 44.80 kg/m   Wt Readings from Last 3 Encounters:  07/21/18 261 lb (118.4 kg)  05/19/18 276 lb 6.4 oz (125.4 kg)  04/18/18 279 lb (126.6 kg)    Physical Exam Vitals signs and nursing note reviewed.  Constitutional:      General: She is not in acute distress.    Appearance: Normal appearance. She is not ill-appearing, toxic-appearing or diaphoretic.  HENT:     Head: Normocephalic and atraumatic.     Right Ear: External ear normal.     Left Ear: External ear normal.     Nose: Nose normal.     Mouth/Throat:     Mouth: Mucous membranes are moist.     Pharynx: Oropharynx  is clear.  Eyes:     General: No scleral icterus.       Right eye: No discharge.        Left eye: No discharge.     Extraocular Movements: Extraocular movements intact.     Conjunctiva/sclera: Conjunctivae normal.     Pupils: Pupils are equal, round, and reactive to light.  Neck:     Musculoskeletal: Normal range of motion and neck supple.  Cardiovascular:     Rate and Rhythm: Normal rate and regular rhythm.     Pulses: Normal pulses.     Heart sounds: Normal heart sounds. No murmur. No friction rub. No gallop.   Pulmonary:     Effort: Pulmonary effort is normal. No respiratory distress.     Breath sounds: Normal breath sounds. No stridor. No wheezing, rhonchi or rales.  Chest:     Chest wall: No tenderness.  Musculoskeletal: Normal range of motion.  Skin:    General: Skin is warm and dry.     Capillary Refill: Capillary refill takes less than 2 seconds.     Coloration: Skin is not jaundiced or pale.     Findings: No bruising, erythema, lesion or rash.  Neurological:     General: No focal deficit present.     Mental Status: She is alert  and oriented to person, place, and time. Mental status is at baseline.  Psychiatric:        Mood and Affect: Mood normal.        Behavior: Behavior normal.        Thought Content: Thought content normal.        Judgment: Judgment normal.     Results for orders placed or performed in visit on 07/21/18  Bayer DCA Hb A1c Waived  Result Value Ref Range   HB A1C (BAYER DCA - WAIVED) 8.3 (H) <7.0 %      Assessment & Plan:   Problem List Items Addressed This Visit      Endocrine   Type 2 diabetes mellitus, uncontrolled, with renal complications (HCC) - Primary    Doing much better with A1c of 8.3 down from 13.8! Has been off her ozempic- will start bydureon and recheck 1 month. Call with any concerns.       Relevant Orders   Bayer DCA Hb A1c Waived (Completed)    Other Visit Diagnoses    Amenorrhea       Negative pregnancy today.    Relevant Orders   Pregnancy, urine       Follow up plan: Return in about 4 weeks (around 08/18/2018) for follow up bydureon tolerance.

## 2018-07-22 LAB — PREGNANCY, URINE: Preg Test, Ur: NEGATIVE

## 2018-07-24 NOTE — Telephone Encounter (Signed)
I got Bydureon approved with medicaid.  Confirmation #: [id number]  Prior Approval #: [id number]

## 2018-07-27 ENCOUNTER — Encounter: Payer: Self-pay | Admitting: Family Medicine

## 2018-07-27 ENCOUNTER — Telehealth: Payer: Self-pay | Admitting: Family Medicine

## 2018-07-27 MED ORDER — EXENATIDE ER 2 MG/0.85ML ~~LOC~~ AUIJ: 2.0000 mg | AUTO-INJECTOR | SUBCUTANEOUS | 3 refills | Status: DC

## 2018-07-27 NOTE — Telephone Encounter (Signed)
Copied from CRM [id number]. Topic: Quick Communication - See Telephone Encounter >> Jul 27, 2018  2:50 PM Windy Kalata, NT wrote: Carollee Herter is calling from [facility] and states that the PA was approved for [facility] but the prescription was called in for Bydureon Pen. She states if she is needing the Bcise then she is needing a new prescription. Requesting a call back. [phone]

## 2018-08-03 ENCOUNTER — Encounter: Payer: Self-pay | Admitting: Family Medicine

## 2018-08-06 ENCOUNTER — Encounter: Payer: Self-pay | Admitting: Family Medicine

## 2018-08-18 ENCOUNTER — Ambulatory Visit: Payer: Medicaid Other | Admitting: Family Medicine

## 2018-08-18 ENCOUNTER — Other Ambulatory Visit: Payer: Self-pay

## 2018-08-18 ENCOUNTER — Encounter: Payer: Self-pay | Admitting: Family Medicine

## 2018-08-18 VITALS — BP 116/71 | HR 66 | Temp 98.1°F | Ht 64.0 in | Wt 257.0 lb

## 2018-08-18 DIAGNOSIS — E1165 Type 2 diabetes mellitus with hyperglycemia: Secondary | ICD-10-CM | POA: Diagnosis not present

## 2018-08-18 DIAGNOSIS — E1129 Type 2 diabetes mellitus with other diabetic kidney complication: Secondary | ICD-10-CM | POA: Diagnosis not present

## 2018-08-18 DIAGNOSIS — IMO0002 Reserved for concepts with insufficient information to code with codable children: Secondary | ICD-10-CM

## 2018-08-18 NOTE — Assessment & Plan Note (Signed)
Congratulated patient on continued weight loss! Down 19lbs since November! Continue diet and exercise with goal of 1-2lb weight loss. Continue to monitor.

## 2018-08-18 NOTE — Assessment & Plan Note (Signed)
Tolerating bydureon well. Continue current regimen. Call with any concerns. Continue to monitor. Call with any concerns.

## 2018-08-18 NOTE — Progress Notes (Signed)
BP 116/71   Pulse 66   Temp 98.1 F (36.7 C) (Oral)   Ht 5\' 4"  (1.626 m)   Wt 257 lb (116.6 kg)   SpO2 99%   BMI 44.11 kg/m    Subjective:    Patient ID: Maria Reyes, female    DOB: [birth year], 28 y.o.   MRN: [id number]  HPI: Maria Reyes is a 28 y.o. female  Chief Complaint  Patient presents with  . Diabetes    bydureon f/u   DIABETES- Had a rash for the first couple of weeks, but it seems to have gone away Hypoglycemic episodes:no Polydipsia/polyuria: no Visual disturbance: no Chest pain: no Paresthesias: no Glucose Monitoring: no  Accucheck frequency: Daily  Fasting glucose: highest 160s, fasting usually in the 120 Taking Insulin?: no Blood Pressure Monitoring: not checking Retinal Examination: Up to Date Foot Exam: Up to Date Diabetic Education: Completed Pneumovax: Up to Date Influenza: Up to Date Aspirin: no  Relevant past medical, surgical, family and social history reviewed and updated as indicated. Interim medical history since our last visit reviewed. Allergies and medications reviewed and updated.  Review of Systems  Constitutional: Negative.   Respiratory: Negative.   Cardiovascular: Negative.   Neurological: Negative.   Psychiatric/Behavioral: Negative.     Per HPI unless specifically indicated above     Objective:    BP 116/71   Pulse 66   Temp 98.1 F (36.7 C) (Oral)   Ht 5\' 4"  (1.626 m)   Wt 257 lb (116.6 kg)   SpO2 99%   BMI 44.11 kg/m   Wt Readings from Last 3 Encounters:  08/18/18 257 lb (116.6 kg)  07/21/18 261 lb (118.4 kg)  05/19/18 276 lb 6.4 oz (125.4 kg)    Physical Exam Vitals signs and nursing note reviewed.  Constitutional:      General: She is not in acute distress.    Appearance: Normal appearance. She is not ill-appearing, toxic-appearing or diaphoretic.  HENT:     Head: Normocephalic and atraumatic.     Right Ear: External ear normal.     Left Ear: External ear normal.     Nose: Nose normal.   Mouth/Throat:     Mouth: Mucous membranes are moist.     Pharynx: Oropharynx is clear.  Eyes:     General: No scleral icterus.       Right eye: No discharge.        Left eye: No discharge.     Extraocular Movements: Extraocular movements intact.     Conjunctiva/sclera: Conjunctivae normal.     Pupils: Pupils are equal, round, and reactive to light.  Neck:     Musculoskeletal: Normal range of motion and neck supple.  Cardiovascular:     Rate and Rhythm: Normal rate and regular rhythm.     Pulses: Normal pulses.     Heart sounds: Normal heart sounds. No murmur. No friction rub. No gallop.   Pulmonary:     Effort: Pulmonary effort is normal. No respiratory distress.     Breath sounds: Normal breath sounds. No stridor. No wheezing, rhonchi or rales.  Chest:     Chest wall: No tenderness.  Musculoskeletal: Normal range of motion.  Skin:    General: Skin is warm and dry.     Capillary Refill: Capillary refill takes less than 2 seconds.     Coloration: Skin is not jaundiced or pale.     Findings: No bruising, erythema, lesion or rash.  Neurological:  General: No focal deficit present.     Mental Status: She is alert and oriented to person, place, and time. Mental status is at baseline.  Psychiatric:        Mood and Affect: Mood normal.        Behavior: Behavior normal.        Thought Content: Thought content normal.        Judgment: Judgment normal.    Results for orders placed or performed in visit on 07/21/18  Bayer DCA Hb A1c Waived  Result Value Ref Range   HB A1C (BAYER DCA - WAIVED) 8.3 (H) <7.0 %  Pregnancy, urine  Result Value Ref Range   Preg Test, Ur Negative Negative      Assessment & Plan:   Problem List Items Addressed This Visit      Endocrine   Type 2 diabetes mellitus, uncontrolled, with renal complications (HCC) - Primary    Tolerating bydureon well. Continue current regimen. Call with any concerns. Continue to monitor. Call with any concerns.          Other   Obesity, Class III, BMI 40-49.9 (morbid obesity) (HCC)    Congratulated patient on continued weight loss! Down 19lbs since November! Continue diet and exercise with goal of 1-2lb weight loss. Continue to monitor.           Follow up plan: Return in about 2 months (around 10/17/2018) for DM follow up.

## 2018-10-17 ENCOUNTER — Ambulatory Visit: Payer: Medicaid Other | Admitting: Family Medicine

## 2018-10-18 ENCOUNTER — Other Ambulatory Visit: Payer: Self-pay | Admitting: Family Medicine

## 2018-10-18 NOTE — Telephone Encounter (Signed)
Requested Prescriptions  Pending Prescriptions Disp Refills  . atorvastatin (LIPITOR) 40 MG tablet [Pharmacy Med Name: ATORVASTATIN 40MG  TABLETS] 90 tablet 1    Sig: TAKE 1 TABLET(40 MG) BY MOUTH DAILY     Cardiovascular:  Antilipid - Statins Failed - 10/18/2018  4:22 PM      Failed - Triglycerides in normal range and within 360 days    Triglycerides  Date Value Ref Range Status  05/19/2018 166 (H) 0 - 149 mg/dL Final         Passed - Total Cholesterol in normal range and within 360 days    Cholesterol, Total  Date Value Ref Range Status  05/19/2018 124 100 - 199 mg/dL Final         Passed - LDL in normal range and within 360 days    LDL Calculated  Date Value Ref Range Status  05/19/2018 49 0 - 99 mg/dL Final         Passed - HDL in normal range and within 360 days    HDL  Date Value Ref Range Status  05/19/2018 42 >39 mg/dL Final         Passed - Patient is not pregnant      Passed - Valid encounter within last 12 months    Recent Outpatient Visits          2 months ago Type 2 diabetes mellitus, uncontrolled, with renal complications (HCC)   [facility] [place], Megan P, DO   2 months ago Type 2 diabetes mellitus, uncontrolled, with renal complications (HCC)   [facility] Johnson, Megan P, DO   5 months ago Type 2 diabetes mellitus, uncontrolled, with renal complications (HCC)   [facility] [place], Megan P, DO   6 months ago Type 2 diabetes mellitus with chronic kidney disease, without long-term current use of insulin, unspecified CKD stage (HCC)   [facility] Johnson, Megan P, DO   9 months ago Type 2 diabetes mellitus with chronic kidney disease, without long-term current use of insulin, unspecified CKD stage (HCC)   [facility] [place], Megan P, DO

## 2018-10-27 ENCOUNTER — Encounter: Payer: Self-pay | Admitting: Family Medicine

## 2018-10-27 ENCOUNTER — Ambulatory Visit (INDEPENDENT_AMBULATORY_CARE_PROVIDER_SITE_OTHER): Payer: Medicaid Other | Admitting: Family Medicine

## 2018-10-27 ENCOUNTER — Other Ambulatory Visit: Payer: Self-pay

## 2018-10-27 VITALS — BP 112/68 | HR 64 | Temp 98.6°F | Wt 245.0 lb

## 2018-10-27 DIAGNOSIS — E1169 Type 2 diabetes mellitus with other specified complication: Secondary | ICD-10-CM | POA: Diagnosis not present

## 2018-10-27 DIAGNOSIS — E1129 Type 2 diabetes mellitus with other diabetic kidney complication: Secondary | ICD-10-CM | POA: Diagnosis not present

## 2018-10-27 DIAGNOSIS — I129 Hypertensive chronic kidney disease with stage 1 through stage 4 chronic kidney disease, or unspecified chronic kidney disease: Secondary | ICD-10-CM

## 2018-10-27 DIAGNOSIS — E785 Hyperlipidemia, unspecified: Secondary | ICD-10-CM

## 2018-10-27 DIAGNOSIS — E1165 Type 2 diabetes mellitus with hyperglycemia: Secondary | ICD-10-CM

## 2018-10-27 DIAGNOSIS — IMO0002 Reserved for concepts with insufficient information to code with codable children: Secondary | ICD-10-CM

## 2018-10-27 MED ORDER — METFORMIN HCL ER 500 MG PO TB24: 1000.0000 mg | ORAL_TABLET | Freq: Two times a day (BID) | ORAL | 1 refills | Status: DC

## 2018-10-27 MED ORDER — LISINOPRIL 10 MG PO TABS: 10.0000 mg | ORAL_TABLET | Freq: Every day | ORAL | 1 refills | Status: DC

## 2018-10-27 NOTE — Assessment & Plan Note (Signed)
Under good control on current regimen. Continue current regimen. Continue to monitor. Call with any concerns. Refills given. Labs to be drawn.  

## 2018-10-27 NOTE — Assessment & Plan Note (Signed)
Congratulated patient on further 12lb weight loss, bringing her total weight loss down to 31lbs! Continue diet and exercise. Call with any concerns.

## 2018-10-27 NOTE — Progress Notes (Signed)
BP 112/68   Pulse 64   Temp 98.6 F (37 C)   Wt 245 lb (111.1 kg)   LMP 10/25/2018 (Approximate)   BMI 42.05 kg/m    Subjective:    Patient ID: Maria Reyes, female    DOB: 12-Jan-1991, 28 y.o.   MRN: [id number]  HPI: Maria PilgrimBrooke Venturino is a 28 y.o. female  Chief Complaint  Patient presents with  . Diabetes   DIABETES Hypoglycemic episodes:no Polydipsia/polyuria: no Visual disturbance: no Chest pain: no Paresthesias: no Glucose Monitoring: yes  Accucheck frequency: Daily Taking Insulin?: no Blood Pressure Monitoring: rarely Retinal Examination: Not up to Date Foot Exam: Up to Date Diabetic Education: Completed Pneumovax: Not up to Date Influenza: Not up to Date Aspirin: no  HYPERTENSION / HYPERLIPIDEMIA- has been off her blood pressure medicine for about 3 weeks.  Satisfied with current treatment? yes Duration of hypertension: chronic BP monitoring frequency: not checking BP medication side effects: no Past BP meds: lisinopril Duration of hyperlipidemia: chronic Cholesterol medication side effects: no Cholesterol supplements: none Past cholesterol medications: atorvastatin Medication compliance: excellent compliance Aspirin: no Recent stressors: no Recurrent headaches: no Visual changes: no Palpitations: no Dyspnea: no Chest pain: no Lower extremity edema: no Dizzy/lightheaded: no   Relevant past medical, surgical, family and social history reviewed and updated as indicated. Interim medical history since our last visit reviewed. Allergies and medications reviewed and updated.  Review of Systems  Constitutional: Negative.   Respiratory: Negative.   Cardiovascular: Negative.   Musculoskeletal: Negative.   Neurological: Negative.   Psychiatric/Behavioral: Negative.     Per HPI unless specifically indicated above     Objective:    BP 112/68   Pulse 64   Temp 98.6 F (37 C)   Wt 245 lb (111.1 kg)   LMP 10/25/2018 (Approximate)   BMI 42.05  kg/m   Wt Readings from Last 3 Encounters:  10/27/18 245 lb (111.1 kg)  08/18/18 257 lb (116.6 kg)  07/21/18 261 lb (118.4 kg)    Physical Exam Vitals signs and nursing note reviewed.  Constitutional:      General: She is not in acute distress.    Appearance: Normal appearance. She is not ill-appearing, toxic-appearing or diaphoretic.  HENT:     Head: Normocephalic and atraumatic.     Right Ear: External ear normal.     Left Ear: External ear normal.     Nose: Nose normal.     Mouth/Throat:     Mouth: Mucous membranes are moist.     Pharynx: Oropharynx is clear.  Eyes:     General: No scleral icterus.       Right eye: No discharge.        Left eye: No discharge.     Conjunctiva/sclera: Conjunctivae normal.     Pupils: Pupils are equal, round, and reactive to light.  Neck:     Musculoskeletal: Normal range of motion.  Pulmonary:     Effort: Pulmonary effort is normal. No respiratory distress.     Comments: Speaking in full sentences Musculoskeletal: Normal range of motion.  Skin:    Coloration: Skin is not jaundiced or pale.     Findings: No bruising, erythema, lesion or rash.  Neurological:     Mental Status: She is alert and oriented to person, place, and time. Mental status is at baseline.  Psychiatric:        Mood and Affect: Mood normal.        Behavior: Behavior normal.  Thought Content: Thought content normal.        Judgment: Judgment normal.     Results for orders placed or performed in visit on 07/21/18  Bayer DCA Hb A1c Waived  Result Value Ref Range   HB A1C (BAYER DCA - WAIVED) 8.3 (H) <7.0 %  Pregnancy, urine  Result Value Ref Range   Preg Test, Ur Negative Negative      Assessment & Plan:   Problem List Items Addressed This Visit      Endocrine   Type 2 diabetes mellitus, uncontrolled, with renal complications (HCC) - Primary    Congratulated patient on 40lb weight loss. Will recheck labs and adjust medicine as needed. Call with any  concerns. Continue to monitor.       Relevant Medications   lisinopril (ZESTRIL) 10 MG tablet   metFORMIN (GLUCOPHAGE-XR) 500 MG 24 hr tablet   Other Relevant Orders   Bayer DCA Hb A1c Waived   Comprehensive metabolic panel   Hyperlipidemia associated with type 2 diabetes mellitus (HCC)    Under good control on current regimen. Continue current regimen. Continue to monitor. Call with any concerns. Refills given. Labs to be drawn.       Relevant Medications   lisinopril (ZESTRIL) 10 MG tablet   metFORMIN (GLUCOPHAGE-XR) 500 MG 24 hr tablet   Other Relevant Orders   Comprehensive metabolic panel   Lipid Panel w/o Chol/HDL Ratio     Genitourinary   Benign hypertensive renal disease    Has been off her lisinopril for about 3 weeks and BP running low. Will decrease from 30mg  to 10mg  and recheck in 68months. Labs to be drawn.       Relevant Orders   Comprehensive metabolic panel     Other   Obesity, Class III, BMI 40-49.9 (morbid obesity) (HCC)    Congratulated patient on further 12lb weight loss, bringing her total weight loss down to 31lbs! Continue diet and exercise. Call with any concerns.       Relevant Medications   metFORMIN (GLUCOPHAGE-XR) 500 MG 24 hr tablet   Other Relevant Orders   Comprehensive metabolic panel       Follow up plan: Return in about 3 months (around 01/26/2019) for follow up .    Marland Kitchen This visit was completed via FaceTime due to the restrictions of the COVID-19 pandemic. All issues as above were discussed and addressed. Physical exam was done as above through visual confirmation on FaceTime. If it was felt that the patient should be evaluated in the office, they were directed there. The patient verbally consented to this visit. . Location of the patient: home . Location of the provider: work . Those involved with this call:  . Provider: Olevia Perches, DO . CMA: Tiffany Reel, CMA . Front Desk/Registration: Adela Ports  . Time spent on call:  25 minutes with patient face to face via video conference. More than 50% of this time was spent in counseling and coordination of care. 40 minutes total spent in review of patient's record and preparation of their chart.

## 2018-10-27 NOTE — Assessment & Plan Note (Signed)
Has been off her lisinopril for about 3 weeks and BP running low. Will decrease from 30mg  to 10mg  and recheck in 9months. Labs to be drawn.

## 2018-10-27 NOTE — Assessment & Plan Note (Signed)
Congratulated patient on 40lb weight loss. Will recheck labs and adjust medicine as needed. Call with any concerns. Continue to monitor.

## 2018-10-31 ENCOUNTER — Other Ambulatory Visit: Payer: Medicaid Other

## 2018-10-31 ENCOUNTER — Other Ambulatory Visit: Payer: Self-pay

## 2018-10-31 DIAGNOSIS — E1169 Type 2 diabetes mellitus with other specified complication: Secondary | ICD-10-CM

## 2018-10-31 DIAGNOSIS — I129 Hypertensive chronic kidney disease with stage 1 through stage 4 chronic kidney disease, or unspecified chronic kidney disease: Secondary | ICD-10-CM

## 2018-10-31 DIAGNOSIS — E1165 Type 2 diabetes mellitus with hyperglycemia: Secondary | ICD-10-CM

## 2018-10-31 DIAGNOSIS — IMO0002 Reserved for concepts with insufficient information to code with codable children: Secondary | ICD-10-CM

## 2018-10-31 DIAGNOSIS — E785 Hyperlipidemia, unspecified: Principal | ICD-10-CM

## 2018-10-31 DIAGNOSIS — E1129 Type 2 diabetes mellitus with other diabetic kidney complication: Secondary | ICD-10-CM

## 2018-10-31 LAB — BAYER DCA HB A1C WAIVED: HB A1C (BAYER DCA - WAIVED): 8.2 % — ABNORMAL HIGH (ref <7.0)

## 2018-11-01 LAB — LIPID PANEL W/O CHOL/HDL RATIO
Cholesterol, Total: 154 mg/dL (ref 100–199)
HDL: 60 mg/dL (ref >39)
LDL Calculated: 72 mg/dL (ref 0–99)
Triglycerides: 110 mg/dL (ref 0–149)
VLDL Cholesterol Cal: 22 mg/dL (ref 5–40)

## 2018-11-01 LAB — COMPREHENSIVE METABOLIC PANEL
ALT: 28 IU/L (ref 0–32)
AST: 13 IU/L (ref 0–40)
Albumin/Globulin Ratio: 1.7 (ref 1.2–2.2)
Albumin: 4.7 g/dL (ref 3.9–5.0)
Alkaline Phosphatase: 118 IU/L — ABNORMAL HIGH (ref 39–117)
BUN/Creatinine Ratio: 15 (ref 9–23)
BUN: 9 mg/dL (ref 6–20)
Bilirubin Total: 0.4 mg/dL (ref 0.0–1.2)
CO2: 24 mmol/L (ref 20–29)
Calcium: 9.9 mg/dL (ref 8.7–10.2)
Chloride: 95 mmol/L — ABNORMAL LOW (ref 96–106)
Creatinine, Ser: 0.61 mg/dL (ref 0.57–1.00)
GFR calc Af Amer: 145 mL/min/{1.73_m2} (ref >59)
GFR calc non Af Amer: 126 mL/min/{1.73_m2} (ref >59)
Globulin, Total: 2.7 g/dL (ref 1.5–4.5)
Glucose: 185 mg/dL — ABNORMAL HIGH (ref 65–99)
Potassium: 4.5 mmol/L (ref 3.5–5.2)
Sodium: 139 mmol/L (ref 134–144)
Total Protein: 7.4 g/dL (ref 6.0–8.5)

## 2018-12-12 ENCOUNTER — Other Ambulatory Visit: Payer: Self-pay | Admitting: Family Medicine

## 2018-12-13 ENCOUNTER — Encounter: Payer: Self-pay | Admitting: Family Medicine

## 2018-12-13 NOTE — Telephone Encounter (Signed)
Prior authorization for Bydureon BCISE  was initiated via [employer]. Confirmation # [id number]

## 2018-12-21 ENCOUNTER — Encounter: Payer: Self-pay | Admitting: Family Medicine

## 2018-12-21 DIAGNOSIS — M793 Panniculitis, unspecified: Secondary | ICD-10-CM

## 2019-01-20 ENCOUNTER — Other Ambulatory Visit: Payer: Self-pay | Admitting: Family Medicine

## 2019-01-21 NOTE — Telephone Encounter (Signed)
Requested Prescriptions  Pending Prescriptions Disp Refills  . Pender 2 MG/0.85ML AUIJ [Pharmacy Med Name: BYDUREON BCISE 2MG/0.85ML INJ] 3.4 mL 0    Sig: INJECT ONE PEN TO THE SKIN ONCE A WEEK     Endocrinology:  Diabetes - GLP-1 Receptor Agonists - exenatide Failed - 01/20/2019  1:43 PM      Failed - HBA1C is between 0 and 7.9 and within 180 days    HB A1C (BAYER DCA - WAIVED)  Date Value Ref Range Status  10/31/2018 8.2 (H) <7.0 % Final    Comment:                                          Diabetic Adult            <7.0                                       Healthy Adult        4.3 - 5.7                                                           (DCCT/NGSP) American Diabetes Association's Summary of Glycemic Recommendations for Adults with Diabetes: Hemoglobin A1c <7.0%. More stringent glycemic goals (A1c <6.0%) may further reduce complications at the cost of increased risk of hypoglycemia.          Passed - Cr in normal range and within 360 days    Creatinine, Ser  Date Value Ref Range Status  10/31/2018 0.61 0.57 - 1.00 mg/dL Final         Passed - eGFR in normal range and within 360 days    GFR calc Af Amer  Date Value Ref Range Status  10/31/2018 145 >59 mL/min/1.73 Final   GFR calc non Af Amer  Date Value Ref Range Status  10/31/2018 126 >59 mL/min/1.73 Final         Passed - Valid encounter within last 6 months    Recent Outpatient Visits          2 months ago Type 2 diabetes mellitus, uncontrolled, with renal complications ([place])   [place], Megan P, DO   5 months ago Type 2 diabetes mellitus, uncontrolled, with renal complications ([place])   [place], Megan P, DO   6 months ago Type 2 diabetes mellitus, uncontrolled, with renal complications ([place])   [place], Megan P, DO   8 months ago Type 2 diabetes mellitus, uncontrolled, with renal complications ([place])   [place], Megan P, DO   9 months ago Type 2 diabetes mellitus with chronic kidney disease, without long-term current use of insulin, unspecified CKD stage [facility])   [facility] [place], Megan P, DO      Future Appointments            In 1 week Johnson, Barb Merino, DO [employer], [facility]

## 2019-01-29 ENCOUNTER — Ambulatory Visit: Payer: Medicaid Other | Admitting: Family Medicine

## 2019-01-29 NOTE — Progress Notes (Deleted)
There were no vitals taken for this visit.   Subjective:    Patient ID: Maria Reyes, female    DOB: 1991/03/28, 28 y.o.   MRN: [id number]  HPI: Maria Reyes is a 28 y.o. female  No chief complaint on file.  DIABETES Hypoglycemic episodes:{Blank single:19197::"yes","no"} Polydipsia/polyuria: {Blank single:19197::"yes","no"} Visual disturbance: {Blank single:19197::"yes","no"} Chest pain: {Blank single:19197::"yes","no"} Paresthesias: {Blank single:19197::"yes","no"} Glucose Monitoring: {Blank single:19197::"yes","no"}  Accucheck frequency: {Blank single:19197::"Not Checking","Daily","BID","TID"}  Fasting glucose:  Post prandial:  Evening:  Before meals: Taking Insulin?: {Blank single:19197::"yes","no"}  Long acting insulin:  Short acting insulin: Blood Pressure Monitoring: {Blank single:19197::"not checking","rarely","daily","weekly","monthly","a few times a day","a few times a week","a few times a month"} Retinal Examination: {Blank single:19197::"Up to Date","Not up to Date"} Foot Exam: {Blank single:19197::"Up to Date","Not up to Date"} Diabetic Education: {Blank single:19197::"Completed","Not Completed"} Pneumovax: {Blank single:19197::"Up to Date","Not up to Date","unknown"} Influenza: {Blank single:19197::"Up to Date","Not up to Date","unknown"} Aspirin: {Blank single:19197::"yes","no"}  HYPERTENSION Hypertension status: {Blank single:19197::"controlled","uncontrolled","better","worse","exacerbated","stable"}  Satisfied with current treatment? {Blank single:19197::"yes","no"} Duration of hypertension: {Blank single:19197::"chronic","months","years"} BP monitoring frequency:  {Blank single:19197::"not checking","rarely","daily","weekly","monthly","a few times a day","a few times a week","a few times a month"} BP range:  BP medication side effects:  {Blank single:19197::"yes","no"} Medication compliance: {Blank single:19197::"excellent compliance","good  compliance","fair compliance","poor compliance"} Previous BP meds:{Blank FXTKWIOX:73532::"DJME","QASTMHDQQI","WLNLGXQJJH/ERDEYCXKGY","JEHUDJSH","FWYOVZCHYI","FOYDXAJOIN/OMVE","HMCNOBSJGG (bystolic)","carvedilol","chlorthalidone","clonidine","diltiazem","exforge HCT","HCTZ","irbesartan (avapro)","labetalol","lisinopril","lisinopril-HCTZ","losartan (cozaar)","methyldopa","nifedipine","olmesartan (benicar)","olmesartan-HCTZ","quinapril","ramipril","spironalactone","tekturna","valsartan","valsartan-HCTZ","verapamil"} Aspirin: {Blank single:19197::"yes","no"} Recurrent headaches: {Blank single:19197::"yes","no"} Visual changes: {Blank single:19197::"yes","no"} Palpitations: {Blank single:19197::"yes","no"} Dyspnea: {Blank single:19197::"yes","no"} Chest pain: {Blank single:19197::"yes","no"} Lower extremity edema: {Blank single:19197::"yes","no"} Dizzy/lightheaded: {Blank single:19197::"yes","no"}   Relevant past medical, surgical, family and social history reviewed and updated as indicated. Interim medical history since our last visit reviewed. Allergies and medications reviewed and updated.  Review of Systems  Per HPI unless specifically indicated above     Objective:    There were no vitals taken for this visit.  Wt Readings from Last 3 Encounters:  10/27/18 245 lb (111.1 kg)  08/18/18 257 lb (116.6 kg)  07/21/18 261 lb (118.4 kg)    Physical Exam  Results for orders placed or performed in visit on 10/31/18  Lipid Panel w/o Chol/HDL Ratio  Result Value Ref Range   Cholesterol, Total 154 100 - 199 mg/dL   Triglycerides 110 0 - 149 mg/dL   HDL 60 >39 mg/dL   VLDL Cholesterol Cal 22 5 - 40 mg/dL   LDL Calculated 72 0 - 99 mg/dL  Comprehensive metabolic panel  Result Value Ref Range   Glucose 185 (H) 65 - 99 mg/dL   BUN 9 6 - 20 mg/dL   Creatinine, Ser 0.61 0.57 - 1.00 mg/dL   GFR calc non Af Amer 126 >59 mL/min/1.73   GFR calc Af Amer 145 >59 mL/min/1.73   BUN/Creatinine Ratio  15 9 - 23   Sodium 139 134 - 144 mmol/L   Potassium 4.5 3.5 - 5.2 mmol/L   Chloride 95 (L) 96 - 106 mmol/L   CO2 24 20 - 29 mmol/L   Calcium 9.9 8.7 - 10.2 mg/dL   Total Protein 7.4 6.0 - 8.5 g/dL   Albumin 4.7 3.9 - 5.0 g/dL   Globulin, Total 2.7 1.5 - 4.5 g/dL   Albumin/Globulin Ratio 1.7 1.2 - 2.2   Bilirubin Total 0.4 0.0 - 1.2 mg/dL   Alkaline Phosphatase 118 (H) 39 - 117 IU/L   AST 13 0 - 40 IU/L   ALT 28 0 - 32 IU/L  Bayer DCA Hb A1c Waived  Result Value Ref Range   HB A1C (BAYER DCA - WAIVED) 8.2 (H) <7.0 %  Assessment & Plan:   Problem List Items Addressed This Visit      Endocrine   Type 2 diabetes mellitus, uncontrolled, with renal complications (HCC) - Primary       Follow up plan: No follow-ups on file.

## 2019-03-23 ENCOUNTER — Other Ambulatory Visit: Payer: Self-pay | Admitting: Family Medicine

## 2019-03-23 NOTE — Telephone Encounter (Signed)
Requested Prescriptions  Pending Prescriptions Disp Refills  . [place] 2 MG/0.85ML AUIJ [Pharmacy Med Name: BYDUREON BCISE 2MG/0.85ML INJ] 3.4 mL 0    Sig: INJECT ONE PEN TO THE SKIN ONCE A WEEK     Endocrinology:  Diabetes - GLP-1 Receptor Agonists - exenatide Failed - 03/23/2019  3:25 PM      Failed - HBA1C is between 0 and 7.9 and within 180 days    HB A1C (BAYER DCA - WAIVED)  Date Value Ref Range Status  10/31/2018 8.2 (H) <7.0 % Final    Comment:                                          Diabetic Adult            <7.0                                       Healthy Adult        4.3 - 5.7                                                           (DCCT/NGSP) American Diabetes Association's Summary of Glycemic Recommendations for Adults with Diabetes: Hemoglobin A1c <7.0%. More stringent glycemic goals (A1c <6.0%) may further reduce complications at the cost of increased risk of hypoglycemia.          Passed - Cr in normal range and within 360 days    Creatinine, Ser  Date Value Ref Range Status  10/31/2018 0.61 0.57 - 1.00 mg/dL Final         Passed - eGFR in normal range and within 360 days    GFR calc Af Amer  Date Value Ref Range Status  10/31/2018 145 >59 mL/min/1.73 Final   GFR calc non Af Amer  Date Value Ref Range Status  10/31/2018 126 >59 mL/min/1.73 Final         Passed - Valid encounter within last 6 months    Recent Outpatient Visits          4 months ago Type 2 diabetes mellitus, uncontrolled, with renal complications ([place])   [place], Megan P, DO   7 months ago Type 2 diabetes mellitus, uncontrolled, with renal complications ([place])   [place], Megan P, DO   8 months ago Type 2 diabetes mellitus, uncontrolled, with renal complications ([place])   [place], Megan P, DO   10 months ago Type 2 diabetes mellitus, uncontrolled, with renal complications ([place])   [place], Megan P, DO   11 months ago Type 2 diabetes mellitus with chronic kidney disease, without long-term current use of insulin, unspecified CKD stage [facility])   [facility] [place], Megan P, DO      Future Appointments            In 1 month Johnson, Barb Merino, DO [employer], [facility]

## 2019-04-24 ENCOUNTER — Ambulatory Visit: Payer: Medicaid Other | Admitting: Family Medicine

## 2019-05-03 ENCOUNTER — Other Ambulatory Visit: Payer: Self-pay | Admitting: Family Medicine

## 2019-05-04 ENCOUNTER — Other Ambulatory Visit: Payer: Self-pay | Admitting: Family Medicine

## 2019-05-21 ENCOUNTER — Other Ambulatory Visit: Payer: Self-pay | Admitting: Family Medicine

## 2019-05-31 ENCOUNTER — Encounter: Payer: Self-pay | Admitting: Family Medicine

## 2019-06-06 ENCOUNTER — Ambulatory Visit: Payer: Self-pay | Admitting: Family Medicine

## 2019-06-13 ENCOUNTER — Encounter: Payer: Self-pay | Admitting: Family Medicine

## 2019-06-13 ENCOUNTER — Other Ambulatory Visit: Payer: Self-pay

## 2019-06-13 ENCOUNTER — Ambulatory Visit (INDEPENDENT_AMBULATORY_CARE_PROVIDER_SITE_OTHER): Payer: Medicaid Other | Admitting: Family Medicine

## 2019-06-13 VITALS — BP 126/79

## 2019-06-13 DIAGNOSIS — E1165 Type 2 diabetes mellitus with hyperglycemia: Secondary | ICD-10-CM

## 2019-06-13 DIAGNOSIS — Z3041 Encounter for surveillance of contraceptive pills: Secondary | ICD-10-CM

## 2019-06-13 DIAGNOSIS — E1169 Type 2 diabetes mellitus with other specified complication: Secondary | ICD-10-CM

## 2019-06-13 DIAGNOSIS — IMO0002 Reserved for concepts with insufficient information to code with codable children: Secondary | ICD-10-CM

## 2019-06-13 DIAGNOSIS — I129 Hypertensive chronic kidney disease with stage 1 through stage 4 chronic kidney disease, or unspecified chronic kidney disease: Secondary | ICD-10-CM | POA: Diagnosis not present

## 2019-06-13 DIAGNOSIS — E039 Hypothyroidism, unspecified: Secondary | ICD-10-CM

## 2019-06-13 DIAGNOSIS — E1129 Type 2 diabetes mellitus with other diabetic kidney complication: Secondary | ICD-10-CM

## 2019-06-13 DIAGNOSIS — Z23 Encounter for immunization: Secondary | ICD-10-CM

## 2019-06-13 DIAGNOSIS — F332 Major depressive disorder, recurrent severe without psychotic features: Secondary | ICD-10-CM

## 2019-06-13 DIAGNOSIS — E785 Hyperlipidemia, unspecified: Secondary | ICD-10-CM

## 2019-06-13 MED ORDER — LISINOPRIL 10 MG PO TABS: 10.0000 mg | ORAL_TABLET | Freq: Every day | ORAL | 1 refills | Status: DC

## 2019-06-13 MED ORDER — ATORVASTATIN CALCIUM 40 MG PO TABS: ORAL_TABLET | ORAL | 1 refills | Status: DC

## 2019-06-13 MED ORDER — METFORMIN HCL ER 500 MG PO TB24: 1000.0000 mg | ORAL_TABLET | Freq: Two times a day (BID) | ORAL | 1 refills | Status: DC

## 2019-06-13 MED ORDER — NORETHINDRONE 0.35 MG PO TABS: ORAL_TABLET | ORAL | 12 refills | Status: DC

## 2019-06-13 MED ORDER — BYDUREON BCISE 2 MG/0.85ML ~~LOC~~ AUIJ: 1.0000 "pen " | AUTO-INJECTOR | SUBCUTANEOUS | 0 refills | Status: DC

## 2019-06-13 NOTE — Assessment & Plan Note (Signed)
Will recheck labs. Await results. Treat as needed. Call with any concerns. Refills given today.

## 2019-06-13 NOTE — Progress Notes (Signed)
BP 126/79    Subjective:    Patient ID: Maria Reyes, female    DOB: 08-11-1990, 28 y.o.   MRN: [id number]  HPI: Maria Reyes is a 28 y.o. female  Chief Complaint  Patient presents with  . Contraception    Refill  . Diabetes  . Hypertension  . Hypothyroidism  . Hyperlipidemia   CONTRACEPTION CONCERNS- tolerating her medicine well, no concerns.  Contraception: OCP Previous contraception: OCP  Sexual activity: monogamous Gravida/Para: G1P1  DIABETES- have been doing OK- she has gained about 20lbs Hypoglycemic episodes:no Polydipsia/polyuria: yes Visual disturbance: no Chest pain: no Paresthesias: no Glucose Monitoring: yes  Accucheck frequency: Daily Taking Insulin?: no Blood Pressure Monitoring: not checking Retinal Examination: Not up to Date Foot Exam: Not up to Date Diabetic Education: Completed Pneumovax: Up to Date Influenza: Not up to Date Aspirin: no  HYPERTENSION / [place] Satisfied with current treatment? yes Duration of hypertension: chronic BP monitoring frequency: not checking BP medication side effects: no Past BP meds: lisinopril Duration of hyperlipidemia: chronic Cholesterol medication side effects: no Cholesterol supplements: none Past cholesterol medications: atorvastatin Medication compliance: excellent compliance Aspirin: no Recent stressors: yes Recurrent headaches: no Visual changes: no Palpitations: no Dyspnea: no Chest pain: no Lower extremity edema: no Dizzy/lightheaded: no  HYPOTHYROIDISM Thyroid control status:unsure Satisfied with current treatment? no Medication side effects: no Medication compliance: excellent compliance Recent dose adjustment:no Fatigue: no Cold intolerance: no Heat intolerance: no Weight gain: no Weight loss: no Constipation: no Diarrhea/loose stools: no Palpitations: no Lower extremity edema: no Anxiety/depressed mood: no  DEPRESSION Mood status: controlled Satisfied with  current treatment?: yes Symptom severity: mild  Duration of current treatment : not on anything Psychotherapy/counseling: no  Depressed mood: no Anxious mood: no Anhedonia: no Significant weight loss or gain: no Insomnia: no  Fatigue: yes Feelings of worthlessness or guilt: no Impaired concentration/indecisiveness: no Suicidal ideations: no Hopelessness: no Crying spells: no Depression screen [facility] 2/9 06/13/2019 04/18/2018 12/27/2017 11/24/2017 07/26/2017  Decreased Interest 0 0 _0 Down, Depressed, Hopeless 0 0 1 1 0  PHQ - 2 Score 0 0 _1 Altered sleeping _2 Tired, decreased energy _3 Change in appetite _4 Feeling bad or failure about yourself  1 0 2 2 0  Trouble concentrating 0 0 1 1 0  Moving slowly or fidgety/restless 0 0 1 0 0  Suicidal thoughts 0 0 0 0 0  PHQ-9 Score _5 Difficult doing work/chores Not difficult at all Not difficult at all Very difficult - -    Relevant past medical, surgical, family and social history reviewed and updated as indicated. Interim medical history since our last visit reviewed. Allergies and medications reviewed and updated.  Review of Systems  Constitutional: Negative.   Respiratory: Negative.   Cardiovascular: Negative.   Musculoskeletal: Negative.   Neurological: Negative.   Psychiatric/Behavioral: Negative.     Per HPI unless specifically indicated above     Objective:    BP 126/79   Wt Readings from Last 3 Encounters:  10/27/18 245 lb (111.1 kg)  08/18/18 257 lb (116.6 kg)  07/21/18 261 lb (118.4 kg)    Physical Exam Vitals signs and nursing note reviewed.  Constitutional:      General: She is not in acute distress.    Appearance: Normal appearance. She is not ill-appearing, toxic-appearing or diaphoretic.  HENT:  Head: Normocephalic and atraumatic.     Right Ear: External ear normal.     Left Ear: External ear normal.     Nose: Nose normal.     Mouth/Throat:     Mouth: Mucous  membranes are moist.     Pharynx: Oropharynx is clear.  Eyes:     General: No scleral icterus.       Right eye: No discharge.        Left eye: No discharge.     Conjunctiva/sclera: Conjunctivae normal.     Pupils: Pupils are equal, round, and reactive to light.  Neck:     Musculoskeletal: Normal range of motion.  Pulmonary:     Effort: Pulmonary effort is normal. No respiratory distress.     Comments: Speaking in full sentences Musculoskeletal: Normal range of motion.  Skin:    Coloration: Skin is not jaundiced or pale.     Findings: No bruising, erythema, lesion or rash.  Neurological:     Mental Status: She is alert and oriented to person, place, and time. Mental status is at baseline.  Psychiatric:        Mood and Affect: Mood normal.        Behavior: Behavior normal.        Thought Content: Thought content normal.        Judgment: Judgment normal.     Results for orders placed or performed in visit on 10/31/18  Lipid Panel w/o Chol/HDL Ratio  Result Value Ref Range   Cholesterol, Total 154 100 - 199 mg/dL   Triglycerides 110 0 - 149 mg/dL   HDL 60 >39 mg/dL   VLDL Cholesterol Cal 22 5 - 40 mg/dL   LDL Calculated 72 0 - 99 mg/dL  Comprehensive metabolic panel  Result Value Ref Range   Glucose 185 (H) 65 - 99 mg/dL   BUN 9 6 - 20 mg/dL   Creatinine, Ser 0.61 0.57 - 1.00 mg/dL   GFR calc non Af Amer 126 >59 mL/min/1.73   GFR calc Af Amer 145 >59 mL/min/1.73   BUN/Creatinine Ratio 15 9 - 23   Sodium 139 134 - 144 mmol/L   Potassium 4.5 3.5 - 5.2 mmol/L   Chloride 95 (L) 96 - 106 mmol/L   CO2 24 20 - 29 mmol/L   Calcium 9.9 8.7 - 10.2 mg/dL   Total Protein 7.4 6.0 - 8.5 g/dL   Albumin 4.7 3.9 - 5.0 g/dL   Globulin, Total 2.7 1.5 - 4.5 g/dL   Albumin/Globulin Ratio 1.7 1.2 - 2.2   Bilirubin Total 0.4 0.0 - 1.2 mg/dL   Alkaline Phosphatase 118 (H) 39 - 117 IU/L   AST 13 0 - 40 IU/L   ALT 28 0 - 32 IU/L  Bayer DCA Hb A1c Waived  Result Value Ref Range   HB A1C  (BAYER DCA - WAIVED) 8.2 (H) <7.0 %      Assessment & Plan:   Problem List Items Addressed This Visit      Endocrine   Type 2 diabetes mellitus, uncontrolled, with renal complications (HCC)    Will recheck labs. Await results. Treat as needed. Call with any concerns. Refills given today.       Relevant Medications   atorvastatin (LIPITOR) 40 MG tablet   Exenatide ER (BYDUREON BCISE) 2 MG/0.85ML AUIJ   lisinopril (ZESTRIL) 10 MG tablet   metFORMIN (GLUCOPHAGE-XR) 500 MG 24 hr tablet   Other Relevant Orders   Bayer DCA Hb A1c Waived  CBC with Differential OUT   Comp Met (CMET)   Microalbumin, Urine Waived   UA/M w/rflx Culture, Routine   Hypothyroidism    Will recheck labs. Await results. Treat as needed. Call with any concerns. Refills given today.       Relevant Orders   CBC with Differential OUT   Comp Met (CMET)   TSH   Hyperlipidemia associated with type 2 diabetes mellitus ([place]) - Primary    Will recheck labs. Await results. Treat as needed. Call with any concerns. Refills given today.       Relevant Medications   atorvastatin (LIPITOR) 40 MG tablet   Exenatide ER (BYDUREON BCISE) 2 MG/0.85ML AUIJ   lisinopril (ZESTRIL) 10 MG tablet   metFORMIN (GLUCOPHAGE-XR) 500 MG 24 hr tablet   Other Relevant Orders   CBC with Differential OUT   Comp Met (CMET)   Lipid Panel w/o Chol/HDL Ratio OUT     Genitourinary   Benign hypertensive renal disease    Will recheck labs. Await results. Treat as needed. Call with any concerns. Refills given today.       Relevant Orders   CBC with Differential OUT   Comp Met (CMET)   Microalbumin, Urine Waived   UA/M w/rflx Culture, Routine     Other   Depression    Stable off medicine. Continue to monitor. Call with any concerns.       Relevant Orders   CBC with Differential OUT   Comp Met (CMET)    Other Visit Diagnoses    Immunization due       Will get flu shot tomorrow. Order in.    Relevant Orders   Flu Vaccine QUAD  6+ mos PF IM (Fluarix Quad PF)   Encounter for surveillance of contraceptive pills       Doing well. Refills given today.       Follow up plan: Return in about 3 months (around 09/11/2019).   . This visit was completed via FaceTime due to the restrictions of the COVID-19 pandemic. All issues as above were discussed and addressed. Physical exam was done as above through visual confirmation on FaceTime. If it was felt that the patient should be evaluated in the office, they were directed there. The patient verbally consented to this visit. . Location of the patient: home . Location of the provider: home . Those involved with this call:  . Provider: Park Liter, DO . CMA: Tiffany Reel, CMA . Front Desk/Registration: Don Perking  . Time spent on call: 25 minutes with patient face to face via video conference. More than 50% of this time was spent in counseling and coordination of care. 40 minutes total spent in review of patient's record and preparation of their chart.

## 2019-06-13 NOTE — Assessment & Plan Note (Signed)
Will recheck labs. Await results. Treat as needed. Call with any concerns. Refills given today.  

## 2019-06-13 NOTE — Assessment & Plan Note (Signed)
Stable off medicine. Continue to monitor. Call with any concerns.  

## 2019-06-14 ENCOUNTER — Other Ambulatory Visit: Payer: Medicaid Other

## 2019-06-14 ENCOUNTER — Ambulatory Visit: Payer: Medicaid Other

## 2019-06-14 ENCOUNTER — Other Ambulatory Visit: Payer: Self-pay

## 2019-06-14 DIAGNOSIS — Z23 Encounter for immunization: Secondary | ICD-10-CM

## 2019-06-14 DIAGNOSIS — IMO0002 Reserved for concepts with insufficient information to code with codable children: Secondary | ICD-10-CM

## 2019-06-14 DIAGNOSIS — I129 Hypertensive chronic kidney disease with stage 1 through stage 4 chronic kidney disease, or unspecified chronic kidney disease: Secondary | ICD-10-CM

## 2019-06-14 DIAGNOSIS — E1169 Type 2 diabetes mellitus with other specified complication: Secondary | ICD-10-CM

## 2019-06-14 DIAGNOSIS — F332 Major depressive disorder, recurrent severe without psychotic features: Secondary | ICD-10-CM

## 2019-06-14 DIAGNOSIS — E785 Hyperlipidemia, unspecified: Secondary | ICD-10-CM

## 2019-06-14 DIAGNOSIS — E1129 Type 2 diabetes mellitus with other diabetic kidney complication: Secondary | ICD-10-CM

## 2019-06-14 DIAGNOSIS — E039 Hypothyroidism, unspecified: Secondary | ICD-10-CM

## 2019-06-14 LAB — UA/M W/RFLX CULTURE, ROUTINE
Bilirubin, UA: NEGATIVE
Glucose, UA: NEGATIVE
Ketones, UA: NEGATIVE
Leukocytes,UA: NEGATIVE
Nitrite, UA: NEGATIVE
Specific Gravity, UA: >1.03 — ABNORMAL HIGH (ref 1.005–1.030)
Urobilinogen, Ur: 0.2 mg/dL (ref 0.2–1.0)
pH, UA: 5 (ref 5.0–7.5)

## 2019-06-14 LAB — MICROSCOPIC EXAMINATION: WBC, UA: NONE SEEN /hpf (ref 0–5)

## 2019-06-14 LAB — BAYER DCA HB A1C WAIVED: HB A1C (BAYER DCA - WAIVED): 8.9 % — ABNORMAL HIGH (ref <7.0)

## 2019-06-14 LAB — MICROALBUMIN, URINE WAIVED
Creatinine, Urine Waived: 300 mg/dL (ref 10–300)
Microalb, Ur Waived: 150 mg/L — ABNORMAL HIGH (ref 0–19)

## 2019-06-15 ENCOUNTER — Other Ambulatory Visit: Payer: Self-pay | Admitting: Family Medicine

## 2019-06-15 ENCOUNTER — Telehealth: Payer: Self-pay | Admitting: Family Medicine

## 2019-06-15 LAB — COMPREHENSIVE METABOLIC PANEL
ALT: 49 IU/L — ABNORMAL HIGH (ref 0–32)
AST: 38 IU/L (ref 0–40)
Albumin/Globulin Ratio: 1.4 (ref 1.2–2.2)
Albumin: 4.4 g/dL (ref 3.9–5.0)
Alkaline Phosphatase: 116 IU/L (ref 39–117)
BUN/Creatinine Ratio: 14 (ref 9–23)
BUN: 8 mg/dL (ref 6–20)
Bilirubin Total: 0.4 mg/dL (ref 0.0–1.2)
CO2: 20 mmol/L (ref 20–29)
Calcium: 9.8 mg/dL (ref 8.7–10.2)
Chloride: 100 mmol/L (ref 96–106)
Creatinine, Ser: 0.59 mg/dL (ref 0.57–1.00)
GFR calc Af Amer: 146 mL/min/{1.73_m2} (ref >59)
GFR calc non Af Amer: 127 mL/min/{1.73_m2} (ref >59)
Globulin, Total: 3.2 g/dL (ref 1.5–4.5)
Glucose: 180 mg/dL — ABNORMAL HIGH (ref 65–99)
Potassium: 4.4 mmol/L (ref 3.5–5.2)
Sodium: 139 mmol/L (ref 134–144)
Total Protein: 7.6 g/dL (ref 6.0–8.5)

## 2019-06-15 LAB — CBC WITH DIFFERENTIAL/PLATELET
Basophils Absolute: 0 10*3/uL (ref 0.0–0.2)
Basos: 0 %
EOS (ABSOLUTE): 0.1 10*3/uL (ref 0.0–0.4)
Eos: 1 %
Hematocrit: 46.7 % — ABNORMAL HIGH (ref 34.0–46.6)
Hemoglobin: 15.1 g/dL (ref 11.1–15.9)
Immature Grans (Abs): 0 10*3/uL (ref 0.0–0.1)
Immature Granulocytes: 0 %
Lymphocytes Absolute: 3.6 10*3/uL — ABNORMAL HIGH (ref 0.7–3.1)
Lymphs: 35 %
MCH: 26.5 pg — ABNORMAL LOW (ref 26.6–33.0)
MCHC: 32.3 g/dL (ref 31.5–35.7)
MCV: 82 fL (ref 79–97)
Monocytes Absolute: 0.4 10*3/uL (ref 0.1–0.9)
Monocytes: 4 %
Neutrophils Absolute: 6.1 10*3/uL (ref 1.4–7.0)
Neutrophils: 60 %
Platelets: 330 10*3/uL (ref 150–450)
RBC: 5.7 x10E6/uL — ABNORMAL HIGH (ref 3.77–5.28)
RDW: 13.1 % (ref 11.7–15.4)
WBC: 10.3 10*3/uL (ref 3.4–10.8)

## 2019-06-15 LAB — TSH: TSH: 2.82 u[IU]/mL (ref 0.450–4.500)

## 2019-06-15 LAB — LIPID PANEL W/O CHOL/HDL RATIO
Cholesterol, Total: 235 mg/dL — ABNORMAL HIGH (ref 100–199)
HDL: 63 mg/dL (ref >39)
LDL Chol Calc (NIH): 135 mg/dL — ABNORMAL HIGH (ref 0–99)
Triglycerides: 212 mg/dL — ABNORMAL HIGH (ref 0–149)
VLDL Cholesterol Cal: 37 mg/dL (ref 5–40)

## 2019-06-15 MED ORDER — JARDIANCE 25 MG PO TABS: 25.0000 mg | ORAL_TABLET | Freq: Every day | ORAL | 3 refills | Status: DC

## 2019-06-15 MED ORDER — ATORVASTATIN CALCIUM 80 MG PO TABS: 80.0000 mg | ORAL_TABLET | Freq: Every day | ORAL | 1 refills | Status: DC

## 2019-06-15 NOTE — Telephone Encounter (Signed)
Patient notified and verbalized understanding. 

## 2019-06-15 NOTE — Telephone Encounter (Signed)
Please make sure she is aware of the below:   Eldean, your sugars went up from 8.2 to 8.9 and your cholesterol got a lot worse. Everything looks stable. I've sent though another diabetes medicine to your pharmacy and I want you to take 2 of your cholesterol pill daily and we'll recheck in the spring. Have a good day!

## 2019-11-15 ENCOUNTER — Other Ambulatory Visit: Payer: Self-pay | Admitting: Family Medicine

## 2020-02-01 ENCOUNTER — Telehealth: Payer: Self-pay

## 2020-02-01 NOTE — Telephone Encounter (Signed)
PA for [place] initiated and submitted via Cover My Meds. Key: [id number]

## 2020-02-04 NOTE — Telephone Encounter (Signed)
PA was approved per fax response.

## 2020-03-31 ENCOUNTER — Other Ambulatory Visit: Payer: Self-pay | Admitting: Family Medicine

## 2020-03-31 NOTE — Telephone Encounter (Signed)
LOV -06/13/19

## 2020-04-01 ENCOUNTER — Other Ambulatory Visit: Payer: Self-pay | Admitting: Family Medicine

## 2020-04-01 NOTE — Telephone Encounter (Signed)
Duplicate request- pending with PCP. Attempted to call patient- number is not current/changed- no alternate number for patient.

## 2020-04-07 NOTE — Telephone Encounter (Signed)
Routing to admin

## 2020-04-08 NOTE — Telephone Encounter (Signed)
Number has been disconnected.

## 2020-04-10 NOTE — Telephone Encounter (Signed)
Pt's number still remains disconnected.

## 2020-04-14 ENCOUNTER — Encounter: Payer: Self-pay | Admitting: Family Medicine

## 2020-04-14 NOTE — Telephone Encounter (Signed)
Phone number disconnected. Sent letter.

## 2020-05-06 ENCOUNTER — Other Ambulatory Visit: Payer: Self-pay | Admitting: Family Medicine

## 2020-05-06 NOTE — Telephone Encounter (Signed)
Requested Prescriptions  Pending Prescriptions Disp Refills   [place] 2 MG/0.85ML AUIJ [Pharmacy Med Name: BYDUREON BCISE 2MG/0.85MLAUT INJ 4S] 3.4 mL 0    Sig: INJECT 1 PEN INTO THE SKIN ONCE A WEEK     Endocrinology:  Diabetes - GLP-1 Receptor Agonists - exenatide Failed - 05/06/2020 10:04 AM      Failed - HBA1C is between 0 and 7.9 and within 180 days    HB A1C (BAYER DCA - WAIVED)  Date Value Ref Range Status  06/14/2019 8.9 (H) <7.0 % Final    Comment:                                          Diabetic Adult            <7.0                                       Healthy Adult        4.3 - 5.7                                                           (DCCT/NGSP) American Diabetes Association's Summary of Glycemic Recommendations for Adults with Diabetes: Hemoglobin A1c <7.0%. More stringent glycemic goals (A1c <6.0%) may further reduce complications at the cost of increased risk of hypoglycemia.          Failed - Valid encounter within last 6 months    Recent Outpatient Visits          10 months ago Hyperlipidemia associated with type 2 diabetes mellitus ([place])   [place], Megan P, DO   1 year ago Type 2 diabetes mellitus, uncontrolled, with renal complications ([place])   [place], Megan P, DO   1 year ago Type 2 diabetes mellitus, uncontrolled, with renal complications ([place])   [place], Megan P, DO   1 year ago Type 2 diabetes mellitus, uncontrolled, with renal complications ([place])   [place], Megan P, DO   1 year ago Type 2 diabetes mellitus, uncontrolled, with renal complications ([place])   [place], [place], DO      Future Appointments            In 1 week [place], Megan P, DO [place], [facility]           Passed - Cr in normal range and within 360 days    Creatinine, Ser  Date Value Ref Range Status  06/14/2019 0.59 0.57 - 1.00 mg/dL  Final         Passed - eGFR in normal range and within 360 days    GFR calc Af Amer  Date Value Ref Range Status  06/14/2019 146 >59 mL/min/1.73 Final   GFR calc non Af Amer  Date Value Ref Range Status  06/14/2019 127 >59 mL/min/1.73 Final

## 2020-05-16 ENCOUNTER — Encounter: Payer: Self-pay | Admitting: Family Medicine

## 2020-05-16 ENCOUNTER — Other Ambulatory Visit: Payer: Self-pay

## 2020-05-16 ENCOUNTER — Ambulatory Visit: Payer: Medicaid Other | Admitting: Family Medicine

## 2020-05-16 VITALS — BP 136/81 | HR 71 | Temp 98.3°F | Ht 64.0 in | Wt 273.0 lb

## 2020-05-16 DIAGNOSIS — I129 Hypertensive chronic kidney disease with stage 1 through stage 4 chronic kidney disease, or unspecified chronic kidney disease: Secondary | ICD-10-CM

## 2020-05-16 DIAGNOSIS — E1129 Type 2 diabetes mellitus with other diabetic kidney complication: Secondary | ICD-10-CM | POA: Diagnosis not present

## 2020-05-16 DIAGNOSIS — E039 Hypothyroidism, unspecified: Secondary | ICD-10-CM | POA: Diagnosis not present

## 2020-05-16 DIAGNOSIS — Z1159 Encounter for screening for other viral diseases: Secondary | ICD-10-CM

## 2020-05-16 DIAGNOSIS — F332 Major depressive disorder, recurrent severe without psychotic features: Secondary | ICD-10-CM

## 2020-05-16 DIAGNOSIS — E1165 Type 2 diabetes mellitus with hyperglycemia: Secondary | ICD-10-CM

## 2020-05-16 DIAGNOSIS — E1169 Type 2 diabetes mellitus with other specified complication: Secondary | ICD-10-CM

## 2020-05-16 DIAGNOSIS — E785 Hyperlipidemia, unspecified: Secondary | ICD-10-CM

## 2020-05-16 DIAGNOSIS — IMO0002 Reserved for concepts with insufficient information to code with codable children: Secondary | ICD-10-CM

## 2020-05-16 LAB — MICROALBUMIN, URINE WAIVED
Creatinine, Urine Waived: 100 mg/dL (ref 10–300)
Microalb, Ur Waived: 150 mg/L — ABNORMAL HIGH (ref 0–19)
Microalb/Creat Ratio: >300 mg/g — ABNORMAL HIGH (ref <30)

## 2020-05-16 LAB — BAYER DCA HB A1C WAIVED: HB A1C (BAYER DCA - WAIVED): >14 % — ABNORMAL HIGH (ref <7.0)

## 2020-05-16 LAB — MICROSCOPIC EXAMINATION
Bacteria, UA: NONE SEEN
RBC, Urine: NONE SEEN /hpf (ref 0–2)

## 2020-05-16 LAB — URINALYSIS, ROUTINE W REFLEX MICROSCOPIC
Bilirubin, UA: NEGATIVE
Ketones, UA: NEGATIVE
Leukocytes,UA: NEGATIVE
Nitrite, UA: NEGATIVE
RBC, UA: NEGATIVE
Specific Gravity, UA: 1.02 (ref 1.005–1.030)
Urobilinogen, Ur: 0.2 mg/dL (ref 0.2–1.0)
pH, UA: 5 (ref 5.0–7.5)

## 2020-05-16 MED ORDER — NORETHINDRONE 0.35 MG PO TABS: ORAL_TABLET | ORAL | 12 refills | Status: DC

## 2020-05-16 MED ORDER — METFORMIN HCL ER 500 MG PO TB24
1000.0000 mg | ORAL_TABLET | Freq: Two times a day (BID) | ORAL | 1 refills | Status: DC
Start: 2020-05-16 — End: 2020-12-01

## 2020-05-16 MED ORDER — TRULICITY 0.75 MG/0.5ML ~~LOC~~ SOAJ: 0.7500 mg | SUBCUTANEOUS | 1 refills | Status: DC

## 2020-05-16 MED ORDER — LISINOPRIL 10 MG PO TABS
10.0000 mg | ORAL_TABLET | Freq: Every day | ORAL | 1 refills | Status: DC
Start: 2020-05-16 — End: 2020-12-01

## 2020-05-16 MED ORDER — ATORVASTATIN CALCIUM 80 MG PO TABS
80.0000 mg | ORAL_TABLET | Freq: Every day | ORAL | 1 refills | Status: DC
Start: 2020-05-16 — End: 2020-12-01

## 2020-05-16 MED ORDER — EMPAGLIFLOZIN 25 MG PO TABS
25.0000 mg | ORAL_TABLET | Freq: Every day | ORAL | 3 refills | Status: DC
Start: 2020-05-16 — End: 2020-08-12

## 2020-05-16 NOTE — Assessment & Plan Note (Signed)
Not under good control with A1c of >14.0. Will restart medicine and recheck 1 month. Call with any concerns.

## 2020-05-16 NOTE — Progress Notes (Signed)
BP 136/81   Pulse 71   Temp 98.3 F (36.8 C) (Oral)   Ht '5\' 4"'  (1.626 m)   Wt 273 lb (123.8 kg)   SpO2 98%   BMI 46.86 kg/m    Subjective:    Patient ID: Maria Reyes, female    DOB: Sep 22, 1990, 29 y.o.   MRN: [id number]  HPI: Maria Reyes is a 29 y.o. female  Chief Complaint  Patient presents with  . Diabetes  . Hyperlipidemia   HYPERTENSION / HYPERLIPIDEMIA  Satisfied with current treatment? yes  Duration of hypertension: chronic  BP monitoring frequency: not checking  BP medication side effects: no  Past BP meds: lisinopril  Duration of hyperlipidemia: chronic  Cholesterol medication side effects: no  Cholesterol supplements: none  Past cholesterol medications: atorvastatin  Medication compliance: poor compliance  Aspirin: no  Recent stressors: no  Recurrent headaches: no  Visual changes: no  Palpitations: no  Dyspnea: no  Chest pain: no  Lower extremity edema: no  Dizzy/lightheaded: no  DIABETES  Hypoglycemic episodes:no  Polydipsia/polyuria: yes  Visual disturbance: no  Chest pain: no  Paresthesias: no  Glucose Monitoring: no  Accucheck frequency: Not Checking  Taking Insulin?: no  Blood Pressure Monitoring: not checking  Retinal Examination: Not up to Date  Foot Exam: Done today  Diabetic Education: Completed  Pneumovax: Up to Date  Influenza: Done today  Aspirin: no  HYPOTHYROIDISM  Thyroid control status:untreated  Satisfied with current treatment? no  Medication side effects: not taking her medicine  Medication compliance: poor compliance  Recent dose adjustment:no  Fatigue: yes  Cold intolerance: no  Heat intolerance: no  Weight gain: no  Weight loss: no  Constipation: no  Diarrhea/loose stools: no  Palpitations: no  Lower extremity edema: no  Anxiety/depressed mood: no  DEPRESSION  Mood status: stable  Satisfied with current treatment?: yes  Symptom severity: mild  Duration of current treatment : not on anything    Psychotherapy/counseling: no  Depressed mood: no  Anxious mood: no  Anhedonia: no  Significant weight loss or gain: no  Insomnia: no  Fatigue: yes  Feelings of worthlessness or guilt: no  Impaired concentration/indecisiveness: no  Suicidal ideations: no  Hopelessness: no  Crying spells: no  Depression screen [facility] 2/9 05/16/2020 06/13/2019 04/18/2018 12/27/2017 11/24/2017  Decreased Interest 0 0 0 1 3  Down, Depressed, Hopeless 0 0 0 1 1  PHQ - 2 Score 0 0 0 2 4  Altered sleeping 0 '1 3 3 3  ' Tired, decreased energy 0 '2 1 3 3  ' Change in appetite 0 '2 1 3 3  ' Feeling bad or failure about yourself  0 1 0 2 2  Trouble concentrating 0 0 0 1 1  Moving slowly or fidgety/restless 0 0 0 1 0  Suicidal thoughts 0 0 0 0 0  PHQ-9 Score 0 '6 5 15 16  ' Difficult doing work/chores Not difficult at all Not difficult at all Not difficult at all Very difficult -     Relevant past medical, surgical, family and social history reviewed and updated as indicated. Interim medical history since our last visit reviewed. Allergies and medications reviewed and updated.   Review of Systems  Constitutional: Negative.   Respiratory: Negative.   Cardiovascular: Negative.   Gastrointestinal: Negative.   Endocrine:       Hair has been falling out for about 6 months.   Musculoskeletal: Negative.   Neurological: Positive for numbness. Negative for dizziness, tremors, seizures, syncope, facial asymmetry, speech  difficulty, weakness, light-headedness and headaches.  Psychiatric/Behavioral: Negative.   Per HPI unless specifically indicated above     Objective:    BP 136/81   Pulse 71   Temp 98.3 F (36.8 C) (Oral)   Ht '5\' 4"'  (1.626 m)   Wt 273 lb (123.8 kg)   SpO2 98%   BMI 46.86 kg/m   Wt Readings from Last 3 Encounters:  05/16/20 273 lb (123.8 kg)  10/27/18 245 lb (111.1 kg)  08/18/18 257 lb (116.6 kg)    Physical Exam Vitals and nursing note reviewed.  Constitutional:      General: She is not in  acute distress.    Appearance: Normal appearance. She is obese. She is not ill-appearing, toxic-appearing or diaphoretic.  HENT:     Head: Normocephalic and atraumatic.     Right Ear: External ear normal.     Left Ear: External ear normal.     Nose: Nose normal.     Mouth/Throat:     Mouth: Mucous membranes are moist.     Pharynx: Oropharynx is clear.  Eyes:     General: No scleral icterus.       Right eye: No discharge.        Left eye: No discharge.     Extraocular Movements: Extraocular movements intact.     Conjunctiva/sclera: Conjunctivae normal.     Pupils: Pupils are equal, round, and reactive to light.  Cardiovascular:     Rate and Rhythm: Normal rate and regular rhythm.     Pulses: Normal pulses.     Heart sounds: Normal heart sounds. No murmur heard.  No friction rub. No gallop.   Pulmonary:     Effort: Pulmonary effort is normal. No respiratory distress.     Breath sounds: Normal breath sounds. No stridor. No wheezing, rhonchi or rales.  Chest:     Chest wall: No tenderness.  Musculoskeletal:        General: Normal range of motion.     Cervical back: Normal range of motion and neck supple.  Skin:    General: Skin is warm and dry.     Capillary Refill: Capillary refill takes less than 2 seconds.     Coloration: Skin is not jaundiced or pale.     Findings: No bruising, erythema, lesion or rash.  Neurological:     General: No focal deficit present.     Mental Status: She is alert and oriented to person, place, and time. Mental status is at baseline.  Psychiatric:        Mood and Affect: Mood normal.        Behavior: Behavior normal.        Thought Content: Thought content normal.        Judgment: Judgment normal.     Results for orders placed or performed in visit on 06/14/19  Microscopic Examination   URINE  Result Value Ref Range   WBC, UA None seen 0 - 5 /hpf   RBC 3-10 (A) 0 - 2 /hpf   Epithelial Cells (non renal) 0-10 0 - 10 /hpf   Mucus, UA Present  Not Estab.   Bacteria, UA Few (A) None seen/Few  UA/M w/rflx Culture, Routine   Specimen: Urine   URINE  Result Value Ref Range   Specific Gravity, UA >1.030 (H) 1.005 - 1.030   pH, UA 5.0 5.0 - 7.5   Color, UA Yellow Yellow   Appearance Ur Clear Clear   Leukocytes,UA Negative Negative  Protein,UA 3+ (A) Negative/Trace   Glucose, UA Negative Negative   Ketones, UA Negative Negative   RBC, UA Trace (A) Negative   Bilirubin, UA Negative Negative   Urobilinogen, Ur 0.2 0.2 - 1.0 mg/dL   Nitrite, UA Negative Negative   Microscopic Examination See below:   Bayer DCA Hb A1c Waived  Result Value Ref Range   HB A1C (BAYER DCA - WAIVED) 8.9 (H) <7.0 %  TSH  Result Value Ref Range   TSH 2.820 0.450 - 4.500 uIU/mL  Microalbumin, Urine Waived  Result Value Ref Range   Microalb, Ur Waived 150 (H) 0 - 19 mg/L   Creatinine, Urine Waived 300 10 - 300 mg/dL   Microalb/Creat Ratio 30-300 (H) <30 mg/g  Lipid Panel w/o Chol/HDL Ratio OUT  Result Value Ref Range   Cholesterol, Total 235 (H) 100 - 199 mg/dL   Triglycerides 212 (H) 0 - 149 mg/dL   HDL 63 >39 mg/dL   VLDL Cholesterol Cal 37 5 - 40 mg/dL   LDL Chol Calc (NIH) 135 (H) 0 - 99 mg/dL  Comp Met (CMET)  Result Value Ref Range   Glucose 180 (H) 65 - 99 mg/dL   BUN 8 6 - 20 mg/dL   Creatinine, Ser 0.59 0.57 - 1.00 mg/dL   GFR calc non Af Amer 127 >59 mL/min/1.73   GFR calc Af Amer 146 >59 mL/min/1.73   BUN/Creatinine Ratio 14 9 - 23   Sodium 139 134 - 144 mmol/L   Potassium 4.4 3.5 - 5.2 mmol/L   Chloride 100 96 - 106 mmol/L   CO2 20 20 - 29 mmol/L   Calcium 9.8 8.7 - 10.2 mg/dL   Total Protein 7.6 6.0 - 8.5 g/dL   Albumin 4.4 3.9 - 5.0 g/dL   Globulin, Total 3.2 1.5 - 4.5 g/dL   Albumin/Globulin Ratio 1.4 1.2 - 2.2   Bilirubin Total 0.4 0.0 - 1.2 mg/dL   Alkaline Phosphatase 116 39 - 117 IU/L   AST 38 0 - 40 IU/L   ALT 49 (H) 0 - 32 IU/L  CBC with Differential OUT  Result Value Ref Range   WBC 10.3 3.4 - 10.8 x10E3/uL    RBC 5.70 (H) 3.77 - 5.28 x10E6/uL   Hemoglobin 15.1 11.1 - 15.9 g/dL   Hematocrit 46.7 (H) 34.0 - 46.6 %   MCV 82 79 - 97 fL   MCH 26.5 (L) 26.6 - 33.0 pg   MCHC 32.3 31 - 35 g/dL   RDW 13.1 11.7 - 15.4 %   Platelets 330 150 - 450 x10E3/uL   Neutrophils 60 Not Estab. %   Lymphs 35 Not Estab. %   Monocytes 4 Not Estab. %   Eos 1 Not Estab. %   Basos 0 Not Estab. %   Neutrophils Absolute 6.1 1.40 - 7.00 x10E3/uL   Lymphocytes Absolute 3.6 (H) 0 - 3 x10E3/uL   Monocytes Absolute 0.4 0 - 0 x10E3/uL   EOS (ABSOLUTE) 0.1 0.0 - 0.4 x10E3/uL   Basophils Absolute 0.0 0 - 0 x10E3/uL   Immature Granulocytes 0 Not Estab. %   Immature Grans (Abs) 0.0 0.0 - 0.1 x10E3/uL      Assessment & Plan:   Problem List Items Addressed This Visit      Endocrine   Type 2 diabetes mellitus, uncontrolled, with renal complications ([place]) - Primary    Not under good control with A1c of >14.0. Will restart medicine and recheck 1 month. Call with any concerns.  Relevant Medications   metFORMIN (GLUCOPHAGE-XR) 500 MG 24 hr tablet   lisinopril (ZESTRIL) 10 MG tablet   empagliflozin (JARDIANCE) 25 MG TABS tablet   atorvastatin (LIPITOR) 80 MG tablet   Dulaglutide (TRULICITY) 7.09 GG/8.3MO SOPN   Other Relevant Orders   Bayer DCA Hb A1c Waived   CBC with Differential/Platelet   Comprehensive metabolic panel   Microalbumin, Urine Waived   Urinalysis, Routine w reflex microscopic   Hypothyroidism    Has been off medicine. Will recheck labs and treat as needed.       Relevant Orders   CBC with Differential/Platelet   Comprehensive metabolic panel   TSH   Hyperlipidemia associated with type 2 diabetes mellitus ([place])    Has been off meds. Will check labs and restart meds. Follow up 1 month.       Relevant Medications   metFORMIN (GLUCOPHAGE-XR) 500 MG 24 hr tablet   lisinopril (ZESTRIL) 10 MG tablet   empagliflozin (JARDIANCE) 25 MG TABS tablet   atorvastatin (LIPITOR) 80 MG tablet    Dulaglutide (TRULICITY) 2.94 TM/5.4YT SOPN   Other Relevant Orders   CBC with Differential/Platelet   Comprehensive metabolic panel   Lipid Panel w/o Chol/HDL Ratio     Genitourinary   Benign hypertensive renal disease    Has been off meds. Will check labs and restart meds. Follow up 1 month.       Relevant Orders   CBC with Differential/Platelet   Comprehensive metabolic panel   Microalbumin, Urine Waived   Urinalysis, Routine w reflex microscopic     Other   Depression    Doing well off medicine. Continue to monitor. Continue off medicine.       Relevant Orders   CBC with Differential/Platelet   Comprehensive metabolic panel    Other Visit Diagnoses    Encounter for hepatitis C screening test for low risk patient       Labs drawn today. Await results.    Relevant Orders   Hepatitis C Antibody       Follow up plan: Return in about 6 weeks (around 06/27/2020).

## 2020-05-16 NOTE — Assessment & Plan Note (Signed)
Has been off meds. Will check labs and restart meds. Follow up 1 month.

## 2020-05-16 NOTE — Assessment & Plan Note (Signed)
Has been off medicine. Will recheck labs and treat as needed.

## 2020-05-16 NOTE — Assessment & Plan Note (Signed)
Doing well off medicine. Continue to monitor. Continue off medicine.

## 2020-05-16 NOTE — Assessment & Plan Note (Signed)
Has been off meds. Will check labs and restart meds. Follow up 1 month.  

## 2020-05-17 LAB — COMPREHENSIVE METABOLIC PANEL
ALT: 75 IU/L — ABNORMAL HIGH (ref 0–32)
AST: 60 IU/L — ABNORMAL HIGH (ref 0–40)
Albumin/Globulin Ratio: 1.6 (ref 1.2–2.2)
Albumin: 4.6 g/dL (ref 3.9–5.0)
Alkaline Phosphatase: 121 IU/L (ref 44–121)
BUN/Creatinine Ratio: 11 (ref 9–23)
BUN: 6 mg/dL (ref 6–20)
Bilirubin Total: 0.4 mg/dL (ref 0.0–1.2)
CO2: 22 mmol/L (ref 20–29)
Calcium: 10.2 mg/dL (ref 8.7–10.2)
Chloride: 95 mmol/L — ABNORMAL LOW (ref 96–106)
Creatinine, Ser: 0.56 mg/dL — ABNORMAL LOW (ref 0.57–1.00)
GFR calc Af Amer: 148 mL/min/{1.73_m2} (ref >59)
GFR calc non Af Amer: 128 mL/min/{1.73_m2} (ref >59)
Globulin, Total: 2.9 g/dL (ref 1.5–4.5)
Glucose: 309 mg/dL — ABNORMAL HIGH (ref 65–99)
Potassium: 4.3 mmol/L (ref 3.5–5.2)
Sodium: 135 mmol/L (ref 134–144)
Total Protein: 7.5 g/dL (ref 6.0–8.5)

## 2020-05-17 LAB — LIPID PANEL W/O CHOL/HDL RATIO
Cholesterol, Total: 259 mg/dL — ABNORMAL HIGH (ref 100–199)
HDL: 55 mg/dL (ref >39)
LDL Chol Calc (NIH): 149 mg/dL — ABNORMAL HIGH (ref 0–99)
Triglycerides: 302 mg/dL — ABNORMAL HIGH (ref 0–149)
VLDL Cholesterol Cal: 55 mg/dL — ABNORMAL HIGH (ref 5–40)

## 2020-05-17 LAB — CBC WITH DIFFERENTIAL/PLATELET
Basophils Absolute: 0.1 10*3/uL (ref 0.0–0.2)
Basos: 1 %
EOS (ABSOLUTE): 0.1 10*3/uL (ref 0.0–0.4)
Eos: 1 %
Hematocrit: 47 % — ABNORMAL HIGH (ref 34.0–46.6)
Hemoglobin: 15.3 g/dL (ref 11.1–15.9)
Immature Grans (Abs): 0 10*3/uL (ref 0.0–0.1)
Immature Granulocytes: 1 %
Lymphocytes Absolute: 2.9 10*3/uL (ref 0.7–3.1)
Lymphs: 34 %
MCH: 27.1 pg (ref 26.6–33.0)
MCHC: 32.6 g/dL (ref 31.5–35.7)
MCV: 83 fL (ref 79–97)
Monocytes Absolute: 0.4 10*3/uL (ref 0.1–0.9)
Monocytes: 4 %
Neutrophils Absolute: 5.2 10*3/uL (ref 1.4–7.0)
Neutrophils: 59 %
Platelets: 296 10*3/uL (ref 150–450)
RBC: 5.65 x10E6/uL — ABNORMAL HIGH (ref 3.77–5.28)
RDW: 12.9 % (ref 11.7–15.4)
WBC: 8.7 10*3/uL (ref 3.4–10.8)

## 2020-05-17 LAB — TSH: TSH: 2.92 u[IU]/mL (ref 0.450–4.500)

## 2020-05-17 LAB — HEPATITIS C ANTIBODY: Hep C Virus Ab: <0.1 s/co ratio (ref 0.0–0.9)

## 2020-07-01 ENCOUNTER — Ambulatory Visit (INDEPENDENT_AMBULATORY_CARE_PROVIDER_SITE_OTHER): Payer: Medicaid Other | Admitting: Family Medicine

## 2020-07-01 ENCOUNTER — Other Ambulatory Visit: Payer: Self-pay

## 2020-07-01 ENCOUNTER — Encounter: Payer: Self-pay | Admitting: Family Medicine

## 2020-07-01 VITALS — BP 112/73 | HR 70 | Temp 98.1°F | Wt 259.6 lb

## 2020-07-01 DIAGNOSIS — IMO0002 Reserved for concepts with insufficient information to code with codable children: Secondary | ICD-10-CM

## 2020-07-01 DIAGNOSIS — E1169 Type 2 diabetes mellitus with other specified complication: Secondary | ICD-10-CM

## 2020-07-01 DIAGNOSIS — E1129 Type 2 diabetes mellitus with other diabetic kidney complication: Secondary | ICD-10-CM

## 2020-07-01 DIAGNOSIS — E039 Hypothyroidism, unspecified: Secondary | ICD-10-CM | POA: Diagnosis not present

## 2020-07-01 DIAGNOSIS — E785 Hyperlipidemia, unspecified: Secondary | ICD-10-CM

## 2020-07-01 DIAGNOSIS — I129 Hypertensive chronic kidney disease with stage 1 through stage 4 chronic kidney disease, or unspecified chronic kidney disease: Secondary | ICD-10-CM | POA: Diagnosis not present

## 2020-07-01 DIAGNOSIS — E66813 Obesity, class 3: Secondary | ICD-10-CM

## 2020-07-01 DIAGNOSIS — E1165 Type 2 diabetes mellitus with hyperglycemia: Secondary | ICD-10-CM

## 2020-07-01 NOTE — Assessment & Plan Note (Signed)
Due for recheck in 6 weeks. Continue current regimen now and recheck 6 weeks. Call with any concerns.

## 2020-07-01 NOTE — Progress Notes (Signed)
BP 112/73   Pulse 70   Temp 98.1 F (36.7 C)   Wt 259 lb 9.6 oz (117.8 kg)   SpO2 99%   BMI 44.56 kg/m    Subjective:    Patient ID: Maria Reyes, female    DOB: 06/04/1991, 29 y.o.   MRN: [id number]  HPI: Maria Reyes is a 29 y.o. female  Chief Complaint  Patient presents with  . Diabetes  . Hypothyroidism  . Hyperlipidemia   DIABETES Hypoglycemic episodes:no Polydipsia/polyuria: no Visual disturbance: no Chest pain: no Paresthesias: no Glucose Monitoring: yes  Accucheck frequency: Daily  Fasting glucose: 140-160 Taking Insulin?: no Blood Pressure Monitoring: not checking Retinal Examination: Not up to Date Foot Exam: Up to Date Diabetic Education: Completed Pneumovax: Up to Date Influenza: Not up to Date Aspirin: no  HYPERTENSION / HYPERLIPIDEMIA Satisfied with current treatment? yes Duration of hypertension: chronic BP monitoring frequency: not checking BP medication side effects: no Past BP meds: lisinopril Duration of hyperlipidemia: chronic Cholesterol medication side effects: no Cholesterol supplements: none Past cholesterol medications: atorvastatin Medication compliance: good compliance Aspirin: no Recent stressors: no Recurrent headaches: no Visual changes: no Palpitations: no Dyspnea: no Chest pain: no Lower extremity edema: no Dizzy/lightheaded: no   HYPOTHYROIDISM Thyroid control status:better Satisfied with current treatment? yes Medication side effects: no Medication compliance: excellent compliance Recent dose adjustment:yes Fatigue: no Cold intolerance: no Heat intolerance: no Weight gain: no Weight loss: yes Constipation: no Diarrhea/loose stools: no Palpitations: no Lower extremity edema: no Anxiety/depressed mood: no  Relevant past medical, surgical, family and social history reviewed and updated as indicated. Interim medical history since our last visit reviewed. Allergies and medications reviewed and  updated.  Review of Systems  Constitutional: Negative.   Respiratory: Negative.   Cardiovascular: Negative.   Gastrointestinal: Negative.   Musculoskeletal: Negative.   Neurological: Negative.   Psychiatric/Behavioral: Negative.     Per HPI unless specifically indicated above     Objective:    BP 112/73   Pulse 70   Temp 98.1 F (36.7 C)   Wt 259 lb 9.6 oz (117.8 kg)   SpO2 99%   BMI 44.56 kg/m   Wt Readings from Last 3 Encounters:  07/01/20 259 lb 9.6 oz (117.8 kg)  05/16/20 273 lb (123.8 kg)  10/27/18 245 lb (111.1 kg)    Physical Exam Vitals and nursing note reviewed.  Constitutional:      General: She is not in acute distress.    Appearance: Normal appearance. She is obese. She is not ill-appearing, toxic-appearing or diaphoretic.  HENT:     Head: Normocephalic and atraumatic.     Right Ear: External ear normal.     Left Ear: External ear normal.     Nose: Nose normal.     Mouth/Throat:     Mouth: Mucous membranes are moist.     Pharynx: Oropharynx is clear.  Eyes:     General: No scleral icterus.       Right eye: No discharge.        Left eye: No discharge.     Extraocular Movements: Extraocular movements intact.     Conjunctiva/sclera: Conjunctivae normal.     Pupils: Pupils are equal, round, and reactive to light.  Cardiovascular:     Rate and Rhythm: Normal rate and regular rhythm.     Pulses: Normal pulses.     Heart sounds: Normal heart sounds. No murmur heard. No friction rub. No gallop.   Pulmonary:     Effort:  Pulmonary effort is normal. No respiratory distress.     Breath sounds: Normal breath sounds. No stridor. No wheezing, rhonchi or rales.  Chest:     Chest wall: No tenderness.  Musculoskeletal:        General: Normal range of motion.     Cervical back: Normal range of motion and neck supple.  Skin:    General: Skin is warm and dry.     Capillary Refill: Capillary refill takes less than 2 seconds.     Coloration: Skin is not  jaundiced or pale.     Findings: No bruising, erythema, lesion or rash.  Neurological:     General: No focal deficit present.     Mental Status: She is alert and oriented to person, place, and time. Mental status is at baseline.  Psychiatric:        Mood and Affect: Mood normal.        Behavior: Behavior normal.        Thought Content: Thought content normal.        Judgment: Judgment normal.     Results for orders placed or performed in visit on 05/16/20  Microscopic Examination   BLD  Result Value Ref Range   WBC, UA 0-5 0 - 5 /hpf   RBC None seen 0 - 2 /hpf   Epithelial Cells (non renal) 0-10 0 - 10 /hpf   Bacteria, UA None seen None seen/Few   Yeast, UA Present None seen  Bayer DCA Hb A1c Waived  Result Value Ref Range   HB A1C (BAYER DCA - WAIVED) >14.0 (H) <7.0 %  CBC with Differential/Platelet  Result Value Ref Range   WBC 8.7 3.4 - 10.8 x10E3/uL   RBC 5.65 (H) 3.77 - 5.28 x10E6/uL   Hemoglobin 15.3 11.1 - 15.9 g/dL   Hematocrit 30.1 (H) 60.1 - 46.6 %   MCV 83 79 - 97 fL   MCH 27.1 26.6 - 33.0 pg   MCHC 32.6 31.5 - 35.7 g/dL   RDW 09.3 23.5 - 57.3 %   Platelets 296 150 - 450 x10E3/uL   Neutrophils 59 Not Estab. %   Lymphs 34 Not Estab. %   Monocytes 4 Not Estab. %   Eos 1 Not Estab. %   Basos 1 Not Estab. %   Neutrophils Absolute 5.2 1.4 - 7.0 x10E3/uL   Lymphocytes Absolute 2.9 0.7 - 3.1 x10E3/uL   Monocytes Absolute 0.4 0.1 - 0.9 x10E3/uL   EOS (ABSOLUTE) 0.1 0.0 - 0.4 x10E3/uL   Basophils Absolute 0.1 0.0 - 0.2 x10E3/uL   Immature Granulocytes 1 Not Estab. %   Immature Grans (Abs) 0.0 0.0 - 0.1 x10E3/uL  Comprehensive metabolic panel  Result Value Ref Range   Glucose 309 (H) 65 - 99 mg/dL   BUN 6 6 - 20 mg/dL   Creatinine, Ser 2.20 (L) 0.57 - 1.00 mg/dL   GFR calc non Af Amer 128 >59 mL/min/1.73   GFR calc Af Amer 148 >59 mL/min/1.73   BUN/Creatinine Ratio 11 9 - 23   Sodium 135 134 - 144 mmol/L   Potassium 4.3 3.5 - 5.2 mmol/L   Chloride 95 (L) 96  - 106 mmol/L   CO2 22 20 - 29 mmol/L   Calcium 10.2 8.7 - 10.2 mg/dL   Total Protein 7.5 6.0 - 8.5 g/dL   Albumin 4.6 3.9 - 5.0 g/dL   Globulin, Total 2.9 1.5 - 4.5 g/dL   Albumin/Globulin Ratio 1.6 1.2 - 2.2   Bilirubin  Total 0.4 0.0 - 1.2 mg/dL   Alkaline Phosphatase 121 44 - 121 IU/L   AST 60 (H) 0 - 40 IU/L   ALT 75 (H) 0 - 32 IU/L  Lipid Panel w/o Chol/HDL Ratio  Result Value Ref Range   Cholesterol, Total 259 (H) 100 - 199 mg/dL   Triglycerides 702 (H) 0 - 149 mg/dL   HDL 55 >63 mg/dL   VLDL Cholesterol Cal 55 (H) 5 - 40 mg/dL   LDL Chol Calc (NIH) 785 (H) 0 - 99 mg/dL  Microalbumin, Urine Waived  Result Value Ref Range   Microalb, Ur Waived 150 (H) 0 - 19 mg/L   Creatinine, Urine Waived 100 10 - 300 mg/dL   Microalb/Creat Ratio >300 (H) <30 mg/g  TSH  Result Value Ref Range   TSH 2.920 0.450 - 4.500 uIU/mL  Urinalysis, Routine w reflex microscopic  Result Value Ref Range   Specific Gravity, UA 1.020 1.005 - 1.030   pH, UA 5.0 5.0 - 7.5   Color, UA Yellow Yellow   Appearance Ur Hazy (A) Clear   Leukocytes,UA Negative Negative   Protein,UA 2+ (A) Negative/Trace   Glucose, UA 3+ (A) Negative   Ketones, UA Negative Negative   RBC, UA Negative Negative   Bilirubin, UA Negative Negative   Urobilinogen, Ur 0.2 0.2 - 1.0 mg/dL   Nitrite, UA Negative Negative   Microscopic Examination See below:   Hepatitis C Antibody  Result Value Ref Range   Hep C Virus Ab <0.1 0.0 - 0.9 s/co ratio      Assessment & Plan:   Problem List Items Addressed This Visit      Endocrine   Type 2 diabetes mellitus, uncontrolled, with renal complications ([facility])    Due for recheck in 6 weeks. Continue current regimen now and recheck 6 weeks. Call with any concerns.       Relevant Orders   Comprehensive metabolic panel   Hypothyroidism - Primary    Feeling well. Rechecking labs today. Await results. Treat as needed.       Relevant Orders   Comprehensive metabolic panel   TSH    Hyperlipidemia associated with type 2 diabetes mellitus ([facility])    Under good control on current regimen. Continue current regimen. Continue to monitor. Call with any concerns. Refills given. Labs drawn today.       Relevant Orders   Comprehensive metabolic panel   Lipid Panel w/o Chol/HDL Ratio     Genitourinary   Benign hypertensive renal disease    Under good control on current regimen. Continue current regimen. Continue to monitor. Call with any concerns. Refills given. Labs drawn today.      Relevant Orders   Comprehensive metabolic panel     Other   Obesity, Class III, BMI 40-49.9 (morbid obesity) ([facility])    Encouraged diet and exercise with goal of losing 1-2lbs per week. Congratulated patient on 13lb weight loss!          Follow up plan: Return in about 6 weeks (around 08/12/2020).

## 2020-07-01 NOTE — Assessment & Plan Note (Addendum)
Encouraged diet and exercise with goal of losing 1-2lbs per week. Congratulated patient on 13lb weight loss!

## 2020-07-01 NOTE — Assessment & Plan Note (Signed)
Under good control on current regimen. Continue current regimen. Continue to monitor. Call with any concerns. Refills given. Labs drawn today.   

## 2020-07-01 NOTE — Assessment & Plan Note (Signed)
Feeling well. Rechecking labs today. Await results. Treat as needed.

## 2020-07-02 LAB — LIPID PANEL W/O CHOL/HDL RATIO
Cholesterol, Total: 117 mg/dL (ref 100–199)
HDL: 35 mg/dL — ABNORMAL LOW (ref >39)
LDL Chol Calc (NIH): 57 mg/dL (ref 0–99)
Triglycerides: 144 mg/dL (ref 0–149)
VLDL Cholesterol Cal: 25 mg/dL (ref 5–40)

## 2020-07-02 LAB — COMPREHENSIVE METABOLIC PANEL
ALT: 38 IU/L — ABNORMAL HIGH (ref 0–32)
AST: 27 IU/L (ref 0–40)
Albumin/Globulin Ratio: 1.9 (ref 1.2–2.2)
Albumin: 4.7 g/dL (ref 3.9–5.0)
Alkaline Phosphatase: 93 IU/L (ref 44–121)
BUN/Creatinine Ratio: 14 (ref 9–23)
BUN: 7 mg/dL (ref 6–20)
Bilirubin Total: 1 mg/dL (ref 0.0–1.2)
CO2: 21 mmol/L (ref 20–29)
Calcium: 9.5 mg/dL (ref 8.7–10.2)
Chloride: 100 mmol/L (ref 96–106)
Creatinine, Ser: 0.5 mg/dL — ABNORMAL LOW (ref 0.57–1.00)
GFR calc Af Amer: 153 mL/min/{1.73_m2} (ref >59)
GFR calc non Af Amer: 133 mL/min/{1.73_m2} (ref >59)
Globulin, Total: 2.5 g/dL (ref 1.5–4.5)
Glucose: 112 mg/dL — ABNORMAL HIGH (ref 65–99)
Potassium: 3.8 mmol/L (ref 3.5–5.2)
Sodium: 139 mmol/L (ref 134–144)
Total Protein: 7.2 g/dL (ref 6.0–8.5)

## 2020-07-02 LAB — TSH: TSH: 2.95 u[IU]/mL (ref 0.450–4.500)

## 2020-07-13 ENCOUNTER — Other Ambulatory Visit: Payer: Self-pay | Admitting: Family Medicine

## 2020-08-12 ENCOUNTER — Encounter: Payer: Self-pay | Admitting: Family Medicine

## 2020-08-12 ENCOUNTER — Ambulatory Visit: Payer: Medicaid Other | Admitting: Family Medicine

## 2020-08-12 ENCOUNTER — Other Ambulatory Visit: Payer: Self-pay

## 2020-08-12 VITALS — BP 115/68 | HR 76 | Temp 98.3°F | Wt 245.2 lb

## 2020-08-12 DIAGNOSIS — E1165 Type 2 diabetes mellitus with hyperglycemia: Secondary | ICD-10-CM | POA: Diagnosis not present

## 2020-08-12 DIAGNOSIS — E1129 Type 2 diabetes mellitus with other diabetic kidney complication: Secondary | ICD-10-CM

## 2020-08-12 DIAGNOSIS — IMO0002 Reserved for concepts with insufficient information to code with codable children: Secondary | ICD-10-CM

## 2020-08-12 LAB — BAYER DCA HB A1C WAIVED: HB A1C (BAYER DCA - WAIVED): 6.9 % (ref <7.0)

## 2020-08-12 MED ORDER — TRULICITY 0.75 MG/0.5ML ~~LOC~~ SOAJ: SUBCUTANEOUS | 1 refills | Status: DC

## 2020-08-12 MED ORDER — EMPAGLIFLOZIN 25 MG PO TABS: 25.0000 mg | ORAL_TABLET | Freq: Every day | ORAL | 1 refills | Status: DC

## 2020-08-12 NOTE — Assessment & Plan Note (Signed)
Congratulated patient on 28lb weight loss! Continue diet and exercise. Continue to monitor. Recheck 3 months.

## 2020-08-12 NOTE — Progress Notes (Signed)
BP 115/68   Pulse 76   Temp 98.3 F (36.8 C) (Oral)   Wt 245 lb 3.2 oz (111.2 kg)   LMP 08/07/2020 (Exact Date)   SpO2 98%   BMI 42.09 kg/m    Subjective:    Patient ID: Maria Reyes, female    DOB: October 17, 1990, 30 y.o.   MRN: [id number]  HPI: Maria Reyes is a 30 y.o. female  Chief Complaint  Patient presents with  . Diabetes    Relevant past medical, surgical, family and social history reviewed and updated as indicated. Interim medical history since our last visit reviewed. Allergies and medications reviewed and updated.  Review of Systems  Constitutional: Negative.   Respiratory: Negative.   Cardiovascular: Negative.   Gastrointestinal: Negative.   Musculoskeletal: Negative.   Psychiatric/Behavioral: Negative.     Per HPI unless specifically indicated above     Objective:    BP 115/68   Pulse 76   Temp 98.3 F (36.8 C) (Oral)   Wt 245 lb 3.2 oz (111.2 kg)   LMP 08/07/2020 (Exact Date)   SpO2 98%   BMI 42.09 kg/m   Wt Readings from Last 3 Encounters:  08/12/20 245 lb 3.2 oz (111.2 kg)  07/01/20 259 lb 9.6 oz (117.8 kg)  05/16/20 273 lb (123.8 kg)    Physical Exam Vitals and nursing note reviewed.  Constitutional:      General: She is not in acute distress.    Appearance: Normal appearance. She is not ill-appearing, toxic-appearing or diaphoretic.  HENT:     Head: Normocephalic and atraumatic.     Right Ear: External ear normal.     Left Ear: External ear normal.     Nose: Nose normal.     Mouth/Throat:     Mouth: Mucous membranes are moist.     Pharynx: Oropharynx is clear.  Eyes:     General: No scleral icterus.       Right eye: No discharge.        Left eye: No discharge.     Extraocular Movements: Extraocular movements intact.     Conjunctiva/sclera: Conjunctivae normal.     Pupils: Pupils are equal, round, and reactive to light.  Cardiovascular:     Rate and Rhythm: Normal rate and regular rhythm.     Pulses: Normal pulses.      Heart sounds: Normal heart sounds. No murmur heard. No friction rub. No gallop.   Pulmonary:     Effort: Pulmonary effort is normal. No respiratory distress.     Breath sounds: Normal breath sounds. No stridor. No wheezing, rhonchi or rales.  Chest:     Chest wall: No tenderness.  Musculoskeletal:        General: Normal range of motion.     Cervical back: Normal range of motion and neck supple.  Skin:    General: Skin is warm and dry.     Capillary Refill: Capillary refill takes less than 2 seconds.     Coloration: Skin is not jaundiced or pale.     Findings: No bruising, erythema, lesion or rash.  Neurological:     General: No focal deficit present.     Mental Status: She is alert and oriented to person, place, and time. Mental status is at baseline.  Psychiatric:        Mood and Affect: Mood normal.        Behavior: Behavior normal.        Thought Content: Thought content normal.  Judgment: Judgment normal.     Results for orders placed or performed in visit on 07/01/20  Comprehensive metabolic panel  Result Value Ref Range   Glucose 112 (H) 65 - 99 mg/dL   BUN 7 6 - 20 mg/dL   Creatinine, Ser 7.34 (L) 0.57 - 1.00 mg/dL   GFR calc non Af Amer 133 >59 mL/min/1.73   GFR calc Af Amer 153 >59 mL/min/1.73   BUN/Creatinine Ratio 14 9 - 23   Sodium 139 134 - 144 mmol/L   Potassium 3.8 3.5 - 5.2 mmol/L   Chloride 100 96 - 106 mmol/L   CO2 21 20 - 29 mmol/L   Calcium 9.5 8.7 - 10.2 mg/dL   Total Protein 7.2 6.0 - 8.5 g/dL   Albumin 4.7 3.9 - 5.0 g/dL   Globulin, Total 2.5 1.5 - 4.5 g/dL   Albumin/Globulin Ratio 1.9 1.2 - 2.2   Bilirubin Total 1.0 0.0 - 1.2 mg/dL   Alkaline Phosphatase 93 44 - 121 IU/L   AST 27 0 - 40 IU/L   ALT 38 (H) 0 - 32 IU/L  TSH  Result Value Ref Range   TSH 2.950 0.450 - 4.500 uIU/mL  Lipid Panel w/o Chol/HDL Ratio  Result Value Ref Range   Cholesterol, Total 117 100 - 199 mg/dL   Triglycerides 287 0 - 149 mg/dL   HDL 35 (L) >68 mg/dL    VLDL Cholesterol Cal 25 5 - 40 mg/dL   LDL Chol Calc (NIH) 57 0 - 99 mg/dL      Assessment & Plan:   Problem List Items Addressed This Visit      Endocrine   Type 2 diabetes mellitus, uncontrolled, with renal complications (HCC) - Primary    Doing great with A1c of 6.9 down from >14. Continue current regimen. Continue to monitor. Continue weight loss. Recheck 3 months- if A1c below 6 will stop jardiance.       Relevant Medications   Dulaglutide (TRULICITY) 0.75 MG/0.5ML SOPN   empagliflozin (JARDIANCE) 25 MG TABS tablet   Other Relevant Orders   Bayer DCA Hb A1c Waived     Other   Obesity, Class III, BMI 40-49.9 (morbid obesity) (HCC)    Congratulated patient on 28lb weight loss! Continue diet and exercise. Continue to monitor. Recheck 3 months.       Relevant Medications   Dulaglutide (TRULICITY) 0.75 MG/0.5ML SOPN   empagliflozin (JARDIANCE) 25 MG TABS tablet       Follow up plan: Return in about 3 months (around 11/09/2020).

## 2020-08-12 NOTE — Assessment & Plan Note (Signed)
Doing great with A1c of 6.9 down from >14. Continue current regimen. Continue to monitor. Continue weight loss. Recheck 3 months- if A1c below 6 will stop jardiance.

## 2020-08-24 ENCOUNTER — Encounter: Payer: Self-pay | Admitting: Family Medicine

## 2020-08-25 MED ORDER — LEVOTHYROXINE SODIUM 25 MCG PO TABS: ORAL_TABLET | ORAL | 3 refills | Status: DC

## 2020-09-08 ENCOUNTER — Other Ambulatory Visit: Payer: Self-pay | Admitting: Family Medicine

## 2020-09-27 ENCOUNTER — Other Ambulatory Visit: Payer: Self-pay | Admitting: Family Medicine

## 2020-10-03 ENCOUNTER — Other Ambulatory Visit: Payer: Self-pay | Admitting: Family Medicine

## 2020-11-11 ENCOUNTER — Ambulatory Visit: Payer: Medicaid Other | Admitting: Family Medicine

## 2020-11-19 ENCOUNTER — Ambulatory Visit: Payer: Medicaid Other | Admitting: Family Medicine

## 2020-11-27 ENCOUNTER — Ambulatory Visit: Payer: Self-pay | Admitting: *Deleted

## 2020-11-27 NOTE — Telephone Encounter (Addendum)
Lightheaded last night and today. Feeling of floating/air-headed, intermittent. Mostly with change of positions. Recently whole family with virus including severe headache, fever, body aches. No Covid testing performed and is not vaccinated. Care Advice reviewed. Patient has existing appointment on   Reason for Disposition . [1] MILD dizziness (e.g., walking normally) AND [2] has NOT been evaluated by physician for this  (Exception: dizziness caused by heat exposure, sudden standing, or poor fluid intake)  Answer Assessment - Initial Assessment Questions 1. DESCRIPTION: "Describe your dizziness."     Air headed feeling 2. LIGHTHEADED: "Do you feel lightheaded?" (e.g., somewhat faint, woozy, weak upon standing)     Woozy high like 3. VERTIGO: "Do you feel like either you or the room is spinning or tilting?" (i.e. vertigo)     no 4. SEVERITY: "How bad is it?"  "Do you feel like you are going to faint?" "Can you stand and walk?yes   - MILD: Feels slightly dizzy, but walking normally.   - MODERATE: Feels unsteady when walking, but not falling; interferes with normal activities (e.g., school, work).   - SEVERE: Unable to walk without falling, or requires assistance to walk without falling; feels like passing out now.      Comes and goes 5. ONSET:  "When did the dizziness begin?"     Last night 6. AGGRAVATING FACTORS: "Does anything make it worse?" (e.g., standing, change in head position)    Changing positions 7. HEART RATE: "Can you tell me your heart rate?" "How many beats in 15 seconds?"  (Note: not all patients can do this)       no 8. CAUSE: "What do you think is causing the dizziness?"     no 9. RECURRENT SYMPTOM: "Have you had dizziness before?" If Yes, ask: "When was the last time?" "What happened that time?"     no 10. OTHER SYMPTOMS: "Do you have any other symptoms?" (e.g., fever, chest pain, vomiting, diarrhea, bleeding)       High fever with virus about 5 days ago 11. PREGNANCY:  "Is there any chance you are pregnant?" "When was your last menstrual period?"       no  Protocols used: DIZZINESS [facility]

## 2020-12-01 ENCOUNTER — Other Ambulatory Visit: Payer: Self-pay

## 2020-12-01 ENCOUNTER — Encounter: Payer: Self-pay | Admitting: Family Medicine

## 2020-12-01 ENCOUNTER — Ambulatory Visit: Payer: Medicaid Other | Admitting: Family Medicine

## 2020-12-01 VITALS — BP 119/78 | HR 83 | Temp 98.2°F | Wt 238.4 lb

## 2020-12-01 DIAGNOSIS — E1129 Type 2 diabetes mellitus with other diabetic kidney complication: Secondary | ICD-10-CM | POA: Diagnosis not present

## 2020-12-01 DIAGNOSIS — E785 Hyperlipidemia, unspecified: Secondary | ICD-10-CM

## 2020-12-01 DIAGNOSIS — E039 Hypothyroidism, unspecified: Secondary | ICD-10-CM | POA: Diagnosis not present

## 2020-12-01 DIAGNOSIS — I129 Hypertensive chronic kidney disease with stage 1 through stage 4 chronic kidney disease, or unspecified chronic kidney disease: Secondary | ICD-10-CM

## 2020-12-01 DIAGNOSIS — E1169 Type 2 diabetes mellitus with other specified complication: Secondary | ICD-10-CM

## 2020-12-01 DIAGNOSIS — IMO0002 Reserved for concepts with insufficient information to code with codable children: Secondary | ICD-10-CM

## 2020-12-01 DIAGNOSIS — R42 Dizziness and giddiness: Secondary | ICD-10-CM

## 2020-12-01 DIAGNOSIS — E1165 Type 2 diabetes mellitus with hyperglycemia: Secondary | ICD-10-CM

## 2020-12-01 LAB — URINALYSIS, ROUTINE W REFLEX MICROSCOPIC
Bilirubin, UA: NEGATIVE
Ketones, UA: NEGATIVE
Leukocytes,UA: NEGATIVE
Nitrite, UA: NEGATIVE
Protein,UA: NEGATIVE
RBC, UA: NEGATIVE
Specific Gravity, UA: 1.01 (ref 1.005–1.030)
Urobilinogen, Ur: 0.2 mg/dL (ref 0.2–1.0)
pH, UA: 5 (ref 5.0–7.5)

## 2020-12-01 LAB — MICROALBUMIN, URINE WAIVED
Creatinine, Urine Waived: 50 mg/dL (ref 10–300)
Microalb, Ur Waived: 30 mg/L — ABNORMAL HIGH (ref 0–19)

## 2020-12-01 LAB — BAYER DCA HB A1C WAIVED: HB A1C (BAYER DCA - WAIVED): 7.3 % — ABNORMAL HIGH (ref <7.0)

## 2020-12-01 MED ORDER — METFORMIN HCL ER 500 MG PO TB24: 1000.0000 mg | ORAL_TABLET | Freq: Two times a day (BID) | ORAL | 1 refills | Status: DC

## 2020-12-01 MED ORDER — TRULICITY 1.5 MG/0.5ML ~~LOC~~ SOAJ: 1.5000 mg | SUBCUTANEOUS | 2 refills | Status: AC

## 2020-12-01 MED ORDER — LISINOPRIL 10 MG PO TABS: 10.0000 mg | ORAL_TABLET | Freq: Every day | ORAL | 1 refills | Status: DC

## 2020-12-01 MED ORDER — ATORVASTATIN CALCIUM 80 MG PO TABS: 80.0000 mg | ORAL_TABLET | Freq: Every day | ORAL | 1 refills | Status: DC

## 2020-12-01 MED ORDER — EMPAGLIFLOZIN 25 MG PO TABS: ORAL_TABLET | ORAL | 1 refills | Status: DC

## 2020-12-01 NOTE — Assessment & Plan Note (Signed)
Rechecking labs today. Await results. Treat as needed.  °

## 2020-12-01 NOTE — Assessment & Plan Note (Signed)
Under good control on current regimen. Continue current regimen. Continue to monitor. Call with any concerns. Refills given. Labs drawn today.   

## 2020-12-01 NOTE — Progress Notes (Signed)
BP 119/78   Pulse 83   Temp 98.2 F (36.8 C)   Wt 238 lb 6.4 oz (108.1 kg)   SpO2 100%   BMI 40.92 kg/m    Subjective:    Patient ID: Maria Reyes, female    DOB: 1990-09-13, 30 y.o.   MRN: [id number]  HPI: Maria Reyes is a 30 y.o. female  Chief Complaint  Patient presents with  . Diabetes  . Hypothyroidism  . Hyperlipidemia  . Dizziness    Patient states she was recently sick and was feeling dizzy. Patient is no longer feeling dizzy.    Was sick last week, but feeling better now.   DIABETES Hypoglycemic episodes:no Polydipsia/polyuria: no Visual disturbance: no Chest pain: no Paresthesias: no Glucose Monitoring: no  Accucheck frequency: occasionally Taking Insulin?: no Blood Pressure Monitoring: not checking Retinal Examination: Not up to Date Foot Exam: Up to Date Diabetic Education: Completed Pneumovax: Up to Date Influenza: Up to Date Aspirin: no  HYPERTENSION / HYPERLIPIDEMIA Satisfied with current treatment? yes Duration of hypertension: chronic BP monitoring frequency: not checking BP medication side effects: no Past BP meds: lisinopril Duration of hyperlipidemia: chronic Cholesterol medication side effects: no Cholesterol supplements: none Past cholesterol medications: atorvastatin Medication compliance: excellent compliance Aspirin: no Recent stressors: no Recurrent headaches: no Visual changes: no Palpitations: no Dyspnea: no Chest pain: no Lower extremity edema: no Dizzy/lightheaded: no  HYPOTHYROIDISM Thyroid control status:controlled Satisfied with current treatment? yes Medication side effects: no Medication compliance: excellent compliance Recent dose adjustment:no Fatigue: no Cold intolerance: no Heat intolerance: no Weight gain: no Weight loss: no Constipation: no Diarrhea/loose stools: no Palpitations: no Lower extremity edema: no Anxiety/depressed mood: no  Relevant past medical, surgical, family and social  history reviewed and updated as indicated. Interim medical history since our last visit reviewed. Allergies and medications reviewed and updated.  Review of Systems  Constitutional: Negative.   Respiratory: Negative.   Cardiovascular: Negative.   Gastrointestinal: Negative.   Musculoskeletal: Negative.   Neurological: Negative.   Psychiatric/Behavioral: Negative.     Per HPI unless specifically indicated above     Objective:    BP 119/78   Pulse 83   Temp 98.2 F (36.8 C)   Wt 238 lb 6.4 oz (108.1 kg)   SpO2 100%   BMI 40.92 kg/m   Wt Readings from Last 3 Encounters:  12/01/20 238 lb 6.4 oz (108.1 kg)  08/12/20 245 lb 3.2 oz (111.2 kg)  07/01/20 259 lb 9.6 oz (117.8 kg)    Physical Exam Vitals and nursing note reviewed.  Constitutional:      General: She is not in acute distress.    Appearance: Normal appearance. She is obese. She is not ill-appearing, toxic-appearing or diaphoretic.  HENT:     Head: Normocephalic and atraumatic.     Right Ear: External ear normal.     Left Ear: External ear normal.     Nose: Nose normal.     Mouth/Throat:     Mouth: Mucous membranes are moist.     Pharynx: Oropharynx is clear.  Eyes:     General: No scleral icterus.       Right eye: No discharge.        Left eye: No discharge.     Extraocular Movements: Extraocular movements intact.     Conjunctiva/sclera: Conjunctivae normal.     Pupils: Pupils are equal, round, and reactive to light.  Cardiovascular:     Rate and Rhythm: Normal rate and regular rhythm.  Pulses: Normal pulses.     Heart sounds: Normal heart sounds. No murmur heard. No friction rub. No gallop.   Pulmonary:     Effort: Pulmonary effort is normal. No respiratory distress.     Breath sounds: Normal breath sounds. No stridor. No wheezing, rhonchi or rales.  Chest:     Chest wall: No tenderness.  Musculoskeletal:        General: Normal range of motion.     Cervical back: Normal range of motion and neck  supple.  Skin:    General: Skin is warm and dry.     Capillary Refill: Capillary refill takes less than 2 seconds.     Coloration: Skin is not jaundiced or pale.     Findings: No bruising, erythema, lesion or rash.  Neurological:     General: No focal deficit present.     Mental Status: She is alert and oriented to person, place, and time. Mental status is at baseline.  Psychiatric:        Mood and Affect: Mood normal.        Behavior: Behavior normal.        Thought Content: Thought content normal.        Judgment: Judgment normal.     Results for orders placed or performed in visit on 08/12/20  Bayer DCA Hb A1c Waived  Result Value Ref Range   HB A1C (BAYER DCA - WAIVED) 6.9 <7.0 %      Assessment & Plan:   Problem List Items Addressed This Visit      Endocrine   Type 2 diabetes mellitus, uncontrolled, with renal complications (HCC) - Primary    A1c up slightly at 7.2- will increase trulicity to 1.5 and recheck 3 months. Call with any concerns.       Relevant Medications   metFORMIN (GLUCOPHAGE-XR) 500 MG 24 hr tablet   lisinopril (ZESTRIL) 10 MG tablet   empagliflozin (JARDIANCE) 25 MG TABS tablet   atorvastatin (LIPITOR) 80 MG tablet   Dulaglutide (TRULICITY) 1.5 MG/0.5ML SOPN   Other Relevant Orders   CBC with Differential/Platelet   Comprehensive metabolic panel   Urinalysis, Routine w reflex microscopic   Bayer DCA Hb A1c Waived   Hypothyroidism    Rechecking labs today. Await results. Treat as needed.       Relevant Orders   CBC with Differential/Platelet   Comprehensive metabolic panel   TSH   Hyperlipidemia associated with type 2 diabetes mellitus (HCC)    Under good control on current regimen. Continue current regimen. Continue to monitor. Call with any concerns. Refills given. Labs drawn today.       Relevant Medications   metFORMIN (GLUCOPHAGE-XR) 500 MG 24 hr tablet   lisinopril (ZESTRIL) 10 MG tablet   empagliflozin (JARDIANCE) 25 MG TABS  tablet   atorvastatin (LIPITOR) 80 MG tablet   Dulaglutide (TRULICITY) 1.5 MG/0.5ML SOPN   Other Relevant Orders   CBC with Differential/Platelet   Comprehensive metabolic panel   Lipid Panel w/o Chol/HDL Ratio     Genitourinary   Benign hypertensive renal disease    Under good control on current regimen. Continue current regimen. Continue to monitor. Call with any concerns. Refills given. Labs drawn today.        Relevant Orders   CBC with Differential/Platelet   Comprehensive metabolic panel   Microalbumin, Urine Waived   Urinalysis, Routine w reflex microscopic    Other Visit Diagnoses    Dizziness  Resolved now. Checking labs. Let us know if it comes back.    Relevant Orders   VITAMIN D 25 Hydroxy (Vit-D Deficiency, Fractures)       Follow up plan: Return in about 3 months (around 03/03/2021).

## 2020-12-01 NOTE — Assessment & Plan Note (Signed)
A1c up slightly at 7.2- will increase trulicity to 1.5 and recheck 3 months. Call with any concerns.

## 2020-12-02 ENCOUNTER — Other Ambulatory Visit: Payer: Self-pay | Admitting: Family Medicine

## 2020-12-02 LAB — COMPREHENSIVE METABOLIC PANEL WITH GFR
ALT: 31 IU/L (ref 0–32)
AST: 17 IU/L (ref 0–40)
Albumin/Globulin Ratio: 1.9 (ref 1.2–2.2)
Albumin: 4.8 g/dL (ref 3.9–5.0)
Alkaline Phosphatase: 100 IU/L (ref 44–121)
BUN/Creatinine Ratio: 14 (ref 9–23)
BUN: 8 mg/dL (ref 6–20)
Bilirubin Total: 0.4 mg/dL (ref 0.0–1.2)
CO2: 21 mmol/L (ref 20–29)
Calcium: 9.5 mg/dL (ref 8.7–10.2)
Chloride: 100 mmol/L (ref 96–106)
Creatinine, Ser: 0.56 mg/dL — ABNORMAL LOW (ref 0.57–1.00)
Globulin, Total: 2.5 g/dL (ref 1.5–4.5)
Glucose: 151 mg/dL — ABNORMAL HIGH (ref 65–99)
Potassium: 4.3 mmol/L (ref 3.5–5.2)
Sodium: 138 mmol/L (ref 134–144)
Total Protein: 7.3 g/dL (ref 6.0–8.5)
eGFR: 127 mL/min/1.73

## 2020-12-02 LAB — CBC WITH DIFFERENTIAL/PLATELET
Basophils Absolute: 0 10*3/uL (ref 0.0–0.2)
Basos: 0 %
EOS (ABSOLUTE): 0.1 10*3/uL (ref 0.0–0.4)
Eos: 1 %
Hematocrit: 46 % (ref 34.0–46.6)
Hemoglobin: 14.5 g/dL (ref 11.1–15.9)
Immature Grans (Abs): 0 10*3/uL (ref 0.0–0.1)
Immature Granulocytes: 0 %
Lymphocytes Absolute: 3.3 10*3/uL — ABNORMAL HIGH (ref 0.7–3.1)
Lymphs: 36 %
MCH: 25.5 pg — ABNORMAL LOW (ref 26.6–33.0)
MCHC: 31.5 g/dL (ref 31.5–35.7)
MCV: 81 fL (ref 79–97)
Monocytes Absolute: 0.5 10*3/uL (ref 0.1–0.9)
Monocytes: 6 %
Neutrophils Absolute: 5.1 10*3/uL (ref 1.4–7.0)
Neutrophils: 57 %
Platelets: 335 10*3/uL (ref 150–450)
RBC: 5.68 x10E6/uL — ABNORMAL HIGH (ref 3.77–5.28)
RDW: 13.9 % (ref 11.7–15.4)
WBC: 9 10*3/uL (ref 3.4–10.8)

## 2020-12-02 LAB — LIPID PANEL W/O CHOL/HDL RATIO
Cholesterol, Total: 153 mg/dL (ref 100–199)
HDL: 44 mg/dL (ref >39)
LDL Chol Calc (NIH): 79 mg/dL (ref 0–99)
Triglycerides: 176 mg/dL — ABNORMAL HIGH (ref 0–149)
VLDL Cholesterol Cal: 30 mg/dL (ref 5–40)

## 2020-12-02 LAB — VITAMIN D 25 HYDROXY (VIT D DEFICIENCY, FRACTURES): Vit D, 25-Hydroxy: 18 ng/mL — ABNORMAL LOW (ref 30.0–100.0)

## 2020-12-02 LAB — TSH: TSH: 3.01 u[IU]/mL (ref 0.450–4.500)

## 2020-12-02 MED ORDER — VITAMIN D (ERGOCALCIFEROL) 1.25 MG (50000 UNIT) PO CAPS: 50000.0000 [IU] | ORAL_CAPSULE | ORAL | 1 refills | Status: DC

## 2020-12-03 ENCOUNTER — Ambulatory Visit: Payer: Medicaid Other | Admitting: Family Medicine

## 2020-12-11 ENCOUNTER — Ambulatory Visit: Payer: Medicaid Other | Admitting: Family Medicine

## 2020-12-29 ENCOUNTER — Other Ambulatory Visit: Payer: Self-pay | Admitting: Family Medicine

## 2021-01-21 ENCOUNTER — Encounter: Payer: Self-pay | Admitting: Family Medicine

## 2021-01-26 ENCOUNTER — Encounter: Payer: Self-pay | Admitting: Family Medicine

## 2021-01-28 ENCOUNTER — Ambulatory Visit: Payer: Medicaid Other | Admitting: Family Medicine

## 2021-01-30 ENCOUNTER — Ambulatory Visit: Payer: Medicaid Other | Admitting: Family Medicine

## 2021-01-30 ENCOUNTER — Other Ambulatory Visit: Payer: Self-pay

## 2021-01-30 ENCOUNTER — Encounter: Payer: Self-pay | Admitting: Family Medicine

## 2021-01-30 VITALS — BP 103/65 | HR 77 | Temp 98.4°F | Wt 246.0 lb

## 2021-01-30 DIAGNOSIS — E1165 Type 2 diabetes mellitus with hyperglycemia: Secondary | ICD-10-CM | POA: Diagnosis not present

## 2021-01-30 DIAGNOSIS — E1129 Type 2 diabetes mellitus with other diabetic kidney complication: Secondary | ICD-10-CM | POA: Diagnosis not present

## 2021-01-30 DIAGNOSIS — R609 Edema, unspecified: Secondary | ICD-10-CM

## 2021-01-30 DIAGNOSIS — IMO0002 Reserved for concepts with insufficient information to code with codable children: Secondary | ICD-10-CM

## 2021-01-30 MED ORDER — DICLOFENAC SODIUM 1 % EX GEL: 4.0000 g | Freq: Four times a day (QID) | CUTANEOUS | 3 refills | Status: DC

## 2021-01-30 MED ORDER — TRULICITY 3 MG/0.5ML ~~LOC~~ SOAJ: 3.0000 mg | SUBCUTANEOUS | 3 refills | Status: AC

## 2021-01-30 NOTE — Assessment & Plan Note (Signed)
Has not been doing well on her sugars. Will increase her trulicity to 3mg  and recheck A1c in 1 month. Call with any concerns.

## 2021-01-30 NOTE — Progress Notes (Signed)
BP 103/65   Pulse 77   Temp 98.4 F (36.9 C) (Oral)   Wt 246 lb (111.6 kg)   SpO2 99%   BMI 42.23 kg/m    Subjective:    Patient ID: Maria Reyes, female    DOB: March 25, 1991, 30 y.o.   MRN: [id number]  HPI: Maria Reyes is a 30 y.o. female  Chief Complaint  Patient presents with   Joint Swelling    Pt states her L ankle on the back side has been swelling all the way up to her knee for the last week. States the R does this occasionally but the L is worse    Maria Reyes presents today complaining of ankle swelling for the last week. She has been doing better starting today but notes that her R ankle behind her lateral malleolus was swollen and painful starting at the beginning of the week. Her pain was radiating up her R calf and was aching and sore in nature. She has been moving and carrying a lot of things. Swelling is better today as is pain  DIABETES Hypoglycemic episodes:no Polydipsia/polyuria: yes Visual disturbance: no Chest pain: no Paresthesias: yes- in her hand Glucose Monitoring: occasionally  Accucheck frequency: "high" Taking Insulin?: no Blood Pressure Monitoring: not checking Retinal Examination: Not up to Date Foot Exam: Up to Date Diabetic Education: Completed Pneumovax: Up to Date Influenza: Up to Date Aspirin: no  Relevant past medical, surgical, family and social history reviewed and updated as indicated. Interim medical history since our last visit reviewed. Allergies and medications reviewed and updated.  Review of Systems  Constitutional: Negative.   Respiratory: Negative.    Cardiovascular:  Positive for leg swelling. Negative for chest pain and palpitations.  Gastrointestinal: Negative.   Musculoskeletal: Negative.   Psychiatric/Behavioral: Negative.     Per HPI unless specifically indicated above     Objective:    BP 103/65   Pulse 77   Temp 98.4 F (36.9 C) (Oral)   Wt 246 lb (111.6 kg)   SpO2 99%   BMI 42.23 kg/m   Wt Readings  from Last 3 Encounters:  01/30/21 246 lb (111.6 kg)  12/01/20 238 lb 6.4 oz (108.1 kg)  08/12/20 245 lb 3.2 oz (111.2 kg)    Physical Exam Vitals and nursing note reviewed.  Constitutional:      General: She is not in acute distress.    Appearance: Normal appearance. She is not ill-appearing, toxic-appearing or diaphoretic.  HENT:     Head: Normocephalic and atraumatic.     Right Ear: External ear normal.     Left Ear: External ear normal.     Nose: Nose normal.     Mouth/Throat:     Mouth: Mucous membranes are moist.     Pharynx: Oropharynx is clear.  Eyes:     General: No scleral icterus.       Right eye: No discharge.        Left eye: No discharge.     Extraocular Movements: Extraocular movements intact.     Conjunctiva/sclera: Conjunctivae normal.     Pupils: Pupils are equal, round, and reactive to light.  Cardiovascular:     Rate and Rhythm: Normal rate and regular rhythm.     Pulses: Normal pulses.     Heart sounds: Normal heart sounds. No murmur heard.   No friction rub. No gallop.  Pulmonary:     Effort: Pulmonary effort is normal. No respiratory distress.     Breath sounds: Normal  breath sounds. No stridor. No wheezing, rhonchi or rales.  Chest:     Chest wall: No tenderness.  Musculoskeletal:        General: Normal range of motion.     Cervical back: Normal range of motion and neck supple.     Left lower leg: Edema (trace edema around ankle) present.  Skin:    General: Skin is warm and dry.     Capillary Refill: Capillary refill takes less than 2 seconds.     Coloration: Skin is not jaundiced or pale.     Findings: No bruising, erythema, lesion or rash.  Neurological:     General: No focal deficit present.     Mental Status: She is alert and oriented to person, place, and time. Mental status is at baseline.  Psychiatric:        Mood and Affect: Mood normal.        Behavior: Behavior normal.        Thought Content: Thought content normal.        Judgment:  Judgment normal.    Results for orders placed or performed in visit on 12/01/20  CBC with Differential/Platelet  Result Value Ref Range   WBC 9.0 3.4 - 10.8 x10E3/uL   RBC 5.68 (H) 3.77 - 5.28 x10E6/uL   Hemoglobin 14.5 11.1 - 15.9 g/dL   Hematocrit 46.0 34.0 - 46.6 %   MCV 81 79 - 97 fL   MCH 25.5 (L) 26.6 - 33.0 pg   MCHC 31.5 31.5 - 35.7 g/dL   RDW 13.9 11.7 - 15.4 %   Platelets 335 150 - 450 x10E3/uL   Neutrophils 57 Not Estab. %   Lymphs 36 Not Estab. %   Monocytes 6 Not Estab. %   Eos 1 Not Estab. %   Basos 0 Not Estab. %   Neutrophils Absolute 5.1 1.4 - 7.0 x10E3/uL   Lymphocytes Absolute 3.3 (H) 0.7 - 3.1 x10E3/uL   Monocytes Absolute 0.5 0.1 - 0.9 x10E3/uL   EOS (ABSOLUTE) 0.1 0.0 - 0.4 x10E3/uL   Basophils Absolute 0.0 0.0 - 0.2 x10E3/uL   Immature Granulocytes 0 Not Estab. %   Immature Grans (Abs) 0.0 0.0 - 0.1 x10E3/uL  Comprehensive metabolic panel  Result Value Ref Range   Glucose 151 (H) 65 - 99 mg/dL   BUN 8 6 - 20 mg/dL   Creatinine, Ser 0.56 (L) 0.57 - 1.00 mg/dL   eGFR 127 >59 mL/min/1.73   BUN/Creatinine Ratio 14 9 - 23   Sodium 138 134 - 144 mmol/L   Potassium 4.3 3.5 - 5.2 mmol/L   Chloride 100 96 - 106 mmol/L   CO2 21 20 - 29 mmol/L   Calcium 9.5 8.7 - 10.2 mg/dL   Total Protein 7.3 6.0 - 8.5 g/dL   Albumin 4.8 3.9 - 5.0 g/dL   Globulin, Total 2.5 1.5 - 4.5 g/dL   Albumin/Globulin Ratio 1.9 1.2 - 2.2   Bilirubin Total 0.4 0.0 - 1.2 mg/dL   Alkaline Phosphatase 100 44 - 121 IU/L   AST 17 0 - 40 IU/L   ALT 31 0 - 32 IU/L  Lipid Panel w/o Chol/HDL Ratio  Result Value Ref Range   Cholesterol, Total 153 100 - 199 mg/dL   Triglycerides 176 (H) 0 - 149 mg/dL   HDL 44 >39 mg/dL   VLDL Cholesterol Cal 30 5 - 40 mg/dL   LDL Chol Calc (NIH) 79 0 - 99 mg/dL  Microalbumin, Urine Waived  Result Value  Ref Range   Microalb, Ur Waived 30 (H) 0 - 19 mg/L   Creatinine, Urine Waived 50 10 - 300 mg/dL   Microalb/Creat Ratio 30-300 (H) <30 mg/g  TSH   Result Value Ref Range   TSH 3.010 0.450 - 4.500 uIU/mL  VITAMIN D 25 Hydroxy (Vit-D Deficiency, Fractures)  Result Value Ref Range   Vit D, 25-Hydroxy 18.0 (L) 30.0 - 100.0 ng/mL  Urinalysis, Routine w reflex microscopic  Result Value Ref Range   Specific Gravity, UA 1.010 1.005 - 1.030   pH, UA 5.0 5.0 - 7.5   Color, UA Yellow Yellow   Appearance Ur Cloudy (A) Clear   Leukocytes,UA Negative Negative   Protein,UA Negative Negative/Trace   Glucose, UA 3+ (A) Negative   Ketones, UA Negative Negative   RBC, UA Negative Negative   Bilirubin, UA Negative Negative   Urobilinogen, Ur 0.2 0.2 - 1.0 mg/dL   Nitrite, UA Negative Negative  Bayer DCA Hb A1c Waived  Result Value Ref Range   HB A1C (BAYER DCA - WAIVED) 7.3 (H) <7.0 %      Assessment & Plan:   Problem List Items Addressed This Visit       Endocrine   Type 2 diabetes mellitus, uncontrolled, with renal complications (HCC)    Has not been doing well on her sugars. Will increase her trulicity to 17m and recheck A1c in 1 month. Call with any concerns.        Relevant Medications   Dulaglutide (TRULICITY) 3 MQK/[id number]   Other Visit Diagnoses     Peripheral edema    -  Primary   Disccused wearing compression hose and using voltaren for pain PRN. Call with any concerns. Continue to monitor.         Follow up plan: Return As scheduled.

## 2021-02-03 ENCOUNTER — Ambulatory Visit: Payer: Medicaid Other | Admitting: Family Medicine

## 2021-03-03 ENCOUNTER — Ambulatory Visit: Payer: Medicaid Other | Admitting: Family Medicine

## 2021-03-10 ENCOUNTER — Ambulatory Visit: Payer: Medicaid Other | Admitting: Family Medicine

## 2021-04-10 ENCOUNTER — Ambulatory Visit: Payer: Medicaid Other | Admitting: Family Medicine

## 2021-04-30 ENCOUNTER — Other Ambulatory Visit: Payer: Self-pay

## 2021-04-30 ENCOUNTER — Ambulatory Visit: Payer: Medicaid Other | Admitting: Family Medicine

## 2021-04-30 ENCOUNTER — Encounter: Payer: Self-pay | Admitting: Family Medicine

## 2021-04-30 VITALS — BP 115/82 | HR 78 | Ht 64.0 in | Wt 241.0 lb

## 2021-04-30 DIAGNOSIS — E1122 Type 2 diabetes mellitus with diabetic chronic kidney disease: Secondary | ICD-10-CM

## 2021-04-30 DIAGNOSIS — E1169 Type 2 diabetes mellitus with other specified complication: Secondary | ICD-10-CM | POA: Diagnosis not present

## 2021-04-30 DIAGNOSIS — E039 Hypothyroidism, unspecified: Secondary | ICD-10-CM

## 2021-04-30 DIAGNOSIS — I129 Hypertensive chronic kidney disease with stage 1 through stage 4 chronic kidney disease, or unspecified chronic kidney disease: Secondary | ICD-10-CM

## 2021-04-30 DIAGNOSIS — N182 Chronic kidney disease, stage 2 (mild): Secondary | ICD-10-CM

## 2021-04-30 DIAGNOSIS — E785 Hyperlipidemia, unspecified: Secondary | ICD-10-CM

## 2021-04-30 LAB — BAYER DCA HB A1C WAIVED: HB A1C (BAYER DCA - WAIVED): 6.5 % — ABNORMAL HIGH (ref 4.8–5.6)

## 2021-04-30 MED ORDER — METFORMIN HCL ER 500 MG PO TB24: 1000.0000 mg | ORAL_TABLET | Freq: Two times a day (BID) | ORAL | 1 refills | Status: DC

## 2021-04-30 MED ORDER — ATORVASTATIN CALCIUM 80 MG PO TABS: 80.0000 mg | ORAL_TABLET | Freq: Every day | ORAL | 1 refills | Status: DC

## 2021-04-30 MED ORDER — NORETHINDRONE 0.35 MG PO TABS: ORAL_TABLET | ORAL | 12 refills | Status: DC

## 2021-04-30 MED ORDER — EMPAGLIFLOZIN 25 MG PO TABS: ORAL_TABLET | ORAL | 1 refills | Status: DC

## 2021-04-30 MED ORDER — LISINOPRIL 10 MG PO TABS: 10.0000 mg | ORAL_TABLET | Freq: Every day | ORAL | 1 refills | Status: DC

## 2021-04-30 MED ORDER — TRULICITY 3 MG/0.5ML ~~LOC~~ SOAJ: 3.0000 mg | SUBCUTANEOUS | 1 refills | Status: DC

## 2021-04-30 NOTE — Assessment & Plan Note (Signed)
Doing well with A1c of 6.5. Continue current regimen. Continue to monitor. Call with any concerns. Refills given.

## 2021-04-30 NOTE — Assessment & Plan Note (Signed)
Rechecking labs today. Await results. Treat as needed.  °

## 2021-04-30 NOTE — Assessment & Plan Note (Signed)
Would like to see bariatrics. Referral generated today. Continue diet and exercise with goal of losing 1-2lbs per week.

## 2021-04-30 NOTE — Assessment & Plan Note (Signed)
Under good control on current regimen. Continue current regimen. Continue to monitor. Call with any concerns. Refills given. Labs drawn today.   

## 2021-04-30 NOTE — Progress Notes (Signed)
BP 115/82   Pulse 78   Ht 5\' 4"  (1.626 m)   Wt 241 lb (109.3 kg)   BMI 41.37 kg/m    Subjective:    Patient ID: , female    DOB: July 08, 1991, 30 y.o.   MRN: [id number]  HPI: Maria Reyes is a 30 y.o. female  Chief Complaint  Patient presents with   Diabetes   DIABETES Hypoglycemic episodes:no Polydipsia/polyuria: no Visual disturbance: no Chest pain: no Paresthesias: no Glucose Monitoring: yes  Accucheck frequency: Not Checking Taking Insulin?: no Blood Pressure Monitoring: not checking Retinal Examination: Not up to Date Foot Exam: Up to Date Diabetic Education: Completed Pneumovax:  Up to date Influenza:  Declined Aspirin: no  HYPERTENSION / HYPERLIPIDEMIA Satisfied with current treatment? yes Duration of hypertension: chronic BP monitoring frequency: not checking BP medication side effects: no Past BP meds: lisinopril Duration of hyperlipidemia: chronic Cholesterol medication side effects: no Cholesterol supplements: none Past cholesterol medications: atorvastatin Medication compliance: excellent compliance Aspirin: no Recent stressors: no Recurrent headaches: no Visual changes: no Palpitations: no Dyspnea: no Chest pain: no Lower extremity edema: no Dizzy/lightheaded: no  HYPOTHYROIDISM Thyroid control status:controlled Satisfied with current treatment? yes Medication side effects: no Medication compliance: excellent compliance Recent dose adjustment:no Fatigue: no Cold intolerance: no Heat intolerance: no Weight gain: no Weight loss: no Constipation: no Diarrhea/loose stools: no Palpitations: no Lower extremity edema: no Anxiety/depressed mood: no  Relevant past medical, surgical, family and social history reviewed and updated as indicated. Interim medical history since our last visit reviewed. Allergies and medications reviewed and updated.  Review of Systems  Constitutional: Negative.   Respiratory: Negative.     Cardiovascular: Negative.   Gastrointestinal: Negative.   Musculoskeletal: Negative.   Psychiatric/Behavioral: Negative.     Per HPI unless specifically indicated above     Objective:    BP 115/82   Pulse 78   Ht 5\' 4"  (1.626 m)   Wt 241 lb (109.3 kg)   BMI 41.37 kg/m   Wt Readings from Last 3 Encounters:  04/30/21 241 lb (109.3 kg)  01/30/21 246 lb (111.6 kg)  12/01/20 238 lb 6.4 oz (108.1 kg)    Physical Exam Vitals and nursing note reviewed.  Constitutional:      General: She is not in acute distress.    Appearance: Normal appearance. She is obese. She is not ill-appearing, toxic-appearing or diaphoretic.  HENT:     Head: Normocephalic and atraumatic.     Right Ear: External ear normal.     Left Ear: External ear normal.     Nose: Nose normal.     Mouth/Throat:     Mouth: Mucous membranes are moist.     Pharynx: Oropharynx is clear.  Eyes:     General: No scleral icterus.       Right eye: No discharge.        Left eye: No discharge.     Extraocular Movements: Extraocular movements intact.     Conjunctiva/sclera: Conjunctivae normal.     Pupils: Pupils are equal, round, and reactive to light.  Cardiovascular:     Rate and Rhythm: Normal rate and regular rhythm.     Pulses: Normal pulses.     Heart sounds: Normal heart sounds. No murmur heard.   No friction rub. No gallop.  Pulmonary:     Effort: Pulmonary effort is normal. No respiratory distress.     Breath sounds: Normal breath sounds. No stridor. No wheezing, rhonchi or rales.  Chest:  Chest wall: No tenderness.  Musculoskeletal:        General: Normal range of motion.     Cervical back: Normal range of motion and neck supple.  Skin:    General: Skin is warm and dry.     Capillary Refill: Capillary refill takes less than 2 seconds.     Coloration: Skin is not jaundiced or pale.     Findings: No bruising, erythema, lesion or rash.  Neurological:     General: No focal deficit present.     Mental  Status: She is alert and oriented to person, place, and time. Mental status is at baseline.  Psychiatric:        Mood and Affect: Mood normal.        Behavior: Behavior normal.        Thought Content: Thought content normal.        Judgment: Judgment normal.    Results for orders placed or performed in visit on 04/30/21  Bayer DCA Hb A1c Waived  Result Value Ref Range   HB A1C (BAYER DCA - WAIVED) 6.5 (H) 4.8 - 5.6 %      Assessment & Plan:   Problem List Items Addressed This Visit       Endocrine   Type 2 diabetes mellitus with renal complication (HCC)    Doing well with A1c of 6.5. Continue current regimen. Continue to monitor. Call with any concerns. Refills given.       Relevant Medications   atorvastatin (LIPITOR) 80 MG tablet   empagliflozin (JARDIANCE) 25 MG TABS tablet   lisinopril (ZESTRIL) 10 MG tablet   metFORMIN (GLUCOPHAGE-XR) 500 MG 24 hr tablet   Dulaglutide (TRULICITY) 3 MG/0.5ML SOPN   Other Relevant Orders   Bayer DCA Hb A1c Waived (Completed)   Ambulatory referral to Ophthalmology   Hypothyroidism - Primary    Rechecking labs today. Await results. Treat as needed.       Relevant Orders   CBC with Differential/Platelet   Comprehensive metabolic panel   TSH   Hyperlipidemia associated with type 2 diabetes mellitus (HCC)    Under good control on current regimen. Continue current regimen. Continue to monitor. Call with any concerns. Refills given. Labs drawn today.       Relevant Medications   atorvastatin (LIPITOR) 80 MG tablet   empagliflozin (JARDIANCE) 25 MG TABS tablet   lisinopril (ZESTRIL) 10 MG tablet   metFORMIN (GLUCOPHAGE-XR) 500 MG 24 hr tablet   Dulaglutide (TRULICITY) 3 MG/0.5ML SOPN   Other Relevant Orders   CBC with Differential/Platelet   Comprehensive metabolic panel   Lipid Panel w/o Chol/HDL Ratio     Genitourinary   Benign hypertensive renal disease    Under good control on current regimen. Continue current regimen.  Continue to monitor. Call with any concerns. Refills given. Labs drawn today.       Relevant Orders   CBC with Differential/Platelet   Comprehensive metabolic panel     Other   Obesity, Class III, BMI 40-49.9 (morbid obesity) (HCC)    Would like to see bariatrics. Referral generated today. Continue diet and exercise with goal of losing 1-2lbs per week.       Relevant Medications   empagliflozin (JARDIANCE) 25 MG TABS tablet   metFORMIN (GLUCOPHAGE-XR) 500 MG 24 hr tablet   Dulaglutide (TRULICITY) 3 MG/0.5ML SOPN   Other Relevant Orders   Amb Referral to Bariatric Surgery     Follow up plan: Return next few months for physcial  with pap.

## 2021-05-01 ENCOUNTER — Encounter: Payer: Self-pay | Admitting: Family Medicine

## 2021-05-01 LAB — CBC WITH DIFFERENTIAL/PLATELET
Basophils Absolute: 0.1 10*3/uL (ref 0.0–0.2)
Basos: 0 %
EOS (ABSOLUTE): 0.1 10*3/uL (ref 0.0–0.4)
Eos: 1 %
Hematocrit: 46.6 % (ref 34.0–46.6)
Hemoglobin: 15.1 g/dL (ref 11.1–15.9)
Immature Grans (Abs): 0 10*3/uL (ref 0.0–0.1)
Immature Granulocytes: 0 %
Lymphocytes Absolute: 3.5 10*3/uL — ABNORMAL HIGH (ref 0.7–3.1)
Lymphs: 29 %
MCH: 26.7 pg (ref 26.6–33.0)
MCHC: 32.4 g/dL (ref 31.5–35.7)
MCV: 82 fL (ref 79–97)
Monocytes Absolute: 0.6 10*3/uL (ref 0.1–0.9)
Monocytes: 5 %
Neutrophils Absolute: 7.6 10*3/uL — ABNORMAL HIGH (ref 1.4–7.0)
Neutrophils: 65 %
Platelets: 326 10*3/uL (ref 150–450)
RBC: 5.66 x10E6/uL — ABNORMAL HIGH (ref 3.77–5.28)
RDW: 13.3 % (ref 11.7–15.4)
WBC: 11.9 10*3/uL — ABNORMAL HIGH (ref 3.4–10.8)

## 2021-05-01 LAB — COMPREHENSIVE METABOLIC PANEL
ALT: 24 IU/L (ref 0–32)
AST: 13 IU/L (ref 0–40)
Albumin/Globulin Ratio: 1.7 (ref 1.2–2.2)
Albumin: 4.9 g/dL (ref 3.9–5.0)
Alkaline Phosphatase: 98 IU/L (ref 44–121)
BUN/Creatinine Ratio: 19 (ref 9–23)
BUN: 11 mg/dL (ref 6–20)
Bilirubin Total: 0.9 mg/dL (ref 0.0–1.2)
CO2: 19 mmol/L — ABNORMAL LOW (ref 20–29)
Calcium: 10.1 mg/dL (ref 8.7–10.2)
Chloride: 100 mmol/L (ref 96–106)
Creatinine, Ser: 0.57 mg/dL (ref 0.57–1.00)
Globulin, Total: 2.9 g/dL (ref 1.5–4.5)
Glucose: 119 mg/dL — ABNORMAL HIGH (ref 70–99)
Potassium: 4.3 mmol/L (ref 3.5–5.2)
Sodium: 137 mmol/L (ref 134–144)
Total Protein: 7.8 g/dL (ref 6.0–8.5)
eGFR: 127 mL/min/{1.73_m2} (ref >59)

## 2021-05-01 LAB — LIPID PANEL W/O CHOL/HDL RATIO
Cholesterol, Total: 150 mg/dL (ref 100–199)
HDL: 52 mg/dL (ref >39)
LDL Chol Calc (NIH): 74 mg/dL (ref 0–99)
Triglycerides: 139 mg/dL (ref 0–149)
VLDL Cholesterol Cal: 24 mg/dL (ref 5–40)

## 2021-05-01 LAB — TSH: TSH: 3.61 u[IU]/mL (ref 0.450–4.500)

## 2021-05-27 ENCOUNTER — Other Ambulatory Visit: Payer: Self-pay | Admitting: Family Medicine

## 2021-05-27 NOTE — Telephone Encounter (Signed)
Requested medications are due for refill today.  yes  Requested medications are on the active medications list.  yes  Last refill. 12/02/2020  Future visit scheduled.   yes  Notes to clinic.  Medication not delegated. 

## 2021-06-12 ENCOUNTER — Encounter: Payer: Medicaid Other | Admitting: Family Medicine

## 2021-07-02 ENCOUNTER — Other Ambulatory Visit: Payer: Self-pay | Admitting: Family Medicine

## 2021-07-02 NOTE — Telephone Encounter (Signed)
Rx refilled 04/30/2021 #90 with 1 refill. 

## 2021-07-02 NOTE — Telephone Encounter (Signed)
Called [facility] and spoke with [facility] and she confirmed that patient still has one more refill left on her Atorvastatin.

## 2021-07-23 ENCOUNTER — Encounter: Payer: Medicaid Other | Admitting: Family Medicine

## 2021-08-27 ENCOUNTER — Ambulatory Visit: Payer: Medicaid Other | Admitting: Family Medicine

## 2021-08-31 ENCOUNTER — Ambulatory Visit (INDEPENDENT_AMBULATORY_CARE_PROVIDER_SITE_OTHER): Payer: Medicaid Other | Admitting: Family Medicine

## 2021-08-31 ENCOUNTER — Other Ambulatory Visit: Payer: Self-pay

## 2021-08-31 ENCOUNTER — Encounter: Payer: Self-pay | Admitting: Family Medicine

## 2021-08-31 VITALS — BP 105/71 | HR 72 | Temp 98.4°F | Ht 64.0 in | Wt 274.4 lb

## 2021-08-31 DIAGNOSIS — Z0000 Encounter for general adult medical examination without abnormal findings: Secondary | ICD-10-CM | POA: Diagnosis not present

## 2021-08-31 LAB — MICROSCOPIC EXAMINATION: WBC, UA: NONE SEEN /hpf (ref 0–5)

## 2021-08-31 LAB — MICROALBUMIN, URINE WAIVED: Creatinine, Urine Waived: 300 mg/dL (ref 10–300)

## 2021-08-31 LAB — URINALYSIS, ROUTINE W REFLEX MICROSCOPIC
Bilirubin, UA: NEGATIVE
Ketones, UA: NEGATIVE
Leukocytes,UA: NEGATIVE
Nitrite, UA: NEGATIVE
Specific Gravity, UA: >1.03 — ABNORMAL HIGH (ref 1.005–1.030)
Urobilinogen, Ur: 1 mg/dL (ref 0.2–1.0)
pH, UA: 5 (ref 5.0–7.5)

## 2021-08-31 LAB — BAYER DCA HB A1C WAIVED: HB A1C (BAYER DCA - WAIVED): 6.6 % — ABNORMAL HIGH (ref 4.8–5.6)

## 2021-08-31 NOTE — Progress Notes (Signed)
BP 105/71    Pulse 72    Temp 98.4 F (36.9 C)    Ht '5\' 4"'  (1.626 m)    Wt 274 lb 6.4 oz (124.5 kg)    SpO2 98%    BMI 47.10 kg/m    Subjective:    Patient ID: Maria Reyes, female    DOB: July 27, 1990, 31 y.o.   MRN: [id number]  HPI: Maria Reyes is a 31 y.o. female presenting on 08/31/2021 for comprehensive medical examination. Current medical complaints include: Notes that her weight is going up and she has been craving sugar and more hungry.  She currently lives with: Domingo Mend and son Menopausal Symptoms: no  Depression Screen done today and results listed below:  Depression screen [facility] 2/9 08/31/2021 04/30/2021 12/01/2020 05/16/2020 06/13/2019  Decreased Interest 0 0 0 0 0  Down, Depressed, Hopeless 0 0 0 0 0  PHQ - 2 Score 0 0 0 0 0  Altered sleeping '1 1 1 ' 0 1  Tired, decreased energy 0 1 0 0 2  Change in appetite '1 2 1 ' 0 2  Feeling bad or failure about yourself  0 0 0 0 1  Trouble concentrating 0 0 0 0 0  Moving slowly or fidgety/restless 0 0 0 0 0  Suicidal thoughts 0 0 0 0 0  PHQ-9 Score '2 4 2 ' 0 6  Difficult doing work/chores - - - Not difficult at all Not difficult at all  Some recent data might be hidden    Past Medical History:  Past Medical History:  Diagnosis Date   Diabetes mellitus without complication ([place])    Type Two   Hypertension    Hypothyroid    Ovarian cyst     Surgical History:  Past Surgical History:  Procedure Laterality Date   LAPAROSCOPIC OVARIAN CYSTECTOMY      Medications:  Current Outpatient Medications on File Prior to Visit  Medication Sig   atorvastatin (LIPITOR) 80 MG tablet Take 1 tablet (80 mg total) by mouth daily at 6 PM.   COLLAGEN PO Take 1,000 mg by mouth daily.   diclofenac Sodium (VOLTAREN) 1 % GEL Apply 4 g topically 4 (four) times daily.   Dulaglutide (TRULICITY) 3 XA/1.2IN SOPN Inject 3 mg as directed once a week.   empagliflozin (JARDIANCE) 25 MG TABS tablet TAKE 1 TABLET(25 MG) BY MOUTH DAILY BEFORE BREAKFAST    levothyroxine (SYNTHROID) 25 MCG tablet TAKE 1 TABLET BY MOUTH DAILY BEFORE BREAKFAST   lisinopril (ZESTRIL) 10 MG tablet Take 1 tablet (10 mg total) by mouth daily.   metFORMIN (GLUCOPHAGE-XR) 500 MG 24 hr tablet Take 2 tablets (1,000 mg total) by mouth 2 (two) times daily.   norethindrone (NORLYDA) 0.35 MG tablet TAKE 1 TABLET(0.35 MG) BY MOUTH DAILY   Green Tea 250 MG CAPS Take 250 mg by mouth in the morning and at bedtime. (Patient not taking: Reported on 08/31/2021)   No current facility-administered medications on file prior to visit.    Allergies:  No Known Allergies  Social History:  Social History   Socioeconomic History   Marital status: Single    Spouse name: Not on file   Number of children: Not on file   Years of education: Not on file   Highest education level: Not on file  Occupational History   Not on file  Tobacco Use   Smoking status: Never   Smokeless tobacco: Never  Vaping Use   Vaping Use: Never used  Substance and Sexual Activity  Alcohol use: No   Drug use: No   Sexual activity: Yes    Birth control/protection: Pill  Other Topics Concern   Not on file  Social History Narrative   Not on file   Social Determinants of Health   Financial Resource Strain: Not on file  Food Insecurity: Not on file  Transportation Needs: Not on file  Physical Activity: Not on file  Stress: Not on file  Social Connections: Not on file  Intimate Partner Violence: Not on file   Social History   Tobacco Use  Smoking Status Never  Smokeless Tobacco Never   Social History   Substance and Sexual Activity  Alcohol Use No    Family History:  Family History  Problem Relation Age of Onset   Endometriosis Mother    Ovarian cysts Mother    Diabetes Father    Heart disease Father    Diabetes Maternal Grandmother    Hypertension Maternal Grandmother    Cancer Maternal Grandmother        Breast   Diabetes Maternal Grandfather    Diabetes Paternal Grandmother     Diabetes Paternal Grandfather     Past medical history, surgical history, medications, allergies, family history and social history reviewed with patient today and changes made to appropriate areas of the chart.   Review of Systems  Constitutional: Negative.   HENT: Negative.    Eyes: Negative.   Respiratory: Negative.    Cardiovascular: Negative.   Gastrointestinal:  Positive for heartburn. Negative for abdominal pain, blood in stool, constipation, diarrhea, melena, nausea and vomiting.  Genitourinary: Negative.   Musculoskeletal: Negative.   Skin: Negative.   Neurological: Negative.   Endo/Heme/Allergies:  Positive for polydipsia. Negative for environmental allergies. Does not bruise/bleed easily.  Psychiatric/Behavioral: Negative.    All other ROS negative except what is listed above and in the HPI.      Objective:    BP 105/71    Pulse 72    Temp 98.4 F (36.9 C)    Ht '5\' 4"'  (1.626 m)    Wt 274 lb 6.4 oz (124.5 kg)    SpO2 98%    BMI 47.10 kg/m   Wt Readings from Last 3 Encounters:  08/31/21 274 lb 6.4 oz (124.5 kg)  04/30/21 241 lb (109.3 kg)  01/30/21 246 lb (111.6 kg)    Physical Exam Vitals and nursing note reviewed.  Constitutional:      General: She is not in acute distress.    Appearance: Normal appearance. She is obese. She is not ill-appearing, toxic-appearing or diaphoretic.  HENT:     Head: Normocephalic and atraumatic.     Right Ear: Tympanic membrane, ear canal and external ear normal. There is no impacted cerumen.     Left Ear: Tympanic membrane, ear canal and external ear normal. There is no impacted cerumen.     Nose: Nose normal. No congestion or rhinorrhea.     Mouth/Throat:     Mouth: Mucous membranes are moist.     Pharynx: Oropharynx is clear. No oropharyngeal exudate or posterior oropharyngeal erythema.  Eyes:     General: No scleral icterus.       Right eye: No discharge.        Left eye: No discharge.     Extraocular Movements: Extraocular  movements intact.     Conjunctiva/sclera: Conjunctivae normal.     Pupils: Pupils are equal, round, and reactive to light.  Neck:     Vascular: No carotid bruit.  Cardiovascular:     Rate and Rhythm: Normal rate and regular rhythm.     Pulses: Normal pulses.     Heart sounds: Normal heart sounds. No murmur heard.   No friction rub. No gallop.  Pulmonary:     Effort: Pulmonary effort is normal. No respiratory distress.     Breath sounds: Normal breath sounds. No stridor. No wheezing, rhonchi or rales.  Chest:     Chest wall: No tenderness.  Abdominal:     General: Abdomen is flat. Bowel sounds are normal. There is no distension.     Palpations: Abdomen is soft. There is no mass.     Tenderness: There is no abdominal tenderness. There is no right CVA tenderness, left CVA tenderness, guarding or rebound.     Hernia: No hernia is present.  Genitourinary:    Comments: Breast and pelvic exams deferred with shared decision making Musculoskeletal:        General: No swelling, tenderness, deformity or signs of injury. Normal range of motion.     Cervical back: Normal range of motion and neck supple. No rigidity. No muscular tenderness.     Right lower leg: No edema.     Left lower leg: No edema.  Lymphadenopathy:     Cervical: No cervical adenopathy.  Skin:    General: Skin is warm and dry.     Capillary Refill: Capillary refill takes less than 2 seconds.     Coloration: Skin is not jaundiced or pale.     Findings: No bruising, erythema, lesion or rash.  Neurological:     General: No focal deficit present.     Mental Status: She is alert and oriented to person, place, and time. Mental status is at baseline.     Cranial Nerves: No cranial nerve deficit.     Sensory: No sensory deficit.     Motor: No weakness.     Coordination: Coordination normal.     Gait: Gait normal.     Deep Tendon Reflexes: Reflexes normal.  Psychiatric:        Mood and Affect: Mood normal.        Behavior:  Behavior normal.        Thought Content: Thought content normal.        Judgment: Judgment normal.    Results for orders placed or performed in visit on 04/30/21  Bayer DCA Hb A1c Waived  Result Value Ref Range   HB A1C (BAYER DCA - WAIVED) 6.5 (H) 4.8 - 5.6 %  CBC with Differential/Platelet  Result Value Ref Range   WBC 11.9 (H) 3.4 - 10.8 x10E3/uL   RBC 5.66 (H) 3.77 - 5.28 x10E6/uL   Hemoglobin 15.1 11.1 - 15.9 g/dL   Hematocrit 46.6 34.0 - 46.6 %   MCV 82 79 - 97 fL   MCH 26.7 26.6 - 33.0 pg   MCHC 32.4 31.5 - 35.7 g/dL   RDW 13.3 11.7 - 15.4 %   Platelets 326 150 - 450 x10E3/uL   Neutrophils 65 Not Estab. %   Lymphs 29 Not Estab. %   Monocytes 5 Not Estab. %   Eos 1 Not Estab. %   Basos 0 Not Estab. %   Neutrophils Absolute 7.6 (H) 1.4 - 7.0 x10E3/uL   Lymphocytes Absolute 3.5 (H) 0.7 - 3.1 x10E3/uL   Monocytes Absolute 0.6 0.1 - 0.9 x10E3/uL   EOS (ABSOLUTE) 0.1 0.0 - 0.4 x10E3/uL   Basophils Absolute 0.1 0.0 - 0.2 x10E3/uL  Immature Granulocytes 0 Not Estab. %   Immature Grans (Abs) 0.0 0.0 - 0.1 x10E3/uL  Comprehensive metabolic panel  Result Value Ref Range   Glucose 119 (H) 70 - 99 mg/dL   BUN 11 6 - 20 mg/dL   Creatinine, Ser 0.57 0.57 - 1.00 mg/dL   eGFR 127 >59 mL/min/1.73   BUN/Creatinine Ratio 19 9 - 23   Sodium 137 134 - 144 mmol/L   Potassium 4.3 3.5 - 5.2 mmol/L   Chloride 100 96 - 106 mmol/L   CO2 19 (L) 20 - 29 mmol/L   Calcium 10.1 8.7 - 10.2 mg/dL   Total Protein 7.8 6.0 - 8.5 g/dL   Albumin 4.9 3.9 - 5.0 g/dL   Globulin, Total 2.9 1.5 - 4.5 g/dL   Albumin/Globulin Ratio 1.7 1.2 - 2.2   Bilirubin Total 0.9 0.0 - 1.2 mg/dL   Alkaline Phosphatase 98 44 - 121 IU/L   AST 13 0 - 40 IU/L   ALT 24 0 - 32 IU/L  Lipid Panel w/o Chol/HDL Ratio  Result Value Ref Range   Cholesterol, Total 150 100 - 199 mg/dL   Triglycerides 139 0 - 149 mg/dL   HDL 52 >39 mg/dL   VLDL Cholesterol Cal 24 5 - 40 mg/dL   LDL Chol Calc (NIH) 74 0 - 99 mg/dL  TSH   Result Value Ref Range   TSH 3.610 0.450 - 4.500 uIU/mL      Assessment & Plan:   Problem List Items Addressed This Visit   None Visit Diagnoses     Routine general medical examination at a health care facility    -  Primary   Vaccines up to date/declined. Screening labs checked today. Will return for pap. Continue diet and exercise. Call with any concerns.    Relevant Orders   CBC with Differential/Platelet   Comprehensive metabolic panel   Lipid Panel w/o Chol/HDL Ratio   Urinalysis, Routine w reflex microscopic   TSH   Bayer DCA Hb A1c Waived   Microalbumin, Urine Waived        Follow up plan: Return 2 weeks pap, 3 months follow up.   LABORATORY TESTING:  - Pap smear:  Will return, on menses  IMMUNIZATIONS:   - Tdap: Tetanus vaccination status reviewed: last tetanus booster within 10 years. - Influenza: Refused - Pneumovax: Up to date - Prevnar: Not applicable - COVID: Refused - HPV: Refused - Shingrix vaccine: Not applicable  PATIENT COUNSELING:   Advised to take 1 mg of folate supplement per day if capable of pregnancy.   Sexuality: Discussed sexually transmitted diseases, partner selection, use of condoms, avoidance of unintended pregnancy  and contraceptive alternatives.   Advised to avoid cigarette smoking.  I discussed with the patient that most people either abstain from alcohol or drink within safe limits (<=14/week and <=4 drinks/occasion for males, <=7/weeks and <= 3 drinks/occasion for females) and that the risk for alcohol disorders and other health effects rises proportionally with the number of drinks per week and how often a drinker exceeds daily limits.  Discussed cessation/primary prevention of drug use and availability of treatment for abuse.   Diet: Encouraged to adjust caloric intake to maintain  or achieve ideal body weight, to reduce intake of dietary saturated fat and total fat, to limit sodium intake by avoiding high sodium foods and not  adding table salt, and to maintain adequate dietary potassium and calcium preferably from fresh fruits, vegetables, and low-fat dairy products.  stressed the importance of regular exercise  Injury prevention: Discussed safety belts, safety helmets, smoke detector, smoking near bedding or upholstery.   Dental health: Discussed importance of regular tooth brushing, flossing, and dental visits.    NEXT PREVENTATIVE PHYSICAL DUE IN 1 YEAR. Return 2 weeks pap, 3 months follow up.

## 2021-09-01 LAB — CBC WITH DIFFERENTIAL/PLATELET
Basophils Absolute: 0 10*3/uL (ref 0.0–0.2)
Basos: 1 %
EOS (ABSOLUTE): 0.1 10*3/uL (ref 0.0–0.4)
Eos: 1 %
Hematocrit: 43.1 % (ref 34.0–46.6)
Hemoglobin: 14 g/dL (ref 11.1–15.9)
Immature Grans (Abs): 0 10*3/uL (ref 0.0–0.1)
Immature Granulocytes: 0 %
Lymphocytes Absolute: 2.9 10*3/uL (ref 0.7–3.1)
Lymphs: 33 %
MCH: 26.9 pg (ref 26.6–33.0)
MCHC: 32.5 g/dL (ref 31.5–35.7)
MCV: 83 fL (ref 79–97)
Monocytes Absolute: 0.4 10*3/uL (ref 0.1–0.9)
Monocytes: 5 %
Neutrophils Absolute: 5.2 10*3/uL (ref 1.4–7.0)
Neutrophils: 60 %
Platelets: 323 10*3/uL (ref 150–450)
RBC: 5.2 x10E6/uL (ref 3.77–5.28)
RDW: 13.1 % (ref 11.7–15.4)
WBC: 8.7 10*3/uL (ref 3.4–10.8)

## 2021-09-01 LAB — COMPREHENSIVE METABOLIC PANEL
ALT: 31 IU/L (ref 0–32)
AST: 20 IU/L (ref 0–40)
Albumin/Globulin Ratio: 1.7 (ref 1.2–2.2)
Albumin: 4.6 g/dL (ref 3.9–5.0)
Alkaline Phosphatase: 80 IU/L (ref 44–121)
BUN/Creatinine Ratio: 17 (ref 9–23)
BUN: 9 mg/dL (ref 6–20)
Bilirubin Total: 0.5 mg/dL (ref 0.0–1.2)
CO2: 22 mmol/L (ref 20–29)
Calcium: 9.3 mg/dL (ref 8.7–10.2)
Chloride: 104 mmol/L (ref 96–106)
Creatinine, Ser: 0.54 mg/dL — ABNORMAL LOW (ref 0.57–1.00)
Globulin, Total: 2.7 g/dL (ref 1.5–4.5)
Glucose: 109 mg/dL — ABNORMAL HIGH (ref 70–99)
Potassium: 3.9 mmol/L (ref 3.5–5.2)
Sodium: 140 mmol/L (ref 134–144)
Total Protein: 7.3 g/dL (ref 6.0–8.5)
eGFR: 129 mL/min/{1.73_m2} (ref >59)

## 2021-09-01 LAB — LIPID PANEL W/O CHOL/HDL RATIO
Cholesterol, Total: 154 mg/dL (ref 100–199)
HDL: 47 mg/dL (ref >39)
LDL Chol Calc (NIH): 89 mg/dL (ref 0–99)
Triglycerides: 98 mg/dL (ref 0–149)
VLDL Cholesterol Cal: 18 mg/dL (ref 5–40)

## 2021-09-01 LAB — TSH: TSH: 1.88 u[IU]/mL (ref 0.450–4.500)

## 2021-09-14 ENCOUNTER — Encounter: Payer: Self-pay | Admitting: Family Medicine

## 2021-09-14 ENCOUNTER — Ambulatory Visit: Payer: Medicaid Other | Admitting: Family Medicine

## 2021-09-14 ENCOUNTER — Other Ambulatory Visit (HOSPITAL_COMMUNITY)
Admission: RE | Admit: 2021-09-14 | Discharge: 2021-09-14 | Disposition: A | Discharge status: Home / Self Care | Payer: Medicaid Other | Source: Ambulatory Visit | Attending: Family Medicine | Admitting: Family Medicine

## 2021-09-14 ENCOUNTER — Other Ambulatory Visit: Payer: Self-pay

## 2021-09-14 VITALS — BP 104/72 | HR 75 | Temp 98.3°F | Wt 249.4 lb

## 2021-09-14 DIAGNOSIS — Z124 Encounter for screening for malignant neoplasm of cervix: Secondary | ICD-10-CM | POA: Insufficient documentation

## 2021-09-14 DIAGNOSIS — Z01419 Encounter for gynecological examination (general) (routine) without abnormal findings: Secondary | ICD-10-CM | POA: Diagnosis not present

## 2021-09-14 NOTE — Progress Notes (Signed)
? ?BP 104/72   Pulse 75   Temp 98.3 ?F (36.8 ?C)   Wt 249 lb 6.4 oz (113.1 kg)   SpO2 98%   BMI 42.81 kg/m?   ? ?Subjective:  ? ? Patient ID: Maria Reyes, female    DOB: 14-Jul-1990, 31 y.o.   MRN: [id number] ? ?HPI: ?Maria Reyes is a 31 y.o. female ? ?Chief Complaint  ?Patient presents with  ? Gynecologic Exam  ? ?Feeling well. No concerns. Needs Pap and breast exam- was on her cycle when here for her physical.  ? ?Relevant past medical, surgical, family and social history reviewed and updated as indicated. Interim medical history since our last visit reviewed. ?Allergies and medications reviewed and updated. ? ?Review of Systems  ?Constitutional: Negative.   ?Respiratory: Negative.    ?Cardiovascular: Negative.   ?Gastrointestinal: Negative.   ?Genitourinary: Negative.   ?Psychiatric/Behavioral: Negative.    ? ?Per HPI unless specifically indicated above ? ?   ?Objective:  ?  ?BP 104/72   Pulse 75   Temp 98.3 ?F (36.8 ?C)   Wt 249 lb 6.4 oz (113.1 kg)   SpO2 98%   BMI 42.81 kg/m?   ?Wt Readings from Last 3 Encounters:  ?09/14/21 249 lb 6.4 oz (113.1 kg)  ?08/31/21 274 lb 6.4 oz (124.5 kg)  ?04/30/21 241 lb (109.3 kg)  ?  ?Physical Exam ?Vitals and nursing note reviewed. Exam conducted with a chaperone present.  ?Constitutional:   ?   General: She is not in acute distress. ?   Appearance: Normal appearance. She is well-developed.  ?HENT:  ?   Head: Normocephalic and atraumatic.  ?   Right Ear: Hearing and external ear normal.  ?   Left Ear: Hearing and external ear normal.  ?   Nose: Nose normal.  ?   Mouth/Throat:  ?   Mouth: Mucous membranes are moist.  ?   Pharynx: Oropharynx is clear.  ?Eyes:  ?   General: Lids are normal. No scleral icterus.    ?   Right eye: No discharge.     ?   Left eye: No discharge.  ?   Conjunctiva/sclera: Conjunctivae normal.  ?Pulmonary:  ?   Effort: Pulmonary effort is normal. No respiratory distress.  ?Chest:  ?Breasts: ?   Right: Normal.  ?   Left: Normal.   ?Genitourinary: ?   Labia:     ?   Right: No rash, tenderness, lesion or injury.     ?   Left: No rash, tenderness, lesion or injury.   ?   Vagina: Normal.  ?   Cervix: Normal.  ?   Uterus: Normal.   ?   Adnexa: Right adnexa normal and left adnexa normal.  ?Musculoskeletal:     ?   General: Normal range of motion.  ?Skin: ?   Coloration: Skin is not jaundiced or pale.  ?   Findings: No bruising, erythema, lesion or rash.  ?Neurological:  ?   General: No focal deficit present.  ?   Mental Status: She is alert and oriented to person, place, and time. Mental status is at baseline.  ?Psychiatric:     ?   Mood and Affect: Mood normal.     ?   Speech: Speech normal.     ?   Behavior: Behavior normal.     ?   Thought Content: Thought content normal.     ?   Judgment: Judgment normal.  ? ? ?Results for orders placed  or performed in visit on 08/31/21  ?Microscopic Examination  ? Urine  ?Result Value Ref Range  ? WBC, UA None seen 0 - 5 /hpf  ? RBC 11-30 (A) 0 - 2 /hpf  ? Epithelial Cells (non renal) 0-10 0 - 10 /hpf  ? Bacteria, UA Few (A) None seen/Few  ?CBC with Differential/Platelet  ?Result Value Ref Range  ? WBC 8.7 3.4 - 10.8 x10E3/uL  ? RBC 5.20 3.77 - 5.28 x10E6/uL  ? Hemoglobin 14.0 11.1 - 15.9 g/dL  ? Hematocrit 43.1 34.0 - 46.6 %  ? MCV 83 79 - 97 fL  ? MCH 26.9 26.6 - 33.0 pg  ? MCHC 32.5 31.5 - 35.7 g/dL  ? RDW 13.1 11.7 - 15.4 %  ? Platelets 323 150 - 450 x10E3/uL  ? Neutrophils 60 Not Estab. %  ? Lymphs 33 Not Estab. %  ? Monocytes 5 Not Estab. %  ? Eos 1 Not Estab. %  ? Basos 1 Not Estab. %  ? Neutrophils Absolute 5.2 1.4 - 7.0 x10E3/uL  ? Lymphocytes Absolute 2.9 0.7 - 3.1 x10E3/uL  ? Monocytes Absolute 0.4 0.1 - 0.9 x10E3/uL  ? EOS (ABSOLUTE) 0.1 0.0 - 0.4 x10E3/uL  ? Basophils Absolute 0.0 0.0 - 0.2 x10E3/uL  ? Immature Granulocytes 0 Not Estab. %  ? Immature Grans (Abs) 0.0 0.0 - 0.1 x10E3/uL  ?Comprehensive metabolic panel  ?Result Value Ref Range  ? Glucose 109 (H) 70 - 99 mg/dL  ? BUN 9 6 - 20 mg/dL   ? Creatinine, Ser 0.54 (L) 0.57 - 1.00 mg/dL  ? eGFR 129 >59 mL/min/1.73  ? BUN/Creatinine Ratio 17 9 - 23  ? Sodium 140 134 - 144 mmol/L  ? Potassium 3.9 3.5 - 5.2 mmol/L  ? Chloride 104 96 - 106 mmol/L  ? CO2 22 20 - 29 mmol/L  ? Calcium 9.3 8.7 - 10.2 mg/dL  ? Total Protein 7.3 6.0 - 8.5 g/dL  ? Albumin 4.6 3.9 - 5.0 g/dL  ? Globulin, Total 2.7 1.5 - 4.5 g/dL  ? Albumin/Globulin Ratio 1.7 1.2 - 2.2  ? Bilirubin Total 0.5 0.0 - 1.2 mg/dL  ? Alkaline Phosphatase 80 44 - 121 IU/L  ? AST 20 0 - 40 IU/L  ? ALT 31 0 - 32 IU/L  ?Lipid Panel w/o Chol/HDL Ratio  ?Result Value Ref Range  ? Cholesterol, Total 154 100 - 199 mg/dL  ? Triglycerides 98 0 - 149 mg/dL  ? HDL 47 >39 mg/dL  ? VLDL Cholesterol Cal 18 5 - 40 mg/dL  ? LDL Chol Calc (NIH) 89 0 - 99 mg/dL  ?Urinalysis, Routine w reflex microscopic  ?Result Value Ref Range  ? Specific Gravity, UA >1.030 (H) 1.005 - 1.030  ? pH, UA 5.0 5.0 - 7.5  ? Color, UA Red (A) Yellow  ? Appearance Ur Cloudy (A) Clear  ? Leukocytes,UA Negative Negative  ? Protein,UA 3+ (A) Negative/Trace  ? Glucose, UA 3+ (A) Negative  ? Ketones, UA Negative Negative  ? RBC, UA 3+ (A) Negative  ? Bilirubin, UA Negative Negative  ? Urobilinogen, Ur 1.0 0.2 - 1.0 mg/dL  ? Nitrite, UA Negative Negative  ? Microscopic Examination See below:   ?TSH  ?Result Value Ref Range  ? TSH 1.880 0.450 - 4.500 uIU/mL  ?Bayer DCA Hb A1c Waived  ?Result Value Ref Range  ? HB A1C (BAYER DCA - WAIVED) 6.6 (H) 4.8 - 5.6 %  ?Microalbumin, Urine Waived  ?Result Value Ref Range  ?  Microalb, Ur Waived CANCELED   ? Creatinine, Urine Waived 300 10 - 300 mg/dL  ? Microalb/Creat Ratio 30-300 (H) <30 mg/g  ? ?   ?Assessment & Plan:  ? ?Problem List Items Addressed This Visit   ?None ?Visit Diagnoses   ? ? Encounter for gynecological examination without abnormal finding    -  Primary  ? Pap done. Continue to monitor. Call with any concerns.   ? Screening for cervical cancer      ? Pap done today.  ? Relevant Orders  ? Cytology -  PAP  ? ?  ?  ? ?Follow up plan: ?Return As scheduled. ? ? ? ? ? ?

## 2021-09-16 LAB — CYTOLOGY - PAP: Diagnosis: NEGATIVE

## 2021-10-03 ENCOUNTER — Other Ambulatory Visit: Payer: Self-pay | Admitting: Family Medicine

## 2021-10-05 NOTE — Telephone Encounter (Signed)
Requested Prescriptions  ?Pending Prescriptions Disp Refills  ?? levothyroxine (SYNTHROID) 25 MCG tablet [Pharmacy Med Name: LEVOTHYROXINE 0.025MG  (25MCG) TAB] 90 tablet 3  ?  Sig: TAKE 1 TABLET BY MOUTH DAILY BEFORE AND BREAKFAST  ?  ? Endocrinology:  Hypothyroid Agents Passed - 10/03/2021  3:34 AM  ?  ?  Passed - TSH in normal range and within 360 days  ?  TSH  ?Date Value Ref Range Status  ?08/31/2021 1.880 0.450 - 4.500 uIU/mL Final  ?   ?  ?  Passed - Valid encounter within last 12 months  ?  Recent Outpatient Visits   ?      ? 3 weeks ago Encounter for gynecological examination without abnormal finding  ? [place], DO  ? 1 month ago Routine general medical examination at a health care facility  ? [place] P, DO  ? 5 months ago Hypothyroidism, unspecified type  ? [place] P, DO  ? 8 months ago Peripheral edema  ? [place] P, DO  ? 10 months ago Type 2 diabetes mellitus, uncontrolled, with renal complications ([place])  ? [place], [place] P, DO  ?  ?  ?Future Appointments   ?        ? In 1 month Johnson, Megan P, DO [facility]  ?  ? ?  ?  ?  ?? atorvastatin (LIPITOR) 80 MG tablet [Pharmacy Med Name: ATORVASTATIN 80MG  TABLETS] 90 tablet 3  ?  Sig: TAKE 1 TABLET(80 MG) BY MOUTH DAILY AT 6 PM  ?  ? Cardiovascular:  Antilipid - Statins Failed - 10/03/2021  3:34 AM  ?  ?  Failed - Lipid Panel in normal range within the last 12 months  ?  Cholesterol, Total  ?Date Value Ref Range Status  ?08/31/2021 154 100 - 199 mg/dL Final  ? ?LDL Chol Calc (NIH)  ?Date Value Ref Range Status  ?08/31/2021 89 0 - 99 mg/dL Final  ? ?HDL  ?Date Value Ref Range Status  ?08/31/2021 47 >39 mg/dL Final  ? ?Triglycerides  ?Date Value Ref Range Status  ?08/31/2021 98 0 - 149 mg/dL Final  ? ?  ?  ?  Passed - Patient is not pregnant  ?  ?  Passed - Valid encounter within last 12  months  ?  Recent Outpatient Visits   ?      ? 3 weeks ago Encounter for gynecological examination without abnormal finding  ? [place], DO  ? 1 month ago Routine general medical examination at a health care facility  ? [place] P, DO  ? 5 months ago Hypothyroidism, unspecified type  ? [place] P, DO  ? 8 months ago Peripheral edema  ? [place] P, DO  ? 10 months ago Type 2 diabetes mellitus, uncontrolled, with renal complications ([place])  ? [place], [place] P, DO  ?  ?  ?Future Appointments   ?        ? In 1 month Johnson, Barb Merino, DO [facility]  ?  ? ?  ?  ?  ? ?

## 2021-10-24 ENCOUNTER — Other Ambulatory Visit: Payer: Self-pay | Admitting: Family Medicine

## 2021-10-26 NOTE — Telephone Encounter (Signed)
Requested Prescriptions  ?Pending Prescriptions Disp Refills  ?? metFORMIN (GLUCOPHAGE-XR) 500 MG 24 hr tablet [Pharmacy Med Name: METFORMIN ER 500MG 24HR TABS] 360 tablet 1  ?  Sig: TAKE 2 TABLETS(1000 MG) BY MOUTH TWICE DAILY  ?  ? Endocrinology:  Diabetes - Biguanides Failed - 10/24/2021  9:28 AM  ?  ?  Failed - Cr in normal range and within 360 days  ?  Creatinine, Ser  ?Date Value Ref Range Status  ?08/31/2021 0.54 (L) 0.57 - 1.00 mg/dL Final  ?   ?  ?  Failed - B12 Level in normal range and within 720 days  ?  No results found for: VITAMINB12   ?  ?  Passed - HBA1C is between 0 and 7.9 and within 180 days  ?  HB A1C (BAYER DCA - WAIVED)  ?Date Value Ref Range Status  ?08/31/2021 6.6 (H) 4.8 - 5.6 % Final  ?  Comment:  ?           Prediabetes: 5.7 - 6.4 ?         Diabetes: >6.4 ?         Glycemic control for adults with diabetes: <7.0 ?  ?   ?  ?  Passed - eGFR in normal range and within 360 days  ?  GFR calc Af Amer  ?Date Value Ref Range Status  ?07/01/2020 153 >59 mL/min/1.73 Final  ?  Comment:  ?  **In accordance with recommendations from the NKF-ASN Task force,** ?  [facility] is in the process of updating its eGFR calculation to the ?  2021 CKD-EPI creatinine equation that estimates kidney function ?  without a race variable. ?  ? ?GFR calc non Af Amer  ?Date Value Ref Range Status  ?07/01/2020 133 >59 mL/min/1.73 Final  ? ?eGFR  ?Date Value Ref Range Status  ?08/31/2021 129 >59 mL/min/1.73 Final  ?   ?  ?  Passed - Valid encounter within last 6 months  ?  Recent Outpatient Visits   ?      ? 1 month ago Encounter for gynecological examination without abnormal finding  ? [place], DO  ? 1 month ago Routine general medical examination at a health care facility  ? [place] P, DO  ? 5 months ago Hypothyroidism, unspecified type  ? [place] P, DO  ? 8 months ago Peripheral edema  ? [place]  P, DO  ? 10 months ago Type 2 diabetes mellitus, uncontrolled, with renal complications ([place])  ? [place], [place] P, DO  ?  ?  ?Future Appointments   ?        ? In 1 month Johnson, Megan P, DO [facility]  ?  ? ?  ?  ?  Passed - CBC within normal limits and completed in the last 12 months  ?  WBC  ?Date Value Ref Range Status  ?08/31/2021 8.7 3.4 - 10.8 x10E3/uL Final  ?11/30/2016 14.9 (H) 3.6 - 11.0 K/uL Final  ? ?RBC  ?Date Value Ref Range Status  ?08/31/2021 5.20 3.77 - 5.28 x10E6/uL Final  ?11/30/2016 4.86 3.80 - 5.20 MIL/uL Final  ? ?Hemoglobin  ?Date Value Ref Range Status  ?08/31/2021 14.0 11.1 - 15.9 g/dL Final  ? ?Hematocrit  ?Date Value Ref Range Status  ?08/31/2021 43.1 34.0 - 46.6 % Final  ? ?MCHC  ?Date Value Ref Range  Status  ?08/31/2021 32.5 31.5 - 35.7 g/dL Final  ?11/30/2016 32.5 32.0 - 36.0 g/dL Final  ? ?MCH  ?Date Value Ref Range Status  ?08/31/2021 26.9 26.6 - 33.0 pg Final  ?11/30/2016 25.4 (L) 26.0 - 34.0 pg Final  ? ?MCV  ?Date Value Ref Range Status  ?08/31/2021 83 79 - 97 fL Final  ? ?No results found for: PLTCOUNTKUC, LABPLAT, [place] ?RDW  ?Date Value Ref Range Status  ?08/31/2021 13.1 11.7 - 15.4 % Final  ? ?  ?  ?  ?? lisinopril (ZESTRIL) 10 MG tablet [Pharmacy Med Name: LISINOPRIL 10MG TABLETS] 90 tablet 1  ?  Sig: TAKE 1 TABLET(10 MG) BY MOUTH DAILY  ?  ? Cardiovascular:  ACE Inhibitors Failed - 10/24/2021  9:28 AM  ?  ?  Failed - Cr in normal range and within 180 days  ?  Creatinine, Ser  ?Date Value Ref Range Status  ?08/31/2021 0.54 (L) 0.57 - 1.00 mg/dL Final  ?   ?  ?  Passed - K in normal range and within 180 days  ?  Potassium  ?Date Value Ref Range Status  ?08/31/2021 3.9 3.5 - 5.2 mmol/L Final  ?   ?  ?  Passed - Patient is not pregnant  ?  ?  Passed - Last BP in normal range  ?  BP Readings from Last 1 Encounters:  ?09/14/21 104/72  ?   ?  ?  Passed - Valid encounter within last 6 months  ?  Recent Outpatient Visits   ?      ? 1 month  ago Encounter for gynecological examination without abnormal finding  ? [place], DO  ? 1 month ago Routine general medical examination at a health care facility  ? [place] P, DO  ? 5 months ago Hypothyroidism, unspecified type  ? [place] P, DO  ? 8 months ago Peripheral edema  ? [place] P, DO  ? 10 months ago Type 2 diabetes mellitus, uncontrolled, with renal complications ([place])  ? [place], [place] P, DO  ?  ?  ?Future Appointments   ?        ? In 1 month Johnson, Megan P, DO [facility]  ?  ? ?  ?  ?  ?? JARDIANCE 25 MG TABS tablet [Pharmacy Med Name: JARDIANCE 25MG TABLETS] 90 tablet 1  ?  Sig: TAKE 1 TABLET(25 MG) BY MOUTH DAILY BEFORE BREAKFAST  ?  ? Endocrinology:  Diabetes - SGLT2 Inhibitors Failed - 10/24/2021  9:28 AM  ?  ?  Failed - Cr in normal range and within 360 days  ?  Creatinine, Ser  ?Date Value Ref Range Status  ?08/31/2021 0.54 (L) 0.57 - 1.00 mg/dL Final  ?   ?  ?  Passed - HBA1C is between 0 and 7.9 and within 180 days  ?  HB A1C (BAYER DCA - WAIVED)  ?Date Value Ref Range Status  ?08/31/2021 6.6 (H) 4.8 - 5.6 % Final  ?  Comment:  ?           Prediabetes: 5.7 - 6.4 ?         Diabetes: >6.4 ?         Glycemic control for adults with diabetes: <7.0 ?  ?   ?  ?  Passed - eGFR in normal range and within 360 days  ?  GFR calc Af Amer  ?Date Value Ref Range Status  ?07/01/2020 153 >59 mL/min/1.73 Final  ?  Comment:  ?  **In accordance with recommendations from the NKF-ASN Task force,** ?  [facility] is in the process of updating its eGFR calculation to the ?  2021 CKD-EPI creatinine equation that estimates kidney function ?  without a race variable. ?  ? ?GFR calc non Af Amer  ?Date Value Ref Range Status  ?07/01/2020 133 >59 mL/min/1.73 Final  ? ?eGFR  ?Date Value Ref Range Status  ?08/31/2021 129 >59 mL/min/1.73 Final  ?   ?  ?   Passed - Valid encounter within last 6 months  ?  Recent Outpatient Visits   ?      ? 1 month ago Encounter for gynecological examination without abnormal finding  ? [place], DO  ? 1 month ago Routine general medical examination at a health care facility  ? [place] P, DO  ? 5 months ago Hypothyroidism, unspecified type  ? [place] P, DO  ? 8 months ago Peripheral edema  ? [place] P, DO  ? 10 months ago Type 2 diabetes mellitus, uncontrolled, with renal complications ([place])  ? [place], [place] P, DO  ?  ?  ?Future Appointments   ?        ? In 1 month Johnson, Barb Merino, DO [facility]  ?  ? ?  ?  ?  ? ? ?

## 2021-10-30 ENCOUNTER — Other Ambulatory Visit: Payer: Self-pay | Admitting: Family Medicine

## 2021-10-30 NOTE — Telephone Encounter (Signed)
Requested Prescriptions  ?Pending Prescriptions Disp Refills  ?? TRULICITY 3 0000000 SOPN [Pharmacy Med Name: TRULICITY 3MG /0.5ML SDP 0.5ML] 6 mL 0  ?  Sig: ADMINISTER 3MG  UNDER THE SKIN 1 TIME A WEEK AS DIRECTED  ?  ? Endocrinology:  Diabetes - GLP-1 Receptor Agonists Passed - 10/30/2021  3:34 AM  ?  ?  Passed - HBA1C is between 0 and 7.9 and within 180 days  ?  HB A1C (BAYER DCA - WAIVED)  ?Date Value Ref Range Status  ?08/31/2021 6.6 (H) 4.8 - 5.6 % Final  ?  Comment:  ?           Prediabetes: 5.7 - 6.4 ?         Diabetes: >6.4 ?         Glycemic control for adults with diabetes: <7.0 ?  ?   ?  ?  Passed - Valid encounter within last 6 months  ?  Recent Outpatient Visits   ?      ? 1 month ago Encounter for gynecological examination without abnormal finding  ? [place], DO  ? 2 months ago Routine general medical examination at a health care facility  ? [place] P, DO  ? 6 months ago Hypothyroidism, unspecified type  ? [place] P, DO  ? 9 months ago Peripheral edema  ? [place], Megan P, DO  ? 11 months ago Type 2 diabetes mellitus, uncontrolled, with renal complications ([place])  ? [place], [place] P, DO  ?  ?  ?Future Appointments   ?        ? In 1 month Johnson, Barb Merino, DO [facility]  ?  ? ?  ?  ?  ? ? ?

## 2021-11-30 ENCOUNTER — Ambulatory Visit: Payer: Medicaid Other | Admitting: Family Medicine

## 2022-05-13 ENCOUNTER — Other Ambulatory Visit: Payer: Self-pay | Admitting: Family Medicine

## 2022-05-13 NOTE — Telephone Encounter (Signed)
appt

## 2022-05-13 NOTE — Telephone Encounter (Signed)
Requested medication (s) are due for refill today - expired Rx  Requested medication (s) are on the active medication list -yes  Future visit scheduled -no  Last refill: 04/30/21 #28 12RF  Notes to clinic: expired Rx  Requested Prescriptions  Pending Prescriptions Disp Refills   HEATHER 0.35 MG tablet [Pharmacy Med Name: HEATHER 0.35MG  TABLETS 28S] 28 tablet 12    Sig: TAKE 1 TABLET(0.35 MG) BY MOUTH DAILY     OB/GYN: Contraceptives - Progestins Passed - 05/13/2022  1:55 PM      Passed - Last BP in normal range    BP Readings from Last 1 Encounters:  09/14/21 104/72         Passed - Valid encounter within last 12 months    Recent Outpatient Visits           8 months ago Encounter for gynecological examination without abnormal finding   [employer], Megan P, DO   8 months ago Routine general medical examination at a health care facility   [facility], [place], DO   1 year ago Hypothyroidism, unspecified type   [place], [place], DO   1 year ago Peripheral edema   [place], [place], DO   1 year ago Type 2 diabetes mellitus, uncontrolled, with renal complications ([place])   [place], [place], DO              Passed - Patient is not a smoker         Requested Prescriptions  Pending Prescriptions Disp Refills   HEATHER 0.35 MG tablet [Pharmacy Med Name: HEATHER 0.35MG  TABLETS 28S] 28 tablet 12    Sig: TAKE 1 TABLET(0.35 MG) BY MOUTH DAILY     OB/GYN: Contraceptives - Progestins Passed - 05/13/2022  1:55 PM      Passed - Last BP in normal range    BP Readings from Last 1 Encounters:  09/14/21 104/72         Passed - Valid encounter within last 12 months    Recent Outpatient Visits           8 months ago Encounter for gynecological examination without abnormal finding   [place], Megan P, DO   8 months ago Routine general  medical examination at a health care facility   [facility], [place], DO   1 year ago Hypothyroidism, unspecified type   [place], [place], DO   1 year ago Peripheral edema   [place], [place], DO   1 year ago Type 2 diabetes mellitus, uncontrolled, with renal complications [facility])   [place], [place], DO              Passed - Patient is not a smoker

## 2022-05-13 NOTE — Telephone Encounter (Signed)
Requested medication (s) are due for refill today:   Yes  Requested medication (s) are on the active medication list:   Yes  Future visit scheduled:   No  cancelled appt. For 11/30/2021   Last ordered: 05/13/2022 #28, 0 refills  Returned because a 90 day supply is being requested   Requested Prescriptions  Pending Prescriptions Disp Refills   HEATHER 0.35 MG tablet [Pharmacy Med Name: HEATHER 0.35MG  TABLETS 28S] 84 tablet     Sig: TAKE 1 TABLET(0.35 MG) BY MOUTH DAILY     OB/GYN: Contraceptives - Progestins Passed - 05/13/2022  2:59 PM      Passed - Last BP in normal range    BP Readings from Last 1 Encounters:  09/14/21 104/72         Passed - Valid encounter within last 12 months    Recent Outpatient Visits           8 months ago Encounter for gynecological examination without abnormal finding   [place], Megan P, DO   8 months ago Routine general medical examination at a health care facility   [facility], [place], DO   1 year ago Hypothyroidism, unspecified type   [place], [place], DO   1 year ago Peripheral edema   [place], [place], DO   1 year ago Type 2 diabetes mellitus, uncontrolled, with renal complications [facility])   [place], [place], DO              Passed - Patient is not a smoker

## 2022-05-14 NOTE — Telephone Encounter (Signed)
LVM asking patient to call back to schedule an appt 

## 2022-07-13 ENCOUNTER — Telehealth: Payer: Medicaid Other | Admitting: Family Medicine

## 2022-07-13 DIAGNOSIS — J029 Acute pharyngitis, unspecified: Secondary | ICD-10-CM

## 2022-07-13 DIAGNOSIS — J019 Acute sinusitis, unspecified: Secondary | ICD-10-CM

## 2022-07-13 DIAGNOSIS — B9689 Other specified bacterial agents as the cause of diseases classified elsewhere: Secondary | ICD-10-CM | POA: Diagnosis not present

## 2022-07-13 MED ORDER — LIDOCAINE VISCOUS HCL 2 % MT SOLN: 15.0000 mL | OROMUCOSAL | 0 refills | Status: DC | PRN

## 2022-07-13 MED ORDER — FLUTICASONE PROPIONATE 50 MCG/ACT NA SUSP: 2.0000 | Freq: Every day | NASAL | 0 refills | Status: AC

## 2022-07-13 MED ORDER — PSEUDOEPH-BROMPHEN-DM 30-2-10 MG/5ML PO SYRP: 5.0000 mL | ORAL_SOLUTION | Freq: Three times a day (TID) | ORAL | 0 refills | Status: DC | PRN

## 2022-07-13 MED ORDER — AMOXICILLIN-POT CLAVULANATE 875-125 MG PO TABS: 1.0000 | ORAL_TABLET | Freq: Two times a day (BID) | ORAL | 0 refills | Status: AC

## 2022-07-13 NOTE — Patient Instructions (Addendum)
[name], thank you for joining [name], NP for today's virtual visit.  While this provider is not your primary care provider (PCP), if your PCP is located in our provider database this encounter information will be shared with them immediately following your visit.   [facility] account gives you access to today's visit and all your visits, tests, and labs performed at [facility] " click here if you don't have a [facility] account or go to [email or web address]  Consent: (Patient) Maria Reyes provided verbal consent for this virtual visit at the beginning of the encounter.  Current Medications:  Current Outpatient Medications:    amoxicillin-clavulanate (AUGMENTIN) 875-125 MG tablet, Take 1 tablet by mouth 2 (two) times daily for 7 days., Disp: 14 tablet, Rfl: 0   brompheniramine-pseudoephedrine-DM 30-2-10 MG/5ML syrup, Take 5 mLs by mouth 3 (three) times daily as needed., Disp: 120 mL, Rfl: 0   fluticasone (FLONASE) 50 MCG/ACT nasal spray, Place 2 sprays into both nostrils daily., Disp: 16 g, Rfl: 0   lidocaine (XYLOCAINE) 2 % solution, Use as directed 15 mLs in the mouth or throat as needed for mouth pain., Disp: 100 mL, Rfl: 0   atorvastatin (LIPITOR) 80 MG tablet, TAKE 1 TABLET(80 MG) BY MOUTH DAILY AT 6 PM, Disp: 90 tablet, Rfl: 3   COLLAGEN PO, Take 1,000 mg by mouth daily., Disp: , Rfl:    diclofenac Sodium (VOLTAREN) 1 % GEL, Apply 4 g topically 4 (four) times daily., Disp: 100 g, Rfl: 3   JARDIANCE 25 MG TABS tablet, TAKE 1 TABLET(25 MG) BY MOUTH DAILY BEFORE BREAKFAST, Disp: 90 tablet, Rfl: 1   levothyroxine (SYNTHROID) 25 MCG tablet, TAKE 1 TABLET BY MOUTH DAILY BEFORE AND BREAKFAST, Disp: 90 tablet, Rfl: 3   lisinopril (ZESTRIL) 10 MG tablet, TAKE 1 TABLET(10 MG) BY MOUTH DAILY, Disp: 90 tablet, Rfl: 1   metFORMIN (GLUCOPHAGE-XR) 500 MG 24 hr tablet, TAKE 2 TABLETS(1000 MG) BY MOUTH TWICE DAILY, Disp: 360 tablet, Rfl: 1    norethindrone (HEATHER) 0.35 MG tablet, TAKE 1 TABLET(0.35 MG) BY MOUTH DAILY, Disp: 28 tablet, Rfl: 0   TRULICITY 3 BJ/4.7WG SOPN, ADMINISTER 3MG  UNDER THE SKIN 1 TIME A WEEK AS DIRECTED, Disp: 6 mL, Rfl: 0   Medications ordered in this encounter:  Meds ordered this encounter  Medications   fluticasone (FLONASE) 50 MCG/ACT nasal spray    Sig: Place 2 sprays into both nostrils daily.    Dispense:  16 g    Refill:  0    Order Specific Question:   Supervising Provider    Answer:   Chase Picket [id number]   amoxicillin-clavulanate (AUGMENTIN) 875-125 MG tablet    Sig: Take 1 tablet by mouth 2 (two) times daily for 7 days.    Dispense:  14 tablet    Refill:  0    Order Specific Question:   Supervising Provider    Answer:   Chase Picket [[id number]]   lidocaine (XYLOCAINE) 2 % solution    Sig: Use as directed 15 mLs in the mouth or throat as needed for mouth pain.    Dispense:  100 mL    Refill:  0    Order Specific Question:   Supervising Provider    Answer:   Chase Picket [id number]   brompheniramine-pseudoephedrine-DM 30-2-10 MG/5ML syrup    Sig: Take 5 mLs by mouth 3 (three) times daily as needed.    Dispense:  120 mL    Refill:  0    Order Specific Question:   Supervising Provider    Answer:   Chase Picket [[id number]]     *If you need refills on other medications prior to your next appointment, please contact your pharmacy*  Follow-Up: Call back or seek an in-person evaluation if the symptoms worsen or if the condition fails to improve as anticipated.  [place] [phone]  Other Instructions  -Take meds as prescribed -Rest -Use a cool mist humidifier especially during the winter months when heat dries out the air. - Use saline nose sprays frequently to help soothe nasal passages and promote drainage. -stay hydrated by drinking plenty of fluids - Keep thermostat turn down low to prevent drying out sinuses - For any cough or congestion-  robitussin DM or Delsym as needed - For fever or aches or pains- take tylenol or ibuprofen as directed on bottle             * for fevers greater than 101 orally you may alternate ibuprofen and tylenol every 3 hours.  If you have been instructed to have an in-person evaluation today at a local Urgent Care facility, please use the link below. It will take you to a list of all of our available [facility], including address, phone number and hours of operation. Please do not delay care.  [facility]  If you or a family member do not have a primary care provider, use the link below to schedule a visit and establish care. When you choose a [facility] primary care physician or advanced practice provider, you gain a long-term partner in health. Find a Primary Care Provider  Learn more about [facility]'s in-office and virtual care options: [facility]

## 2022-07-13 NOTE — Progress Notes (Signed)
Virtual Visit Consent   Maria Reyes, you are scheduled for a virtual visit with a [facility] provider today. Just as with appointments in the office, your consent must be obtained to participate. Your consent will be active for this visit and any virtual visit you may have with one of our providers in the next 365 days. If you have a MyChart account, a copy of this consent can be sent to you electronically.  As this is a virtual visit, video technology does not allow for your provider to perform a traditional examination. This may limit your provider's ability to fully assess your condition. If your provider identifies any concerns that need to be evaluated in person or the need to arrange testing (such as labs, EKG, etc.), we will make arrangements to do so. Although advances in technology are sophisticated, we cannot ensure that it will always work on either your end or our end. If the connection with a video visit is poor, the visit may have to be switched to a telephone visit. With either a video or telephone visit, we are not always able to ensure that we have a secure connection.  By engaging in this virtual visit, you consent to the provision of healthcare and authorize for your insurance to be billed (if applicable) for the services provided during this visit. Depending on your insurance coverage, you may receive a charge related to this service.  I need to obtain your verbal consent now. Are you willing to proceed with your visit today? [name] has provided verbal consent on 07/13/2022 for a virtual visit (video or telephone). [name], NP  Date: 07/13/2022 10:41 AM  Virtual Visit via Video Note   I, [name], connected with  Maria Reyes  ([id number], 26-Jul-1990) on 07/13/22 at 10:45 AM EST by a video-enabled telemedicine application and verified that I am speaking with the correct person using two identifiers.  Location: Patient: Virtual Visit Location Patient:  Home Provider: Virtual Visit Location Provider: Home Office   I discussed the limitations of evaluation and management by telemedicine and the availability of in person appointments. The patient expressed understanding and agreed to proceed.    History of Present Illness: Maria Reyes is a 32 y.o. who identifies as a female who was assigned female at birth, and is being seen today for cough, ear ache, and sore throat. Onset was last Wednesday- 07/07/22.  Associated symptoms increased mucus-gagging some, right nostril was clogged, the muffled sound in the right ear. Modifying factors humidifier, decongestant, breath right strip, ear drops- OTC Known sick contacts all family - son had RSV and Whooping cough Denies chest pain, shortness of breath, fever, chills.   Problems:  Patient Active Problem List   Diagnosis Date Noted   Hyperlipidemia associated with type 2 diabetes mellitus ([place]) 05/19/2018   Type 2 diabetes mellitus with renal complication ([place]) 35/57/3220   Depression 10/02/2015   Hypothyroidism 08/20/2015   Obesity, Class III, BMI 40-49.9 (morbid obesity) ([place]) 08/19/2015   Abnormal menstrual cycle 08/19/2015   Benign hypertensive renal disease    Ovarian cyst     Allergies: No Known Allergies Medications:  Current Outpatient Medications:    atorvastatin (LIPITOR) 80 MG tablet, TAKE 1 TABLET(80 MG) BY MOUTH DAILY AT 6 PM, Disp: 90 tablet, Rfl: 3   COLLAGEN PO, Take 1,000 mg by mouth daily., Disp: , Rfl:    diclofenac Sodium (VOLTAREN) 1 % GEL, Apply 4 g topically 4 (four) times daily., Disp: 100  g, Rfl: 3   JARDIANCE 25 MG TABS tablet, TAKE 1 TABLET(25 MG) BY MOUTH DAILY BEFORE BREAKFAST, Disp: 90 tablet, Rfl: 1   levothyroxine (SYNTHROID) 25 MCG tablet, TAKE 1 TABLET BY MOUTH DAILY BEFORE AND BREAKFAST, Disp: 90 tablet, Rfl: 3   lisinopril (ZESTRIL) 10 MG tablet, TAKE 1 TABLET(10 MG) BY MOUTH DAILY, Disp: 90 tablet, Rfl: 1   metFORMIN (GLUCOPHAGE-XR) 500 MG 24 hr tablet,  TAKE 2 TABLETS(1000 MG) BY MOUTH TWICE DAILY, Disp: 360 tablet, Rfl: 1   norethindrone (HEATHER) 0.35 MG tablet, TAKE 1 TABLET(0.35 MG) BY MOUTH DAILY, Disp: 28 tablet, Rfl: 0   TRULICITY 3 HC/6.2BJ SOPN, ADMINISTER 3MG  UNDER THE SKIN 1 TIME A WEEK AS DIRECTED, Disp: 6 mL, Rfl: 0  Observations/Objective: Patient is well-developed, well-nourished in no acute distress.  Resting comfortably  at home.  Head is normocephalic, atraumatic.  No labored breathing.  Speech is clear and coherent with logical content.  Patient is alert and oriented at baseline.  Cough   Assessment and Plan:  1. Acute bacterial sinusitis  - fluticasone (FLONASE) 50 MCG/ACT nasal spray; Place 2 sprays into both nostrils daily.  Dispense: 16 g; Refill: 0 - amoxicillin-clavulanate (AUGMENTIN) 875-125 MG tablet; Take 1 tablet by mouth 2 (two) times daily for 7 days.  Dispense: 14 tablet; Refill: 0 - brompheniramine-pseudoephedrine-DM 30-2-10 MG/5ML syrup; Take 5 mLs by mouth 3 (three) times daily as needed.  Dispense: 120 mL; Refill: 0  2. Sore throat  - lidocaine (XYLOCAINE) 2 % solution; Use as directed 15 mLs in the mouth or throat as needed for mouth pain.  Dispense: 100 mL; Refill: 0 - brompheniramine-pseudoephedrine-DM 30-2-10 MG/5ML syrup; Take 5 mLs by mouth 3 (three) times daily as needed.  Dispense: 120 mL; Refill: 0  -Take meds as prescribed -Rest -Use a cool mist humidifier especially during the winter months when heat dries out the air. - Use saline nose sprays frequently to help soothe nasal passages and promote drainage. -stay hydrated by drinking plenty of fluids - Keep thermostat turn down low to prevent drying out sinuses - For any cough or congestion- robitussin DM or Delsym as needed - For fever or aches or pains- take tylenol or ibuprofen as directed on bottle             * for fevers greater than 101 orally you may alternate ibuprofen and tylenol every 3 hours.  If you do not improve you will  need a follow up visit in person.                Reviewed side effects, risks and benefits of medication.    Patient acknowledged agreement and understanding of the plan.   Past Medical, Surgical, Social History, Allergies, and Medications have been Reviewed.     Follow Up Instructions: I discussed the assessment and treatment plan with the patient. The patient was provided an opportunity to ask questions and all were answered. The patient agreed with the plan and demonstrated an understanding of the instructions.  A copy of instructions were sent to the patient via MyChart unless otherwise noted below.    The patient was advised to call back or seek an in-person evaluation if the symptoms worsen or if the condition fails to improve as anticipated.  Time:  I spent 10 minutes with the patient via telehealth technology discussing the above problems/concerns.    [name], NP

## 2022-08-24 ENCOUNTER — Other Ambulatory Visit: Payer: Self-pay | Admitting: Family Medicine

## 2022-08-24 NOTE — Telephone Encounter (Signed)
Called patient to schedule appt for medication refills. No answer, LVMTCB [phone].

## 2022-08-24 NOTE — Telephone Encounter (Signed)
Requested medication (s) are due for refill today: yes   Requested medication (s) are on the active medication list: yes   Last refill:  zestril, jardiance- 10/26/21 #90 1 refills , metformin- 10/26/21 #360 1 refills  Future visit scheduled: no   Notes to clinic:   overdue encounter. Called patient to schedule appt for medication refills. No answer. LVMTCB. Do you want to refill Rxs?     Requested Prescriptions  Pending Prescriptions Disp Refills   lisinopril (ZESTRIL) 10 MG tablet [Pharmacy Med Name: LISINOPRIL 10MG TABLETS] 90 tablet 1    Sig: TAKE 1 TABLET(10 MG) BY MOUTH DAILY     Cardiovascular:  ACE Inhibitors Failed - 08/24/2022  3:34 AM      Failed - Cr in normal range and within 180 days    Creatinine, Ser  Date Value Ref Range Status  08/31/2021 0.54 (L) 0.57 - 1.00 mg/dL Final         Failed - K in normal range and within 180 days    Potassium  Date Value Ref Range Status  08/31/2021 3.9 3.5 - 5.2 mmol/L Final         Failed - Valid encounter within last 6 months    Recent Outpatient Visits           11 months ago Encounter for gynecological examination without abnormal finding   [place], Megan P, DO   11 months ago Routine general medical examination at a health care facility   [place], [place] P, DO   1 year ago Hypothyroidism, unspecified type   [place], [place] P, DO   1 year ago Peripheral edema   [place], [place] P, DO   1 year ago Type 2 diabetes mellitus, uncontrolled, with renal complications [facility])   [place], [place], [place]              Passed - Patient is not pregnant      Passed - Last BP in normal range    BP Readings from Last 1 Encounters:  09/14/21 104/72          metFORMIN (GLUCOPHAGE-XR) 500 MG 24 hr tablet [Pharmacy Med Name: METFORMIN ER 500MG 24HR TABS] 360  tablet 1    Sig: TAKE 2 TABLETS(1000 MG) BY MOUTH TWICE DAILY     Endocrinology:  Diabetes - Biguanides Failed - 08/24/2022  3:34 AM      Failed - Cr in normal range and within 360 days    Creatinine, Ser  Date Value Ref Range Status  08/31/2021 0.54 (L) 0.57 - 1.00 mg/dL Final         Failed - HBA1C is between 0 and 7.9 and within 180 days    HB A1C (BAYER DCA - WAIVED)  Date Value Ref Range Status  08/31/2021 6.6 (H) 4.8 - 5.6 % Final    Comment:             Prediabetes: 5.7 - 6.4          Diabetes: >6.4          Glycemic control for adults with diabetes: <7.0          Failed - B12 Level in normal range and within 720 days    No results found for: "VITAMINB12"       Failed - Valid encounter within last 6 months  Recent Outpatient Visits           11 months ago Encounter for gynecological examination without abnormal finding   [place], DO   11 months ago Routine general medical examination at a health care facility   [place], [place], DO   1 year ago Hypothyroidism, unspecified type   [place], [place], DO   1 year ago Peripheral edema   [place], [place], DO   1 year ago Type 2 diabetes mellitus, uncontrolled, with renal complications [facility])   [place], Megan P, DO              Passed - eGFR in normal range and within 360 days    GFR calc Af Amer  Date Value Ref Range Status  07/01/2020 153 >59 mL/min/1.73 Final    Comment:    **In accordance with recommendations from the NKF-ASN Task force,**   [facility] is in the process of updating its eGFR calculation to the   2021 CKD-EPI creatinine equation that estimates kidney function   without a race variable.    GFR calc non Af Amer  Date Value Ref Range Status  07/01/2020 133 >59 mL/min/1.73 Final   eGFR  Date Value Ref  Range Status  08/31/2021 129 >59 mL/min/1.73 Final         Passed - CBC within normal limits and completed in the last 12 months    WBC  Date Value Ref Range Status  08/31/2021 8.7 3.4 - 10.8 x10E3/uL Final  11/30/2016 14.9 (H) 3.6 - 11.0 K/uL Final   RBC  Date Value Ref Range Status  08/31/2021 5.20 3.77 - 5.28 x10E6/uL Final  11/30/2016 4.86 3.80 - 5.20 MIL/uL Final   Hemoglobin  Date Value Ref Range Status  08/31/2021 14.0 11.1 - 15.9 g/dL Final   Hematocrit  Date Value Ref Range Status  08/31/2021 43.1 34.0 - 46.6 % Final   MCHC  Date Value Ref Range Status  08/31/2021 32.5 31.5 - 35.7 g/dL Final  11/30/2016 32.5 32.0 - 36.0 g/dL Final   [facility]  Date Value Ref Range Status  08/31/2021 26.9 26.6 - 33.0 pg Final  11/30/2016 25.4 (L) 26.0 - 34.0 pg Final   MCV  Date Value Ref Range Status  08/31/2021 83 79 - 97 fL Final   No results found for: "PLTCOUNTKUC", "LABPLAT", "POCPLA" RDW  Date Value Ref Range Status  08/31/2021 13.1 11.7 - 15.4 % Final          JARDIANCE 25 MG TABS tablet [Pharmacy Med Name: JARDIANCE 25MG TABLETS] 90 tablet 1    Sig: TAKE 1 TABLET(25 MG) BY MOUTH DAILY BEFORE BREAKFAST     Endocrinology:  Diabetes - SGLT2 Inhibitors Failed - 08/24/2022  3:34 AM      Failed - Cr in normal range and within 360 days    Creatinine, Ser  Date Value Ref Range Status  08/31/2021 0.54 (L) 0.57 - 1.00 mg/dL Final         Failed - HBA1C is between 0 and 7.9 and within 180 days    HB A1C (BAYER DCA - WAIVED)  Date Value Ref Range Status  08/31/2021 6.6 (H) 4.8 - 5.6 % Final    Comment:             Prediabetes: 5.7 - 6.4  Diabetes: >6.4          Glycemic control for adults with diabetes: <7.0          Failed - Valid encounter within last 6 months    Recent Outpatient Visits           11 months ago Encounter for gynecological examination without abnormal finding   [place], DO   11 months ago  Routine general medical examination at a health care facility   [place], [place], DO   1 year ago Hypothyroidism, unspecified type   [place], [place], DO   1 year ago Peripheral edema   [place], [place], DO   1 year ago Type 2 diabetes mellitus, uncontrolled, with renal complications [facility])   [place], Megan P, DO              Passed - eGFR in normal range and within 360 days    GFR calc Af Amer  Date Value Ref Range Status  07/01/2020 153 >59 mL/min/1.73 Final    Comment:    **In accordance with recommendations from the NKF-ASN Task force,**   [facility] is in the process of updating its eGFR calculation to the   2021 CKD-EPI creatinine equation that estimates kidney function   without a race variable.    GFR calc non Af Amer  Date Value Ref Range Status  07/01/2020 133 >59 mL/min/1.73 Final   eGFR  Date Value Ref Range Status  08/31/2021 129 >59 mL/min/1.73 Final

## 2022-08-25 NOTE — Telephone Encounter (Signed)
LVM asking patient to call back to schedule an appt 

## 2022-08-25 NOTE — Telephone Encounter (Signed)
Needs appt

## 2022-08-31 NOTE — Telephone Encounter (Signed)
Called to get the pt scheduled for an appointment lmom.

## 2022-09-06 NOTE — Telephone Encounter (Signed)
Called lmom for the pt to call the office to get scheduled

## 2022-09-08 NOTE — Telephone Encounter (Signed)
Called lmom for the pt to call the office to get scheduled.

## 2022-11-08 ENCOUNTER — Ambulatory Visit: Payer: Medicaid Other | Admitting: Family Medicine

## 2022-11-08 ENCOUNTER — Encounter: Payer: Self-pay | Admitting: Family Medicine

## 2022-11-08 VITALS — BP 122/76 | HR 92 | Temp 98.6°F | Wt 279.6 lb

## 2022-11-08 DIAGNOSIS — E785 Hyperlipidemia, unspecified: Secondary | ICD-10-CM | POA: Diagnosis not present

## 2022-11-08 DIAGNOSIS — E039 Hypothyroidism, unspecified: Secondary | ICD-10-CM | POA: Diagnosis not present

## 2022-11-08 DIAGNOSIS — N182 Chronic kidney disease, stage 2 (mild): Secondary | ICD-10-CM | POA: Diagnosis not present

## 2022-11-08 DIAGNOSIS — F332 Major depressive disorder, recurrent severe without psychotic features: Secondary | ICD-10-CM

## 2022-11-08 DIAGNOSIS — E1122 Type 2 diabetes mellitus with diabetic chronic kidney disease: Secondary | ICD-10-CM | POA: Diagnosis not present

## 2022-11-08 DIAGNOSIS — E1169 Type 2 diabetes mellitus with other specified complication: Secondary | ICD-10-CM

## 2022-11-08 DIAGNOSIS — I129 Hypertensive chronic kidney disease with stage 1 through stage 4 chronic kidney disease, or unspecified chronic kidney disease: Secondary | ICD-10-CM

## 2022-11-08 LAB — MICROALBUMIN, URINE WAIVED
Creatinine, Urine Waived: 50 mg/dL (ref 10–300)
Microalb, Ur Waived: 80 mg/L — ABNORMAL HIGH (ref 0–19)

## 2022-11-08 LAB — URINALYSIS, ROUTINE W REFLEX MICROSCOPIC
Bilirubin, UA: NEGATIVE
Ketones, UA: NEGATIVE
Leukocytes,UA: NEGATIVE
Nitrite, UA: NEGATIVE
Protein,UA: NEGATIVE
RBC, UA: NEGATIVE
Specific Gravity, UA: 1.01 (ref 1.005–1.030)
Urobilinogen, Ur: 0.2 mg/dL (ref 0.2–1.0)
pH, UA: 5 (ref 5.0–7.5)

## 2022-11-08 LAB — BAYER DCA HB A1C WAIVED: HB A1C (BAYER DCA - WAIVED): 12.2 % — ABNORMAL HIGH (ref 4.8–5.6)

## 2022-11-08 MED ORDER — NORETHINDRONE 0.35 MG PO TABS: ORAL_TABLET | ORAL | 3 refills | Status: DC

## 2022-11-08 MED ORDER — EMPAGLIFLOZIN 25 MG PO TABS: ORAL_TABLET | ORAL | 0 refills | Status: DC

## 2022-11-08 MED ORDER — OZEMPIC (0.25 OR 0.5 MG/DOSE) 2 MG/3ML ~~LOC~~ SOPN: 0.2500 mg | PEN_INJECTOR | SUBCUTANEOUS | 0 refills | Status: DC

## 2022-11-08 MED ORDER — OZEMPIC (0.25 OR 0.5 MG/DOSE) 2 MG/3ML ~~LOC~~ SOPN: 0.5000 mg | PEN_INJECTOR | SUBCUTANEOUS | 2 refills | Status: DC

## 2022-11-08 MED ORDER — ATORVASTATIN CALCIUM 80 MG PO TABS: ORAL_TABLET | ORAL | 0 refills | Status: DC

## 2022-11-08 MED ORDER — METFORMIN HCL ER 500 MG PO TB24: ORAL_TABLET | ORAL | 0 refills | Status: DC

## 2022-11-08 MED ORDER — LISINOPRIL 5 MG PO TABS: 5.0000 mg | ORAL_TABLET | Freq: Every day | ORAL | 2 refills | Status: DC

## 2022-11-08 NOTE — Assessment & Plan Note (Signed)
Has been off her medicine. Will restart her medicine and recheck 6 weeks.

## 2022-11-08 NOTE — Assessment & Plan Note (Addendum)
Not under good control with A1c of 12.2. Has been off her medicine for about 4 weeks. Will restart her metformin, jardiance and glipizide and start ozempic and recheck 6 weeks. Call with any concerns. Labs drawn today.

## 2022-11-08 NOTE — Progress Notes (Addendum)
BP 122/76   Pulse 92   Temp 98.6 F (37 C) (Oral)   Wt 279 lb 9.6 oz (126.8 kg)   SpO2 96%   BMI 47.99 kg/m    Subjective:    Patient ID: Maria Reyes, female    DOB: May 13, 1991, 32 y.o.   MRN: [id number]  HPI: Maria Reyes is a 32 y.o. female  Chief Complaint  Patient presents with   Diabetes    Patient declines having a recent Diabetic Eye Exam.    Hyperlipidemia   Hypothyroidism   DIABETES- Has been off her medicine for about 4 weeks Hypoglycemic episodes:no Polydipsia/polyuria: no Visual disturbance: no Chest pain: no Paresthesias: no Glucose Monitoring: no  Accucheck frequency: Not Checking Taking Insulin?: no Blood Pressure Monitoring: not checking Retinal Examination: Not up to Date Foot Exam: Up to Date Diabetic Education: Completed Pneumovax: Up to Date Influenza: Up to Date Aspirin: no  HYPERTENSION / HYPERLIPIDEMIA- Has been off her medicine for about 4 weeks Satisfied with current treatment? yes Duration of hypertension: chronic BP monitoring frequency: not checking BP medication side effects: no Past BP meds: lisinopril Duration of hyperlipidemia: chronic Cholesterol medication side effects: no Cholesterol supplements: none Past cholesterol medications: atorvastatin Medication compliance: poor compliance Aspirin: no Recent stressors: no Recurrent headaches: no Visual changes: no Palpitations: no Dyspnea: no Chest pain: no Lower extremity edema: no Dizzy/lightheaded: no  HYPOTHYROIDISM Thyroid control status:uncontrolled Satisfied with current treatment? Has been off her medicine for about 4 weeks Medication side effects: no Medication compliance: poor compliance Recent dose adjustment:no Fatigue: no Cold intolerance: no Heat intolerance: no Weight gain: no Weight loss: no Constipation: no Diarrhea/loose stools: no Palpitations: no Lower extremity edema: no Anxiety/depressed mood: no   Relevant past medical, surgical,  family and social history reviewed and updated as indicated. Interim medical history since our last visit reviewed. Allergies and medications reviewed and updated.  Review of Systems  Constitutional: Negative.   Respiratory: Negative.    Cardiovascular: Negative.   Gastrointestinal: Negative.   Genitourinary: Negative.   Musculoskeletal: Negative.   Skin: Negative.   Neurological: Negative.   Psychiatric/Behavioral: Negative.      Per HPI unless specifically indicated above     Objective:    BP 122/76   Pulse 92   Temp 98.6 F (37 C) (Oral)   Wt 279 lb 9.6 oz (126.8 kg)   SpO2 96%   BMI 47.99 kg/m   Wt Readings from Last 3 Encounters:  11/08/22 279 lb 9.6 oz (126.8 kg)  09/14/21 249 lb 6.4 oz (113.1 kg)  08/31/21 274 lb 6.4 oz (124.5 kg)    Physical Exam Vitals and nursing note reviewed.  Constitutional:      General: She is not in acute distress.    Appearance: Normal appearance. She is obese. She is not ill-appearing, toxic-appearing or diaphoretic.  HENT:     Head: Normocephalic and atraumatic.     Right Ear: External ear normal.     Left Ear: External ear normal.     Nose: Nose normal.     Mouth/Throat:     Mouth: Mucous membranes are moist.     Pharynx: Oropharynx is clear.  Eyes:     General: No scleral icterus.       Right eye: No discharge.        Left eye: No discharge.     Extraocular Movements: Extraocular movements intact.     Conjunctiva/sclera: Conjunctivae normal.     Pupils: Pupils are equal, round,  and reactive to light.  Cardiovascular:     Rate and Rhythm: Normal rate and regular rhythm.     Pulses: Normal pulses.     Heart sounds: Normal heart sounds. No murmur heard.    No friction rub. No gallop.  Pulmonary:     Effort: Pulmonary effort is normal. No respiratory distress.     Breath sounds: Normal breath sounds. No stridor. No wheezing, rhonchi or rales.  Chest:     Chest wall: No tenderness.  Musculoskeletal:        General:  Normal range of motion.     Cervical back: Normal range of motion and neck supple.  Skin:    General: Skin is warm and dry.     Capillary Refill: Capillary refill takes less than 2 seconds.     Coloration: Skin is not jaundiced or pale.     Findings: No bruising, erythema, lesion or rash.  Neurological:     General: No focal deficit present.     Mental Status: She is alert and oriented to person, place, and time. Mental status is at baseline.  Psychiatric:        Mood and Affect: Mood normal.        Behavior: Behavior normal.        Thought Content: Thought content normal.        Judgment: Judgment normal.     Results for orders placed or performed in visit on 11/08/22  Bayer DCA Hb A1c Waived  Result Value Ref Range   HB A1C (BAYER DCA - WAIVED) 12.2 (H) 4.8 - 5.6 %  Microalbumin, Urine Waived  Result Value Ref Range   Microalb, Ur Waived 80 (H) 0 - 19 mg/L   Creatinine, Urine Waived 50 10 - 300 mg/dL   Microalb/Creat Ratio 30-300 (H) <30 mg/g  Urinalysis, Routine w reflex microscopic  Result Value Ref Range   Specific Gravity, UA 1.010 1.005 - 1.030   pH, UA 5.0 5.0 - 7.5   Color, UA Yellow Yellow   Appearance Ur Clear Clear   Leukocytes,UA Negative Negative   Protein,UA Negative Negative/Trace   Glucose, UA 2+ (A) Negative   Ketones, UA Negative Negative   RBC, UA Negative Negative   Bilirubin, UA Negative Negative   Urobilinogen, Ur 0.2 0.2 - 1.0 mg/dL   Nitrite, UA Negative Negative   Microscopic Examination Comment       Assessment & Plan:   Problem List Items Addressed This Visit       Endocrine   Type 2 diabetes mellitus with renal complication (HCC)    Not under good control with A1c of 12.2. Has been off her medicine for about 4 weeks. Will restart her metformin, jardiance and glipizide and start ozempic and recheck 6 weeks. Call with any concerns. Labs drawn today.       Relevant Medications   Semaglutide,0.25 or 0.5MG /DOS, (OZEMPIC, 0.25 OR 0.5  MG/DOSE,) 2 MG/3ML SOPN   Semaglutide,0.25 or 0.5MG /DOS, (OZEMPIC, 0.25 OR 0.5 MG/DOSE,) 2 MG/3ML SOPN   lisinopril (ZESTRIL) 5 MG tablet   atorvastatin (LIPITOR) 80 MG tablet   empagliflozin (JARDIANCE) 25 MG TABS tablet   metFORMIN (GLUCOPHAGE-XR) 500 MG 24 hr tablet   Other Relevant Orders   Bayer DCA Hb A1c Waived (Completed)   CBC with Differential/Platelet   Comprehensive metabolic panel   Microalbumin, Urine Waived (Completed)   Urinalysis, Routine w reflex microscopic (Completed)   Hypothyroidism    Has been off her medicine. Will restart  her medicine and recheck 6 weeks.      Relevant Orders   CBC with Differential/Platelet   Comprehensive metabolic panel   TSH   Hyperlipidemia associated with type 2 diabetes mellitus (HCC) - Primary    Has been off her medicine for about 4 weeks. Will restart her atorvastatin and recheck 6 weeks. Call with any concerns. Labs drawn today.       Relevant Medications   Semaglutide,0.25 or 0.5MG /DOS, (OZEMPIC, 0.25 OR 0.5 MG/DOSE,) 2 MG/3ML SOPN   Semaglutide,0.25 or 0.5MG /DOS, (OZEMPIC, 0.25 OR 0.5 MG/DOSE,) 2 MG/3ML SOPN   lisinopril (ZESTRIL) 5 MG tablet   atorvastatin (LIPITOR) 80 MG tablet   empagliflozin (JARDIANCE) 25 MG TABS tablet   metFORMIN (GLUCOPHAGE-XR) 500 MG 24 hr tablet   Other Relevant Orders   CBC with Differential/Platelet   Comprehensive metabolic panel   Lipid Panel w/o Chol/HDL Ratio     Genitourinary   Benign hypertensive renal disease    Doing well off medicine. Will decrease her lisinopril to 5mg  and recheck 6 weeks. Call with any concerns.       Relevant Orders   CBC with Differential/Platelet   Comprehensive metabolic panel   Microalbumin, Urine Waived (Completed)   Urinalysis, Routine w reflex microscopic (Completed)     Other   Obesity, Class III, BMI 40-49.9 (morbid obesity) (HCC)    Starting ozempic for her sugars. Recheck 6 weeks. Call with any concerns.       Relevant Medications    Semaglutide,0.25 or 0.5MG /DOS, (OZEMPIC, 0.25 OR 0.5 MG/DOSE,) 2 MG/3ML SOPN   Semaglutide,0.25 or 0.5MG /DOS, (OZEMPIC, 0.25 OR 0.5 MG/DOSE,) 2 MG/3ML SOPN   empagliflozin (JARDIANCE) 25 MG TABS tablet   metFORMIN (GLUCOPHAGE-XR) 500 MG 24 hr tablet   Depression    Doing well off medicine. Continue to monitor. Call with any concerns Continue to monitor.       Relevant Orders   CBC with Differential/Platelet   Comprehensive metabolic panel     Follow up plan: Return in about 6 weeks (around 12/20/2022).

## 2022-11-08 NOTE — Assessment & Plan Note (Signed)
Doing well off medicine. Continue to monitor. Call with any concerns. Continue to monitor.  ?

## 2022-11-08 NOTE — Assessment & Plan Note (Signed)
Has been off her medicine for about 4 weeks. Will restart her atorvastatin and recheck 6 weeks. Call with any concerns. Labs drawn today.

## 2022-11-08 NOTE — Assessment & Plan Note (Signed)
Starting ozempic for her sugars. Recheck 6 weeks. Call with any concerns.

## 2022-11-08 NOTE — Assessment & Plan Note (Signed)
Doing well off medicine. Will decrease her lisinopril to 5mg  and recheck 6 weeks. Call with any concerns.

## 2022-11-09 ENCOUNTER — Encounter: Payer: Self-pay | Admitting: Family Medicine

## 2022-11-09 LAB — TSH: TSH: 3.6 u[IU]/mL (ref 0.450–4.500)

## 2022-11-09 LAB — CBC WITH DIFFERENTIAL/PLATELET
Basophils Absolute: 0 10*3/uL (ref 0.0–0.2)
Basos: 0 %
EOS (ABSOLUTE): 0.1 10*3/uL (ref 0.0–0.4)
Eos: 1 %
Hematocrit: 41.6 % (ref 34.0–46.6)
Hemoglobin: 13.1 g/dL (ref 11.1–15.9)
Immature Grans (Abs): 0 10*3/uL (ref 0.0–0.1)
Immature Granulocytes: 0 %
Lymphocytes Absolute: 2.9 10*3/uL (ref 0.7–3.1)
Lymphs: 30 %
MCH: 26.3 pg — ABNORMAL LOW (ref 26.6–33.0)
MCHC: 31.5 g/dL (ref 31.5–35.7)
MCV: 84 fL (ref 79–97)
Monocytes Absolute: 0.5 10*3/uL (ref 0.1–0.9)
Monocytes: 5 %
Neutrophils Absolute: 6.2 10*3/uL (ref 1.4–7.0)
Neutrophils: 64 %
Platelets: 268 10*3/uL (ref 150–450)
RBC: 4.98 x10E6/uL (ref 3.77–5.28)
RDW: 13.4 % (ref 11.7–15.4)
WBC: 9.8 10*3/uL (ref 3.4–10.8)

## 2022-11-09 LAB — COMPREHENSIVE METABOLIC PANEL
ALT: 42 IU/L — ABNORMAL HIGH (ref 0–32)
AST: 34 IU/L (ref 0–40)
Albumin/Globulin Ratio: 1.5 (ref 1.2–2.2)
Albumin: 4.1 g/dL (ref 4.0–5.0)
Alkaline Phosphatase: 111 IU/L (ref 44–121)
BUN/Creatinine Ratio: 20 (ref 9–23)
BUN: 12 mg/dL (ref 6–20)
Bilirubin Total: 0.3 mg/dL (ref 0.0–1.2)
CO2: 15 mmol/L — ABNORMAL LOW (ref 20–29)
Calcium: 9.4 mg/dL (ref 8.7–10.2)
Chloride: 98 mmol/L (ref 96–106)
Creatinine, Ser: 0.6 mg/dL (ref 0.57–1.00)
Globulin, Total: 2.7 g/dL (ref 1.5–4.5)
Glucose: 416 mg/dL — ABNORMAL HIGH (ref 70–99)
Potassium: 3.9 mmol/L (ref 3.5–5.2)
Sodium: 135 mmol/L (ref 134–144)
Total Protein: 6.8 g/dL (ref 6.0–8.5)
eGFR: 124 mL/min/{1.73_m2} (ref >59)

## 2022-11-09 LAB — LIPID PANEL W/O CHOL/HDL RATIO
Cholesterol, Total: 241 mg/dL — ABNORMAL HIGH (ref 100–199)
HDL: 51 mg/dL (ref >39)
LDL Chol Calc (NIH): 99 mg/dL (ref 0–99)
Triglycerides: 541 mg/dL — ABNORMAL HIGH (ref 0–149)
VLDL Cholesterol Cal: 91 mg/dL — ABNORMAL HIGH (ref 5–40)

## 2022-11-15 ENCOUNTER — Other Ambulatory Visit: Payer: Self-pay | Admitting: Family Medicine

## 2022-11-15 MED ORDER — LEVOTHYROXINE SODIUM 25 MCG PO TABS: ORAL_TABLET | ORAL | 2 refills | Status: DC

## 2022-11-15 NOTE — Telephone Encounter (Signed)
Can we please check on these? Is there a PA?

## 2023-01-03 ENCOUNTER — Ambulatory Visit: Payer: Medicaid Other | Admitting: Family Medicine

## 2023-02-04 ENCOUNTER — Other Ambulatory Visit: Payer: Self-pay | Admitting: Family Medicine

## 2023-02-04 NOTE — Telephone Encounter (Signed)
Requested Prescriptions  Pending Prescriptions Disp Refills   lisinopril (ZESTRIL) 5 MG tablet [Pharmacy Med Name: LISINOPRIL 5MG  TABLETS] 30 tablet 2    Sig: TAKE 1 TABLET(5 MG) BY MOUTH DAILY     Cardiovascular:  ACE Inhibitors Passed - 02/04/2023  3:35 AM      Passed - Cr in normal range and within 180 days    Creatinine, Ser  Date Value Ref Range Status  11/08/2022 0.60 0.57 - 1.00 mg/dL Final         Passed - K in normal range and within 180 days    Potassium  Date Value Ref Range Status  11/08/2022 3.9 3.5 - 5.2 mmol/L Final         Passed - Patient is not pregnant      Passed - Last BP in normal range    BP Readings from Last 1 Encounters:  11/08/22 122/76         Passed - Valid encounter within last 6 months    Recent Outpatient Visits           2 months ago Hyperlipidemia associated with type 2 diabetes mellitus (HCC)   [place] [facility] [place], Megan P, DO   1 year ago Encounter for gynecological examination without abnormal finding   [place] [facility] [place], Megan P, DO   1 year ago Routine general medical examination at a health care facility   [place] [facility] [place], [place] P, DO   1 year ago Hypothyroidism, unspecified type   [place] [facility] [place], Megan P, DO   2 years ago Peripheral edema   [place] [facility] [place], [place], DO       Future Appointments             In 1 week Laural Benes, Oralia Rud, DO [place] [facility]             metFORMIN (GLUCOPHAGE-XR) 500 MG 24 hr tablet [Pharmacy Med Name: METFORMIN ER 500MG  24HR TABS] 360 tablet 0    Sig: TAKE 2 TABLETS(1000 MG) BY MOUTH TWICE DAILY     Endocrinology:  Diabetes - Biguanides Failed - 02/04/2023  3:35 AM      Failed - HBA1C is between 0 and 7.9 and within 180 days    HB A1C (BAYER DCA - WAIVED)  Date Value Ref Range Status  11/08/2022 12.2 (H) 4.8 - 5.6 % Final     Comment:             Prediabetes: 5.7 - 6.4          Diabetes: >6.4          Glycemic control for adults with diabetes: <7.0          Failed - B12 Level in normal range and within 720 days    No results found for: "VITAMINB12"       Passed - Cr in normal range and within 360 days    Creatinine, Ser  Date Value Ref Range Status  11/08/2022 0.60 0.57 - 1.00 mg/dL Final         Passed - eGFR in normal range and within 360 days    GFR calc Af Amer  Date Value Ref Range Status  07/01/2020 153 >59 mL/min/1.73 Final    Comment:    **In accordance with recommendations from the NKF-ASN Task force,**   [facility] is in the process of updating its  eGFR calculation to the   2021 CKD-EPI creatinine equation that estimates kidney function   without a race variable.    GFR calc non Af Amer  Date Value Ref Range Status  07/01/2020 133 >59 mL/min/1.73 Final   eGFR  Date Value Ref Range Status  11/08/2022 124 >59 mL/min/1.73 Final         Passed - Valid encounter within last 6 months    Recent Outpatient Visits           2 months ago Hyperlipidemia associated with type 2 diabetes mellitus (HCC)   [place] [place], Megan P, DO   1 year ago Encounter for gynecological examination without abnormal finding   [place] [facility] [place], Megan P, DO   1 year ago Routine general medical examination at a health care facility   [facility] [place], [place] P, DO   1 year ago Hypothyroidism, unspecified type   [place] [facility] [place], Megan P, DO   2 years ago Peripheral edema   [place] [facility] [place], [place], DO       Future Appointments             In 1 week Laural Benes, Oralia Rud, DO [facility]            Passed - CBC within normal limits and completed in the last 12 months    WBC  Date Value Ref Range Status  11/08/2022 9.8 3.4 - 10.8 x10E3/uL  Final  11/30/2016 14.9 (H) 3.6 - 11.0 K/uL Final   RBC  Date Value Ref Range Status  11/08/2022 4.98 3.77 - 5.28 x10E6/uL Final  11/30/2016 4.86 3.80 - 5.20 MIL/uL Final   Hemoglobin  Date Value Ref Range Status  11/08/2022 13.1 11.1 - 15.9 g/dL Final   Hematocrit  Date Value Ref Range Status  11/08/2022 41.6 34.0 - 46.6 % Final   MCHC  Date Value Ref Range Status  11/08/2022 31.5 31.5 - 35.7 g/dL Final  21/30/8657 84.6 32.0 - 36.0 g/dL Final   [facility]  Date Value Ref Range Status  11/08/2022 26.3 (L) 26.6 - 33.0 pg Final  11/30/2016 25.4 (L) 26.0 - 34.0 pg Final   MCV  Date Value Ref Range Status  11/08/2022 84 79 - 97 fL Final   No results found for: "PLTCOUNTKUC", "LABPLAT", "POCPLA" RDW  Date Value Ref Range Status  11/08/2022 13.4 11.7 - 15.4 % Final          atorvastatin (LIPITOR) 80 MG tablet [Pharmacy Med Name: ATORVASTATIN 80MG  TABLETS] 90 tablet 0    Sig: TAKE 1 TABLET(80 MG) BY MOUTH DAILY AT 6 PM     Cardiovascular:  Antilipid - Statins Failed - 02/04/2023  3:35 AM      Failed - Lipid Panel in normal range within the last 12 months    Cholesterol, Total  Date Value Ref Range Status  11/08/2022 241 (H) 100 - 199 mg/dL Final   LDL Chol Calc (NIH)  Date Value Ref Range Status  11/08/2022 99 0 - 99 mg/dL Final   HDL  Date Value Ref Range Status  11/08/2022 51 >39 mg/dL Final   Triglycerides  Date Value Ref Range Status  11/08/2022 541 (H) 0 - 149 mg/dL Final         Passed - Patient is not pregnant      Passed - Valid encounter within last 12 months  Recent Outpatient Visits           2 months ago Hyperlipidemia associated with type 2 diabetes mellitus [facility])   [place] [facility] [place], [place] P, DO   1 year ago Encounter for gynecological examination without abnormal finding   [place] [facility] [place], DO   1 year ago Routine general medical examination at a health care facility   [facility] [place], [place] P, DO   1 year ago Hypothyroidism, unspecified type   [place] [facility] [place], Megan P, DO   2 years ago Peripheral edema   [place] [facility] [place] Carrow, DO       Future Appointments             In 1 week Laural Benes, Oralia Rud, DO [place] [facility]

## 2023-02-11 ENCOUNTER — Other Ambulatory Visit: Payer: Self-pay | Admitting: Family Medicine

## 2023-02-11 NOTE — Telephone Encounter (Signed)
Requested Prescriptions  Pending Prescriptions Disp Refills   levothyroxine (SYNTHROID) 25 MCG tablet [Pharmacy Med Name: LEVOTHYROXINE 0.025MG  ( ) TAB] 90 tablet 0    Sig: TAKE 1 TABLET BY MOUTH DAILY BEFORE BREAKFAST     Endocrinology:  Hypothyroid Agents Passed - 02/11/2023  3:34 AM      Passed - TSH in normal range and within 360 days    TSH  Date Value Ref Range Status  11/08/2022 3.600 0.450 - 4.500 uIU/mL Final         Passed - Valid encounter within last 12 months    Recent Outpatient Visits           3 months ago Hyperlipidemia associated with type 2 diabetes mellitus (HCC)   [place] [facility] [place], Megan P, DO   1 year ago Encounter for gynecological examination without abnormal finding   [place] [facility] [place] Carrow, DO   1 year ago Routine general medical examination at a health care facility   [place] [facility] [place], [place] P, DO   1 year ago Hypothyroidism, unspecified type   [place] [facility] [place], Megan P, DO   2 years ago Peripheral edema   [place] [facility] Dorcas Carrow, DO       Future Appointments             In 4 days Dorcas Carrow, DO [place] [facility]

## 2023-02-15 ENCOUNTER — Encounter: Payer: Self-pay | Admitting: Family Medicine

## 2023-02-15 ENCOUNTER — Ambulatory Visit: Payer: Medicaid Other | Admitting: Family Medicine

## 2023-02-15 VITALS — BP 129/82 | HR 81 | Temp 97.8°F | Wt 256.2 lb

## 2023-02-15 DIAGNOSIS — Z7985 Long-term (current) use of injectable non-insulin antidiabetic drugs: Secondary | ICD-10-CM

## 2023-02-15 DIAGNOSIS — N182 Chronic kidney disease, stage 2 (mild): Secondary | ICD-10-CM

## 2023-02-15 DIAGNOSIS — E1122 Type 2 diabetes mellitus with diabetic chronic kidney disease: Secondary | ICD-10-CM | POA: Diagnosis not present

## 2023-02-15 LAB — BAYER DCA HB A1C WAIVED: HB A1C (BAYER DCA - WAIVED): 9.7 % — ABNORMAL HIGH (ref 4.8–5.6)

## 2023-02-15 MED ORDER — SEMAGLUTIDE (2 MG/DOSE) 8 MG/3ML ~~LOC~~ SOPN: 2.0000 mg | PEN_INJECTOR | SUBCUTANEOUS | 1 refills | Status: DC

## 2023-02-15 MED ORDER — SEMAGLUTIDE (1 MG/DOSE) 4 MG/3ML ~~LOC~~ SOPN: 1.0000 mg | PEN_INJECTOR | SUBCUTANEOUS | 0 refills | Status: DC

## 2023-02-15 NOTE — Assessment & Plan Note (Signed)
Improved with A1c down to 9.7 from 12.2! Will increase her ozempic to 1mg  for 1 month then to 2mg  from there. Continue diet and exercise. Call with any concerns.

## 2023-02-15 NOTE — Progress Notes (Signed)
BP 129/82   Pulse 81   Temp 97.8 F (36.6 C) (Oral)   Wt 256 lb 3.2 oz (116.2 kg)   LMP 02/14/2023 (Approximate)   SpO2 98%   BMI 43.98 kg/m    Subjective:    Patient ID: Maria Reyes, female    DOB: May 06, 1991, 32 y.o.   MRN: [id number]  HPI: Maria Reyes is a 32 y.o. female  Chief Complaint  Patient presents with   Diabetes   Hyperlipidemia   Hypothyroidism   DIABETES Hypoglycemic episodes:no Polydipsia/polyuria: no Visual disturbance: no Chest pain: no Paresthesias: no Glucose Monitoring: yes  Accucheck frequency:  occasionally Taking Insulin?: no Blood Pressure Monitoring: not checking Retinal Examination: Not up to Date Foot Exam: Up to Date Diabetic Education: Completed Pneumovax: Up to Date Influenza: Not up to Date Aspirin: no   Relevant past medical, surgical, family and social history reviewed and updated as indicated. Interim medical history since our last visit reviewed. Allergies and medications reviewed and updated.  Review of Systems  Constitutional: Negative.   Respiratory: Negative.    Cardiovascular: Negative.   Gastrointestinal: Negative.   Musculoskeletal: Negative.   Neurological: Negative.   Psychiatric/Behavioral: Negative.      Per HPI unless specifically indicated above     Objective:    BP 129/82   Pulse 81   Temp 97.8 F (36.6 C) (Oral)   Wt 256 lb 3.2 oz (116.2 kg)   LMP 02/14/2023 (Approximate)   SpO2 98%   BMI 43.98 kg/m   Wt Readings from Last 3 Encounters:  02/15/23 256 lb 3.2 oz (116.2 kg)  11/08/22 279 lb 9.6 oz (126.8 kg)  09/14/21 249 lb 6.4 oz (113.1 kg)    Physical Exam Vitals and nursing note reviewed.  Constitutional:      General: She is not in acute distress.    Appearance: Normal appearance. She is obese. She is not ill-appearing, toxic-appearing or diaphoretic.  HENT:     Head: Normocephalic and atraumatic.     Right Ear: External ear normal.     Left Ear: External ear normal.     Nose:  Nose normal.     Mouth/Throat:     Mouth: Mucous membranes are moist.     Pharynx: Oropharynx is clear.  Eyes:     General: No scleral icterus.       Right eye: No discharge.        Left eye: No discharge.     Extraocular Movements: Extraocular movements intact.     Conjunctiva/sclera: Conjunctivae normal.     Pupils: Pupils are equal, round, and reactive to light.  Cardiovascular:     Rate and Rhythm: Normal rate and regular rhythm.     Pulses: Normal pulses.     Heart sounds: Normal heart sounds. No murmur heard.    No friction rub. No gallop.  Pulmonary:     Effort: Pulmonary effort is normal. No respiratory distress.     Breath sounds: Normal breath sounds. No stridor. No wheezing, rhonchi or rales.  Chest:     Chest wall: No tenderness.  Musculoskeletal:        General: Normal range of motion.     Cervical back: Normal range of motion and neck supple.  Skin:    General: Skin is warm and dry.     Capillary Refill: Capillary refill takes less than 2 seconds.     Coloration: Skin is not jaundiced or pale.     Findings: No bruising, erythema,  lesion or rash.  Neurological:     General: No focal deficit present.     Mental Status: She is alert and oriented to person, place, and time. Mental status is at baseline.  Psychiatric:        Mood and Affect: Mood normal.        Behavior: Behavior normal.        Thought Content: Thought content normal.        Judgment: Judgment normal.     Results for orders placed or performed in visit on 11/08/22  Bayer DCA Hb A1c Waived  Result Value Ref Range   HB A1C (BAYER DCA - WAIVED) 12.2 (H) 4.8 - 5.6 %  CBC with Differential/Platelet  Result Value Ref Range   WBC 9.8 3.4 - 10.8 x10E3/uL   RBC 4.98 3.77 - 5.28 x10E6/uL   Hemoglobin 13.1 11.1 - 15.9 g/dL   Hematocrit 36.6 44.0 - 46.6 %   MCV 84 79 - 97 fL   MCH 26.3 (L) 26.6 - 33.0 pg   MCHC 31.5 31.5 - 35.7 g/dL   RDW 34.7 42.5 - 95.6 %   Platelets 268 150 - 450 x10E3/uL    Neutrophils 64 Not Estab. %   Lymphs 30 Not Estab. %   Monocytes 5 Not Estab. %   Eos 1 Not Estab. %   Basos 0 Not Estab. %   Neutrophils Absolute 6.2 1.4 - 7.0 x10E3/uL   Lymphocytes Absolute 2.9 0.7 - 3.1 x10E3/uL   Monocytes Absolute 0.5 0.1 - 0.9 x10E3/uL   EOS (ABSOLUTE) 0.1 0.0 - 0.4 x10E3/uL   Basophils Absolute 0.0 0.0 - 0.2 x10E3/uL   Immature Granulocytes 0 Not Estab. %   Immature Grans (Abs) 0.0 0.0 - 0.1 x10E3/uL  Comprehensive metabolic panel  Result Value Ref Range   Glucose 416 (H) 70 - 99 mg/dL   BUN 12 6 - 20 mg/dL   Creatinine, Ser 3.87 0.57 - 1.00 mg/dL   eGFR 564 >33 IR/JJO/8.41   BUN/Creatinine Ratio 20 9 - 23   Sodium 135 134 - 144 mmol/L   Potassium 3.9 3.5 - 5.2 mmol/L   Chloride 98 96 - 106 mmol/L   CO2 15 (L) 20 - 29 mmol/L   Calcium 9.4 8.7 - 10.2 mg/dL   Total Protein 6.8 6.0 - 8.5 g/dL   Albumin 4.1 4.0 - 5.0 g/dL   Globulin, Total 2.7 1.5 - 4.5 g/dL   Albumin/Globulin Ratio 1.5 1.2 - 2.2   Bilirubin Total 0.3 0.0 - 1.2 mg/dL   Alkaline Phosphatase 111 44 - 121 IU/L   AST 34 0 - 40 IU/L   ALT 42 (H) 0 - 32 IU/L  Microalbumin, Urine Waived  Result Value Ref Range   Microalb, Ur Waived 80 (H) 0 - 19 mg/L   Creatinine, Urine Waived 50 10 - 300 mg/dL   Microalb/Creat Ratio 30-300 (H) <30 mg/g  Lipid Panel w/o Chol/HDL Ratio  Result Value Ref Range   Cholesterol, Total 241 (H) 100 - 199 mg/dL   Triglycerides 660 (H) 0 - 149 mg/dL   HDL 51 >63 mg/dL   VLDL Cholesterol Cal 91 (H) 5 - 40 mg/dL   LDL Chol Calc (NIH) 99 0 - 99 mg/dL  TSH  Result Value Ref Range   TSH 3.600 0.450 - 4.500 uIU/mL  Urinalysis, Routine w reflex microscopic  Result Value Ref Range   Specific Gravity, UA 1.010 1.005 - 1.030   pH, UA 5.0 5.0 - 7.5  Color, UA Yellow Yellow   Appearance Ur Clear Clear   Leukocytes,UA Negative Negative   Protein,UA Negative Negative/Trace   Glucose, UA 2+ (A) Negative   Ketones, UA Negative Negative   RBC, UA Negative Negative    Bilirubin, UA Negative Negative   Urobilinogen, Ur 0.2 0.2 - 1.0 mg/dL   Nitrite, UA Negative Negative   Microscopic Examination Comment       Assessment & Plan:   Problem List Items Addressed This Visit       Endocrine   Type 2 diabetes mellitus with renal complication (HCC) - Primary    Improved with A1c down to 9.7 from 12.2! Will increase her ozempic to 1mg  for 1 month then to 2mg  from there. Continue diet and exercise. Call with any concerns.       Relevant Medications   Semaglutide, 1 MG/DOSE, 4 MG/3ML SOPN   Semaglutide, 2 MG/DOSE, 8 MG/3ML SOPN (Start on 03/18/2023)   Other Relevant Orders   Bayer DCA Hb A1c Waived     Follow up plan: Return before the end of September please.

## 2023-02-24 ENCOUNTER — Encounter: Payer: Self-pay | Admitting: Family Medicine

## 2023-02-28 ENCOUNTER — Other Ambulatory Visit: Payer: Self-pay | Admitting: Family Medicine

## 2023-02-28 MED ORDER — OZEMPIC (0.25 OR 0.5 MG/DOSE) 2 MG/3ML ~~LOC~~ SOPN: 0.5000 mg | PEN_INJECTOR | SUBCUTANEOUS | 2 refills | Status: DC

## 2023-03-13 ENCOUNTER — Other Ambulatory Visit: Payer: Self-pay | Admitting: Family Medicine

## 2023-03-15 NOTE — Telephone Encounter (Signed)
Requested Prescriptions  Pending Prescriptions Disp Refills   JARDIANCE 25 MG TABS tablet [Pharmacy Med Name: JARDIANCE 25MG  TABLETS] 90 tablet 0    Sig: TAKE 1 TABLET(25 MG) BY MOUTH DAILY BEFORE BREAKFAST     Endocrinology:  Diabetes - SGLT2 Inhibitors Failed - 03/13/2023  3:35 AM      Failed - HBA1C is between 0 and 7.9 and within 180 days    HB A1C (BAYER DCA - WAIVED)  Date Value Ref Range Status  02/15/2023 9.7 (H) 4.8 - 5.6 % Final    Comment:             Prediabetes: 5.7 - 6.4          Diabetes: >6.4          Glycemic control for adults with diabetes: <7.0          Passed - Cr in normal range and within 360 days    Creatinine, Ser  Date Value Ref Range Status  11/08/2022 0.60 0.57 - 1.00 mg/dL Final         Passed - eGFR in normal range and within 360 days    GFR calc Af Amer  Date Value Ref Range Status  07/01/2020 153 >59 mL/min/1.73 Final    Comment:    **In accordance with recommendations from the NKF-ASN Task force,**   [facility] is in the process of updating its eGFR calculation to the   2021 CKD-EPI creatinine equation that estimates kidney function   without a race variable.    GFR calc non Af Amer  Date Value Ref Range Status  07/01/2020 133 >59 mL/min/1.73 Final   eGFR  Date Value Ref Range Status  11/08/2022 124 >59 mL/min/1.73 Final         Passed - Valid encounter within last 6 months    Recent Outpatient Visits           4 weeks ago Type 2 diabetes 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, without long-term current use of insulin (HCC)   [place] [facility] [place], Megan P, DO   4 months ago Hyperlipidemia associated with type 2 diabetes mellitus (HCC)   [place] [facility] [place], Megan P, DO   1 year ago Encounter for gynecological examination without abnormal finding   [place] [facility] Dorcas Carrow, DO   1 year ago Routine general medical examination at a health care facility    [facility] [place], [place] P, DO   1 year ago Hypothyroidism, unspecified type   [place] [facility] Dorcas Carrow, DO       Future Appointments             In 1 week Laural Benes, Oralia Rud, DO  [facility]

## 2023-03-28 ENCOUNTER — Ambulatory Visit: Payer: Medicaid Other | Admitting: Family Medicine

## 2023-05-06 ENCOUNTER — Other Ambulatory Visit: Payer: Self-pay | Admitting: Family Medicine

## 2023-05-06 NOTE — Telephone Encounter (Signed)
Requested Prescriptions  Pending Prescriptions Disp Refills   lisinopril (ZESTRIL) 5 MG tablet [Pharmacy Med Name: LISINOPRIL 5MG  TABLETS] 90 tablet 1    Sig: TAKE 1 TABLET(5 MG) BY MOUTH DAILY     Cardiovascular:  ACE Inhibitors Passed - 05/06/2023  3:36 AM      Passed - Cr in normal range and within 180 days    Creatinine, Ser  Date Value Ref Range Status  11/08/2022 0.60 0.57 - 1.00 mg/dL Final         Passed - K in normal range and within 180 days    Potassium  Date Value Ref Range Status  11/08/2022 3.9 3.5 - 5.2 mmol/L Final         Passed - Patient is not pregnant      Passed - Last BP in normal range    BP Readings from Last 1 Encounters:  02/15/23 129/82         Passed - Valid encounter within last 6 months    Recent Outpatient Visits           2 months ago Type 2 diabetes 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, without long-term current use of insulin (HCC)   [place] [facility] [place], Megan P, DO   5 months ago Hyperlipidemia associated with type 2 diabetes mellitus (HCC)   [place] [facility] [place], [facility] P, DO   1 year ago Encounter for gynecological examination without abnormal finding   [place] [facility] [place], Megan P, DO   1 year ago Routine general medical examination at a health care facility   [facility] [place], [place] P, DO   2 years ago Hypothyroidism, unspecified type   [place] [facility] [place], Oralia Rud, DO       Future Appointments             In 6 days Laural Benes, Oralia Rud, DO [facility]             atorvastatin (LIPITOR) 80 MG tablet [Pharmacy Med Name: ATORVASTATIN 80MG  TABLETS] 90 tablet 1    Sig: TAKE 1 TABLET(80 MG) BY MOUTH DAILY AT 6 PM     Cardiovascular:  Antilipid - Statins Failed - 05/06/2023  3:36 AM      Failed - Lipid Panel in normal range within the last 12 months    Cholesterol,  Total  Date Value Ref Range Status  11/08/2022 241 (H) 100 - 199 mg/dL Final   LDL Chol Calc (NIH)  Date Value Ref Range Status  11/08/2022 99 0 - 99 mg/dL Final   HDL  Date Value Ref Range Status  11/08/2022 51 >39 mg/dL Final   Triglycerides  Date Value Ref Range Status  11/08/2022 541 (H) 0 - 149 mg/dL Final         Passed - Patient is not pregnant      Passed - Valid encounter within last 12 months    Recent Outpatient Visits           2 months ago Type 2 diabetes 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, without long-term current use of insulin (HCC)   [place] [facility] [place], Megan P, DO   5 months ago Hyperlipidemia associated with type 2 diabetes mellitus [facility])   [place] [facility] [place], Megan P, DO   1 year ago Encounter for gynecological examination without abnormal finding  [place] [facility] [place], [place] P, DO   1 year ago Routine general medical examination at a health care facility   [place] [facility] [place], [place] P, DO   2 years ago Hypothyroidism, unspecified type   [place] [facility] Dorcas Carrow, DO       Future Appointments             In 6 days Dorcas Carrow, DO [place] [facility], [facility]

## 2023-05-10 ENCOUNTER — Ambulatory Visit: Payer: Medicaid Other | Admitting: Family Medicine

## 2023-05-12 ENCOUNTER — Ambulatory Visit: Payer: Medicaid Other | Admitting: Family Medicine

## 2023-05-12 ENCOUNTER — Encounter: Payer: Self-pay | Admitting: Family Medicine

## 2023-05-12 VITALS — BP 111/76 | HR 96 | Wt 240.6 lb

## 2023-05-12 DIAGNOSIS — Z7984 Long term (current) use of oral hypoglycemic drugs: Secondary | ICD-10-CM

## 2023-05-12 DIAGNOSIS — I129 Hypertensive chronic kidney disease with stage 1 through stage 4 chronic kidney disease, or unspecified chronic kidney disease: Secondary | ICD-10-CM

## 2023-05-12 DIAGNOSIS — H538 Other visual disturbances: Secondary | ICD-10-CM | POA: Diagnosis not present

## 2023-05-12 DIAGNOSIS — E039 Hypothyroidism, unspecified: Secondary | ICD-10-CM

## 2023-05-12 DIAGNOSIS — E1169 Type 2 diabetes mellitus with other specified complication: Secondary | ICD-10-CM | POA: Diagnosis not present

## 2023-05-12 DIAGNOSIS — E1122 Type 2 diabetes mellitus with diabetic chronic kidney disease: Secondary | ICD-10-CM

## 2023-05-12 DIAGNOSIS — E785 Hyperlipidemia, unspecified: Secondary | ICD-10-CM

## 2023-05-12 DIAGNOSIS — N182 Chronic kidney disease, stage 2 (mild): Secondary | ICD-10-CM

## 2023-05-12 LAB — MICROALBUMIN, URINE WAIVED
Creatinine, Urine Waived: 50 mg/dL (ref 10–300)
Microalb, Ur Waived: 10 mg/L (ref 0–19)

## 2023-05-12 LAB — BAYER DCA HB A1C WAIVED: HB A1C (BAYER DCA - WAIVED): 7.5 % — ABNORMAL HIGH (ref 4.8–5.6)

## 2023-05-12 MED ORDER — METFORMIN HCL ER 500 MG PO TB24: ORAL_TABLET | ORAL | 1 refills | Status: DC

## 2023-05-12 MED ORDER — EMPAGLIFLOZIN 25 MG PO TABS: 25.0000 mg | ORAL_TABLET | Freq: Every day | ORAL | 1 refills | Status: DC

## 2023-05-13 ENCOUNTER — Other Ambulatory Visit: Payer: Self-pay | Admitting: Family Medicine

## 2023-05-13 LAB — LIPID PANEL W/O CHOL/HDL RATIO
Cholesterol, Total: 144 mg/dL (ref 100–199)
HDL: 49 mg/dL (ref >39)
LDL Chol Calc (NIH): 73 mg/dL (ref 0–99)
Triglycerides: 125 mg/dL (ref 0–149)
VLDL Cholesterol Cal: 22 mg/dL (ref 5–40)

## 2023-05-13 LAB — TSH: TSH: 2.32 u[IU]/mL (ref 0.450–4.500)

## 2023-05-13 LAB — CBC WITH DIFFERENTIAL/PLATELET
Basophils Absolute: 0 10*3/uL (ref 0.0–0.2)
Basos: 0 %
EOS (ABSOLUTE): 0.1 10*3/uL (ref 0.0–0.4)
Eos: 1 %
Hematocrit: 44.2 % (ref 34.0–46.6)
Hemoglobin: 14 g/dL (ref 11.1–15.9)
Immature Grans (Abs): 0 10*3/uL (ref 0.0–0.1)
Immature Granulocytes: 0 %
Lymphocytes Absolute: 3.3 10*3/uL — ABNORMAL HIGH (ref 0.7–3.1)
Lymphs: 32 %
MCH: 26.1 pg — ABNORMAL LOW (ref 26.6–33.0)
MCHC: 31.7 g/dL (ref 31.5–35.7)
MCV: 82 fL (ref 79–97)
Monocytes Absolute: 0.4 10*3/uL (ref 0.1–0.9)
Monocytes: 4 %
Neutrophils Absolute: 6.5 10*3/uL (ref 1.4–7.0)
Neutrophils: 63 %
Platelets: 330 10*3/uL (ref 150–450)
RBC: 5.37 x10E6/uL — ABNORMAL HIGH (ref 3.77–5.28)
RDW: 13.6 % (ref 11.7–15.4)
WBC: 10.5 10*3/uL (ref 3.4–10.8)

## 2023-05-13 LAB — COMPREHENSIVE METABOLIC PANEL
ALT: 31 [IU]/L (ref 0–32)
AST: 18 [IU]/L (ref 0–40)
Albumin: 4.5 g/dL (ref 4.0–5.0)
Alkaline Phosphatase: 111 [IU]/L (ref 44–121)
BUN/Creatinine Ratio: 13 (ref 9–23)
BUN: 8 mg/dL (ref 6–20)
Bilirubin Total: 0.3 mg/dL (ref 0.0–1.2)
CO2: 21 mmol/L (ref 20–29)
Calcium: 9.9 mg/dL (ref 8.7–10.2)
Chloride: 103 mmol/L (ref 96–106)
Creatinine, Ser: 0.6 mg/dL (ref 0.57–1.00)
Globulin, Total: 3 g/dL (ref 1.5–4.5)
Glucose: 165 mg/dL — ABNORMAL HIGH (ref 70–99)
Potassium: 4.2 mmol/L (ref 3.5–5.2)
Sodium: 141 mmol/L (ref 134–144)
Total Protein: 7.5 g/dL (ref 6.0–8.5)
eGFR: 124 mL/min/{1.73_m2} (ref >59)

## 2023-05-13 MED ORDER — LEVOTHYROXINE SODIUM 25 MCG PO TABS: ORAL_TABLET | ORAL | 3 refills | Status: DC

## 2023-05-15 ENCOUNTER — Other Ambulatory Visit: Payer: Self-pay | Admitting: Family Medicine

## 2023-05-15 ENCOUNTER — Encounter: Payer: Self-pay | Admitting: Family Medicine

## 2023-05-15 NOTE — Assessment & Plan Note (Signed)
Doing well with A1c of 7.5. Tolerating her medicine well. Will continue current regimen and recheck in 3 months. Call with any concerns.

## 2023-05-15 NOTE — Assessment & Plan Note (Signed)
Under good control on current regimen. Continue current regimen. Continue to monitor. Call with any concerns. Refills given. Labs drawn today.   

## 2023-05-15 NOTE — Assessment & Plan Note (Signed)
Labs drawn today. Await results. Treat as needed.  

## 2023-05-16 DIAGNOSIS — H5213 Myopia, bilateral: Secondary | ICD-10-CM | POA: Diagnosis not present

## 2023-05-16 DIAGNOSIS — E113293 Type 2 diabetes mellitus with mild nonproliferative diabetic retinopathy without macular edema, bilateral: Secondary | ICD-10-CM | POA: Diagnosis not present

## 2023-05-16 LAB — HM DIABETES EYE EXAM

## 2023-05-16 NOTE — Telephone Encounter (Signed)
Reordered 11/124 # 90/3 RF   Requested Prescriptions  Refused Prescriptions Disp Refills   levothyroxine (SYNTHROID) 25 MCG tablet [Pharmacy Med Name: LEVOTHYROXINE 0.025MG  ( ) TAB] 90 tablet 3    Sig: TAKE 1 TABLET BY MOUTH DAILY BEFORE BREAKFAST     Endocrinology:  Hypothyroid Agents Passed - 05/15/2023  3:34 AM      Passed - TSH in normal range and within 360 days    TSH  Date Value Ref Range Status  05/12/2023 2.320 0.450 - 4.500 uIU/mL Final         Passed - Valid encounter within last 12 months    Recent Outpatient Visits           4 days ago Type 2 diabetes 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, without long-term current use of insulin (HCC)   [place] [facility] [place], Megan P, DO   3 months ago Type 2 diabetes 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, without long-term current use of insulin (HCC)   [place] [facility] [place], Megan P, DO   6 months ago Hyperlipidemia associated with type 2 diabetes mellitus (HCC)   [place] [facility] [place], Megan P, DO   1 year ago Encounter for gynecological examination without abnormal finding   [place] [facility] Dorcas Carrow, DO   1 year ago Routine general medical examination at a health care facility   [facility] Dorcas Carrow, DO       Future Appointments             In 2 months Dorcas Carrow, DO [place] [facility], [facility]

## 2023-05-20 ENCOUNTER — Other Ambulatory Visit: Payer: Self-pay | Admitting: Family Medicine

## 2023-05-20 NOTE — Telephone Encounter (Signed)
Requested Prescriptions  Refused Prescriptions Disp Refills   metFORMIN (GLUCOPHAGE-XR) 500 MG 24 hr tablet [Pharmacy Med Name: METFORMIN ER 500MG  24HR TABS] 360 tablet 1    Sig: TAKE 2 TABLETS(1000 MG) BY MOUTH TWICE DAILY     Endocrinology:  Diabetes - Biguanides Failed - 05/20/2023  3:34 AM      Failed - B12 Level in normal range and within 720 days    No results found for: "VITAMINB12"       Failed - CBC within normal limits and completed in the last 12 months    WBC  Date Value Ref Range Status  05/12/2023 10.5 3.4 - 10.8 x10E3/uL Final  11/30/2016 14.9 (H) 3.6 - 11.0 K/uL Final   RBC  Date Value Ref Range Status  05/12/2023 5.37 (H) 3.77 - 5.28 x10E6/uL Final  11/30/2016 4.86 3.80 - 5.20 MIL/uL Final   Hemoglobin  Date Value Ref Range Status  05/12/2023 14.0 11.1 - 15.9 g/dL Final   Hematocrit  Date Value Ref Range Status  05/12/2023 44.2 34.0 - 46.6 % Final   MCHC  Date Value Ref Range Status  05/12/2023 31.7 31.5 - 35.7 g/dL Final  95/18/8416 60.6 32.0 - 36.0 g/dL Final   [facility]  Date Value Ref Range Status  05/12/2023 26.1 (L) 26.6 - 33.0 pg Final  11/30/2016 25.4 (L) 26.0 - 34.0 pg Final   MCV  Date Value Ref Range Status  05/12/2023 82 79 - 97 fL Final   No results found for: "PLTCOUNTKUC", "LABPLAT", "POCPLA" RDW  Date Value Ref Range Status  05/12/2023 13.6 11.7 - 15.4 % Final         Passed - Cr in normal range and within 360 days    Creatinine, Ser  Date Value Ref Range Status  05/12/2023 0.60 0.57 - 1.00 mg/dL Final         Passed - HBA1C is between 0 and 7.9 and within 180 days    HB A1C (BAYER DCA - WAIVED)  Date Value Ref Range Status  05/12/2023 7.5 (H) 4.8 - 5.6 % Final    Comment:             Prediabetes: 5.7 - 6.4          Diabetes: >6.4          Glycemic control for adults with diabetes: <7.0          Passed - eGFR in normal range and within 360 days    GFR calc Af Amer  Date Value Ref Range Status  07/01/2020 153 >59  mL/min/1.73 Final    Comment:    **In accordance with recommendations from the NKF-ASN Task force,**   [facility] is in the process of updating its eGFR calculation to the   2021 CKD-EPI creatinine equation that estimates kidney function   without a race variable.    GFR calc non Af Amer  Date Value Ref Range Status  07/01/2020 133 >59 mL/min/1.73 Final   eGFR  Date Value Ref Range Status  05/12/2023 124 >59 mL/min/1.73 Final         Passed - Valid encounter within last 6 months    Recent Outpatient Visits           1 week ago Type 2 diabetes 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, without long-term current use of insulin (HCC)   [place] [facility] [place], Megan P, DO   3 months ago Type 2 diabetes mellitus with stage 2  chronic kidney disease, without long-term current use of insulin (HCC)   [place] [facility] [place], [place] P, DO   6 months ago Hyperlipidemia associated with type 2 diabetes mellitus [facility])   [place] [facility] [place], Megan P, DO   1 year ago Encounter for gynecological examination without abnormal finding   [place] [facility] Dorcas Carrow, DO   1 year ago Routine general medical examination at a health care facility   [facility] Dorcas Carrow, DO       Future Appointments             In 2 months Dorcas Carrow, DO  [facility], [facility]

## 2023-06-03 NOTE — Telephone Encounter (Signed)
DM Eye exam abstracted and in [facility] 05/16/23

## 2023-08-11 ENCOUNTER — Encounter: Payer: Medicaid Other | Admitting: Family Medicine

## 2023-09-12 ENCOUNTER — Encounter: Payer: Self-pay | Admitting: Family Medicine

## 2023-09-19 ENCOUNTER — Encounter: Admitting: Family Medicine

## 2023-10-18 ENCOUNTER — Ambulatory Visit (INDEPENDENT_AMBULATORY_CARE_PROVIDER_SITE_OTHER): Admitting: Family Medicine

## 2023-10-18 ENCOUNTER — Encounter: Payer: Self-pay | Admitting: Family Medicine

## 2023-10-18 VITALS — BP 129/82 | HR 79 | Temp 97.9°F | Ht 63.0 in | Wt 238.6 lb

## 2023-10-18 DIAGNOSIS — N182 Chronic kidney disease, stage 2 (mild): Secondary | ICD-10-CM | POA: Diagnosis not present

## 2023-10-18 DIAGNOSIS — Z0000 Encounter for general adult medical examination without abnormal findings: Secondary | ICD-10-CM

## 2023-10-18 DIAGNOSIS — Z7985 Long-term (current) use of injectable non-insulin antidiabetic drugs: Secondary | ICD-10-CM | POA: Diagnosis not present

## 2023-10-18 DIAGNOSIS — E1122 Type 2 diabetes mellitus with diabetic chronic kidney disease: Secondary | ICD-10-CM | POA: Diagnosis not present

## 2023-10-18 LAB — MICROALBUMIN, URINE WAIVED
Creatinine, Urine Waived: 50 mg/dL (ref 10–300)
Microalb, Ur Waived: 10 mg/L (ref 0–19)

## 2023-10-18 LAB — BAYER DCA HB A1C WAIVED: HB A1C (BAYER DCA - WAIVED): 7.8 % — ABNORMAL HIGH (ref 4.8–5.6)

## 2023-10-18 MED ORDER — SEMAGLUTIDE (2 MG/DOSE) 8 MG/3ML ~~LOC~~ SOPN: 2.0000 mg | PEN_INJECTOR | SUBCUTANEOUS | 1 refills | Status: DC

## 2023-10-18 MED ORDER — ATORVASTATIN CALCIUM 80 MG PO TABS: ORAL_TABLET | ORAL | 1 refills | Status: DC

## 2023-10-18 MED ORDER — NORETHINDRONE 0.35 MG PO TABS
ORAL_TABLET | ORAL | 3 refills | Status: AC
Start: 2023-10-18 — End: ?

## 2023-10-18 MED ORDER — EMPAGLIFLOZIN 25 MG PO TABS
25.0000 mg | ORAL_TABLET | Freq: Every day | ORAL | 1 refills | Status: DC
Start: 2023-10-18 — End: 2024-05-29

## 2023-10-18 MED ORDER — LISINOPRIL 5 MG PO TABS: 5.0000 mg | ORAL_TABLET | Freq: Every day | ORAL | 1 refills | Status: DC

## 2023-10-18 MED ORDER — METFORMIN HCL ER 500 MG PO TB24: ORAL_TABLET | ORAL | 1 refills | Status: DC

## 2023-10-18 NOTE — Assessment & Plan Note (Signed)
 Up slightly with A1c of 7.8 up from 7.5. Continue current regimen and work on diet. Recheck 3 months.

## 2023-10-18 NOTE — Progress Notes (Signed)
 BP 129/82 (BP Location: Left Arm, Patient Position: Sitting, Cuff Size: Large)   Pulse 79   Temp 97.9 F (36.6 C) (Oral)   Ht 5\' 3"  (1.6 m)   Wt 238 lb 9.6 oz (108.2 kg)   PF 99 L/min   BMI 42.27 kg/m    Subjective:    Patient ID: Maria Reyes, female    DOB: 11-28-1990, 33 y.o.   MRN: [id number]  HPI: Maria Reyes is a 33 y.o. female presenting on 10/18/2023 for comprehensive medical examination. Current medical complaints include:  DIABETES Hypoglycemic episodes:no Polydipsia/polyuria: no Visual disturbance: no Chest pain: no Paresthesias: no Glucose Monitoring: no  Accucheck frequency: Not Checking Taking Insulin?: no Blood Pressure Monitoring: not checking Retinal Examination: Up to Date Foot Exam: Up to Date Diabetic Education: Completed Pneumovax: Up to Date Influenza: Not up to Date Aspirin: no   She currently lives with: son and SO Menopausal Symptoms: no  Depression Screen done today and results listed below:     10/18/2023    3:55 PM 02/15/2023    2:00 PM 08/31/2021    1:10 PM 04/30/2021   11:01 AM 12/01/2020   10:08 AM  Depression screen PHQ 2/9  Decreased Interest 0 2 0 0 0  Down, Depressed, Hopeless 0 1 0 0 0  PHQ - 2 Score 0 3 0 0 0  Altered sleeping 0 3 1 1 1   Tired, decreased energy 0 3 0 1 0  Change in appetite 2 1 1 2 1   Feeling bad or failure about yourself  0 1 0 0 0  Trouble concentrating 0 0 0 0 0  Moving slowly or fidgety/restless 0 0 0 0 0  Suicidal thoughts 0 0 0 0 0  PHQ-9 Score 2 11 2 4 2   Difficult doing work/chores Not difficult at all Somewhat difficult       Past Medical History:  Past Medical History:  Diagnosis Date   Diabetes mellitus without complication (HCC)    Type Two   Hypertension    Hypothyroid    Ovarian cyst     Surgical History:  Past Surgical History:  Procedure Laterality Date   LAPAROSCOPIC OVARIAN CYSTECTOMY      Medications:  Current Outpatient Medications on File Prior to Visit  Medication  Sig   levothyroxine (SYNTHROID) 25 MCG tablet TAKE 1 TABLET BY MOUTH DAILY BEFORE BREAKFAST   fluticasone (FLONASE) 50 MCG/ACT nasal spray Place 2 sprays into both nostrils daily. (Patient not taking: Reported on 10/18/2023)   No current facility-administered medications on file prior to visit.    Allergies:  No Known Allergies  Social History:  Social History   Socioeconomic History   Marital status: Single    Spouse name: Not on file   Number of children: Not on file   Years of education: Not on file   Highest education level: Not on file  Occupational History   Not on file  Tobacco Use   Smoking status: Never   Smokeless tobacco: Never  Vaping Use   Vaping status: Never Used  Substance and Sexual Activity   Alcohol use: No   Drug use: No   Sexual activity: Yes    Birth control/protection: Pill  Other Topics Concern   Not on file  Social History Narrative   Not on file   Social Drivers of Health   Financial Resource Strain: Not on file  Food Insecurity: Not on file  Transportation Needs: Not on file  Physical Activity: Not  on file  Stress: Not on file  Social Connections: Not on file  Intimate Partner Violence: Not on file   Social History   Tobacco Use  Smoking Status Never  Smokeless Tobacco Never   Social History   Substance and Sexual Activity  Alcohol Use No    Family History:  Family History  Problem Relation Age of Onset   Endometriosis Mother    Ovarian cysts Mother    Diabetes Father    Heart disease Father    Diabetes Maternal Grandmother    Hypertension Maternal Grandmother    Cancer Maternal Grandmother        Breast   Diabetes Maternal Grandfather    Diabetes Paternal Grandmother    Diabetes Paternal Grandfather     Past medical history, surgical history, medications, allergies, family history and social history reviewed with patient today and changes made to appropriate areas of the chart.   Review of Systems  Constitutional:  Negative.   HENT: Negative.    Eyes:  Positive for blurred vision. Negative for double vision, photophobia, pain, discharge and redness.  Respiratory: Negative.    Cardiovascular:  Positive for leg swelling (with driving long distances). Negative for chest pain, palpitations, orthopnea, claudication and PND.  Gastrointestinal: Negative.   Genitourinary: Negative.   Musculoskeletal: Negative.   Skin: Negative.   Neurological: Negative.   Endo/Heme/Allergies: Negative.   Psychiatric/Behavioral: Negative.     All other ROS negative except what is listed above and in the HPI.      Objective:    BP 129/82 (BP Location: Left Arm, Patient Position: Sitting, Cuff Size: Large)   Pulse 79   Temp 97.9 F (36.6 C) (Oral)   Ht 5\' 3"  (1.6 m)   Wt 238 lb 9.6 oz (108.2 kg)   PF 99 L/min   BMI 42.27 kg/m   Wt Readings from Last 3 Encounters:  10/18/23 238 lb 9.6 oz (108.2 kg)  05/12/23 240 lb 9.6 oz (109.1 kg)  02/15/23 256 lb 3.2 oz (116.2 kg)    Physical Exam Vitals and nursing note reviewed.  Constitutional:      General: She is not in acute distress.    Appearance: Normal appearance. She is obese. She is not ill-appearing, toxic-appearing or diaphoretic.  HENT:     Head: Normocephalic and atraumatic.     Right Ear: Tympanic membrane, ear canal and external ear normal. There is no impacted cerumen.     Left Ear: Tympanic membrane, ear canal and external ear normal. There is no impacted cerumen.     Nose: Nose normal. No congestion or rhinorrhea.     Mouth/Throat:     Mouth: Mucous membranes are moist.     Pharynx: Oropharynx is clear. No oropharyngeal exudate or posterior oropharyngeal erythema.  Eyes:     General: No scleral icterus.       Right eye: No discharge.        Left eye: No discharge.     Extraocular Movements: Extraocular movements intact.     Conjunctiva/sclera: Conjunctivae normal.     Pupils: Pupils are equal, round, and reactive to light.  Neck:     Vascular: No  carotid bruit.  Cardiovascular:     Rate and Rhythm: Normal rate and regular rhythm.     Pulses: Normal pulses.     Heart sounds: No murmur heard.    No friction rub. No gallop.  Pulmonary:     Effort: Pulmonary effort is normal. No respiratory distress.  Breath sounds: Normal breath sounds. No stridor. No wheezing, rhonchi or rales.  Chest:     Chest wall: No tenderness.  Abdominal:     General: Abdomen is flat. Bowel sounds are normal. There is no distension.     Palpations: Abdomen is soft. There is no mass.     Tenderness: There is no abdominal tenderness. There is no right CVA tenderness, left CVA tenderness, guarding or rebound.     Hernia: No hernia is present.  Genitourinary:    Comments: Breast and pelvic exams deferred with shared decision making Musculoskeletal:        General: No swelling, tenderness, deformity or signs of injury.     Cervical back: Normal range of motion and neck supple. No rigidity. No muscular tenderness.     Right lower leg: No edema.     Left lower leg: No edema.  Lymphadenopathy:     Cervical: No cervical adenopathy.  Skin:    General: Skin is warm and dry.     Capillary Refill: Capillary refill takes less than 2 seconds.     Coloration: Skin is not jaundiced or pale.     Findings: No bruising, erythema, lesion or rash.  Neurological:     General: No focal deficit present.     Mental Status: She is alert and oriented to person, place, and time. Mental status is at baseline.     Cranial Nerves: No cranial nerve deficit.     Sensory: No sensory deficit.     Motor: No weakness.     Coordination: Coordination normal.     Gait: Gait normal.     Deep Tendon Reflexes: Reflexes normal.  Psychiatric:        Mood and Affect: Mood normal.        Behavior: Behavior normal.        Thought Content: Thought content normal.        Judgment: Judgment normal.     Results for orders placed or performed in visit on 06/01/23  HM DIABETES EYE EXAM    Collection Time: 05/16/23  2:21 PM  Result Value Ref Range   HM Diabetic Eye Exam No Retinopathy No Retinopathy      Assessment & Plan:   Problem List Items Addressed This Visit       Endocrine   Type 2 diabetes mellitus with renal complication (HCC)   Up slightly with A1c of 7.8 up from 7.5. Continue current regimen and work on diet. Recheck 3 months.       Relevant Medications   atorvastatin (LIPITOR) 80 MG tablet   empagliflozin (JARDIANCE) 25 MG TABS tablet   lisinopril (ZESTRIL) 5 MG tablet   metFORMIN (GLUCOPHAGE-XR) 500 MG 24 hr tablet   Semaglutide, 2 MG/DOSE, 8 MG/3ML SOPN   Other Relevant Orders   Bayer DCA Hb A1c Waived   Microalbumin, Urine Waived   Other Visit Diagnoses       Routine general medical examination at a health care facility    -  Primary   Vaccines up to date/declined. Screening labs checked today. Pap up to date. Continue diet and exercise. Call with any concerns.   Relevant Orders   CBC with Differential/Platelet   Comprehensive metabolic panel with GFR   Lipid Panel w/o Chol/HDL Ratio   TSH        Follow up plan: Return in about 3 months (around 01/17/2024).   LABORATORY TESTING:  - Pap smear: up to date  IMMUNIZATIONS:   -  Tdap: Tetanus vaccination status reviewed: last tetanus booster within 10 years. - Influenza: Refused - Pneumovax: Up to date - Prevnar: Refused - COVID: Refused - HPV: Refused - Shingrix vaccine: Not applicable  PATIENT COUNSELING:   Advised to take 1 mg of folate supplement per day if capable of pregnancy.   Sexuality: Discussed sexually transmitted diseases, partner selection, use of condoms, avoidance of unintended pregnancy  and contraceptive alternatives.   Advised to avoid cigarette smoking.  I discussed with the patient that most people either abstain from alcohol or drink within safe limits (<=14/week and <=4 drinks/occasion for males, <=7/weeks and <= 3 drinks/occasion for females) and that the risk  for alcohol disorders and other health effects rises proportionally with the number of drinks per week and how often a drinker exceeds daily limits.  Discussed cessation/primary prevention of drug use and availability of treatment for abuse.   Diet: Encouraged to adjust caloric intake to maintain  or achieve ideal body weight, to reduce intake of dietary saturated fat and total fat, to limit sodium intake by avoiding high sodium foods and not adding table salt, and to maintain adequate dietary potassium and calcium preferably from fresh fruits, vegetables, and low-fat dairy products.    stressed the importance of regular exercise  Injury prevention: Discussed safety belts, safety helmets, smoke detector, smoking near bedding or upholstery.   Dental health: Discussed importance of regular tooth brushing, flossing, and dental visits.    NEXT PREVENTATIVE PHYSICAL DUE IN 1 YEAR. Return in about 3 months (around 01/17/2024).

## 2023-10-19 LAB — COMPREHENSIVE METABOLIC PANEL WITH GFR
ALT: 26 IU/L (ref 0–32)
AST: 18 IU/L (ref 0–40)
Albumin: 4.5 g/dL (ref 4.0–5.0)
Alkaline Phosphatase: 103 IU/L (ref 44–121)
BUN/Creatinine Ratio: 17 (ref 9–23)
BUN: 10 mg/dL (ref 6–20)
Bilirubin Total: 0.5 mg/dL (ref 0.0–1.2)
CO2: 21 mmol/L (ref 20–29)
Calcium: 10.2 mg/dL (ref 8.7–10.2)
Chloride: 101 mmol/L (ref 96–106)
Creatinine, Ser: 0.6 mg/dL (ref 0.57–1.00)
Globulin, Total: 2.9 g/dL (ref 1.5–4.5)
Glucose: 140 mg/dL — ABNORMAL HIGH (ref 70–99)
Potassium: 4.3 mmol/L (ref 3.5–5.2)
Sodium: 140 mmol/L (ref 134–144)
Total Protein: 7.4 g/dL (ref 6.0–8.5)
eGFR: 124 mL/min/{1.73_m2} (ref >59)

## 2023-10-19 LAB — CBC WITH DIFFERENTIAL/PLATELET
Basophils Absolute: 0 10*3/uL (ref 0.0–0.2)
Basos: 0 %
EOS (ABSOLUTE): 0.1 10*3/uL (ref 0.0–0.4)
Eos: 1 %
Hematocrit: 45.9 % (ref 34.0–46.6)
Hemoglobin: 15 g/dL (ref 11.1–15.9)
Immature Grans (Abs): 0 10*3/uL (ref 0.0–0.1)
Immature Granulocytes: 0 %
Lymphocytes Absolute: 2.9 10*3/uL (ref 0.7–3.1)
Lymphs: 26 %
MCH: 27.3 pg (ref 26.6–33.0)
MCHC: 32.7 g/dL (ref 31.5–35.7)
MCV: 84 fL (ref 79–97)
Monocytes Absolute: 0.6 10*3/uL (ref 0.1–0.9)
Monocytes: 6 %
Neutrophils Absolute: 7.7 10*3/uL — ABNORMAL HIGH (ref 1.4–7.0)
Neutrophils: 67 %
Platelets: 320 10*3/uL (ref 150–450)
RBC: 5.5 x10E6/uL — ABNORMAL HIGH (ref 3.77–5.28)
RDW: 13.7 % (ref 11.7–15.4)
WBC: 11.4 10*3/uL — ABNORMAL HIGH (ref 3.4–10.8)

## 2023-10-19 LAB — LIPID PANEL W/O CHOL/HDL RATIO
Cholesterol, Total: 158 mg/dL (ref 100–199)
HDL: 58 mg/dL (ref >39)
LDL Chol Calc (NIH): 77 mg/dL (ref 0–99)
Triglycerides: 129 mg/dL (ref 0–149)
VLDL Cholesterol Cal: 23 mg/dL (ref 5–40)

## 2023-10-19 LAB — TSH: TSH: 2.15 u[IU]/mL (ref 0.450–4.500)

## 2023-10-20 ENCOUNTER — Encounter: Payer: Self-pay | Admitting: Family Medicine

## 2023-11-17 ENCOUNTER — Encounter: Payer: Self-pay | Admitting: Family Medicine

## 2023-12-27 ENCOUNTER — Encounter: Payer: Self-pay | Admitting: Family Medicine

## 2024-01-09 ENCOUNTER — Telehealth: Payer: Self-pay

## 2024-01-09 ENCOUNTER — Other Ambulatory Visit (HOSPITAL_COMMUNITY): Payer: Self-pay

## 2024-01-09 NOTE — Telephone Encounter (Signed)
 Pharmacy Patient Advocate Encounter   Received notification from CoverMyMeds that prior authorization for Jardiance  25MG  tablets is required/requested.   Insurance verification completed.   The patient is insured through [employer] .   Per test claim: PA required; PA submitted to above mentioned insurance via CoverMyMeds Key/confirmation #/EOC [id number] Status is pending

## 2024-01-10 ENCOUNTER — Other Ambulatory Visit (HOSPITAL_COMMUNITY): Payer: Self-pay

## 2024-01-11 NOTE — Telephone Encounter (Signed)
 Pharmacy Patient Advocate Encounter  Received notification from [facility] that Prior Authorization for Jardiance  25MG  tablets has been APPROVED from 01/09/2024 to 01/08/2025   PA #/Case ID/Reference #:  [id number]

## 2024-01-18 ENCOUNTER — Ambulatory Visit: Admitting: Family Medicine

## 2024-01-24 ENCOUNTER — Ambulatory Visit: Admitting: Family Medicine

## 2024-01-29 DIAGNOSIS — T887XXA Unspecified adverse effect of drug or medicament, initial encounter: Secondary | ICD-10-CM | POA: Diagnosis not present

## 2024-01-29 DIAGNOSIS — I10 Essential (primary) hypertension: Secondary | ICD-10-CM | POA: Diagnosis not present

## 2024-01-29 DIAGNOSIS — R4182 Altered mental status, unspecified: Secondary | ICD-10-CM | POA: Diagnosis not present

## 2024-01-29 DIAGNOSIS — R400 Somnolence: Secondary | ICD-10-CM | POA: Diagnosis not present

## 2024-01-29 DIAGNOSIS — F1210 Cannabis abuse, uncomplicated: Secondary | ICD-10-CM | POA: Diagnosis not present

## 2024-01-29 DIAGNOSIS — R0689 Other abnormalities of breathing: Secondary | ICD-10-CM | POA: Diagnosis not present

## 2024-01-29 DIAGNOSIS — R079 Chest pain, unspecified: Secondary | ICD-10-CM | POA: Diagnosis not present

## 2024-01-29 DIAGNOSIS — Z8632 Personal history of gestational diabetes: Secondary | ICD-10-CM | POA: Diagnosis not present

## 2024-01-29 DIAGNOSIS — T50994A Poisoning by other drugs, medicaments and biological substances, undetermined, initial encounter: Secondary | ICD-10-CM | POA: Diagnosis not present

## 2024-01-29 DIAGNOSIS — T50904A Poisoning by unspecified drugs, medicaments and biological substances, undetermined, initial encounter: Secondary | ICD-10-CM | POA: Diagnosis not present

## 2024-01-30 ENCOUNTER — Ambulatory Visit: Admitting: Family Medicine

## 2024-01-30 DIAGNOSIS — R4182 Altered mental status, unspecified: Secondary | ICD-10-CM | POA: Diagnosis not present

## 2024-02-24 ENCOUNTER — Encounter: Payer: Self-pay | Admitting: Family Medicine

## 2024-02-24 ENCOUNTER — Ambulatory Visit: Admitting: Family Medicine

## 2024-02-24 VITALS — BP 117/81 | HR 84 | Temp 97.7°F | Ht 63.0 in | Wt 223.8 lb

## 2024-02-24 DIAGNOSIS — N182 Chronic kidney disease, stage 2 (mild): Secondary | ICD-10-CM

## 2024-02-24 DIAGNOSIS — E1122 Type 2 diabetes mellitus with diabetic chronic kidney disease: Secondary | ICD-10-CM

## 2024-02-24 DIAGNOSIS — T40711D Poisoning by cannabis, accidental (unintentional), subsequent encounter: Secondary | ICD-10-CM

## 2024-02-24 LAB — BAYER DCA HB A1C WAIVED: HB A1C (BAYER DCA - WAIVED): 6.3 % — ABNORMAL HIGH (ref 4.8–5.6)

## 2024-02-24 NOTE — Assessment & Plan Note (Signed)
 Doing great with A1c of 6.3! Continue current regimen. Continue to monitor. Recheck 3 months. Call with any concerns.

## 2024-02-24 NOTE — Progress Notes (Signed)
 BP 117/81   Pulse 84   Temp 97.7 F (36.5 C) (Oral)   Ht 5' 3 (1.6 m)   Wt 223 lb 12.8 oz (101.5 kg)   SpO2 98%   BMI 39.64 kg/m    Subjective:    Patient ID: Maria Reyes, female    DOB: May 30, 1991, 33 y.o.   MRN: [id number]  HPI: Maria Reyes is a 33 y.o. female  Chief Complaint  Patient presents with   Diabetes   Follow-up    ED f/u 01/29/24    DIABETES Hypoglycemic episodes:no Polydipsia/polyuria: no Visual disturbance: no Chest pain: no Paresthesias: no Glucose Monitoring: yes  Accucheck frequency: Daily Taking Insulin ?: no Blood Pressure Monitoring: rarely Retinal Examination: Up to Date Foot Exam: Up to Date Diabetic Education: Completed Pneumovax: Declined Influenza: declined Aspirin: no  ER FOLLOW UP Time since discharge: 3 weeks Hospital/facility: [facility] Diagnosis: Canabis overdose Procedures/tests: labs Consultants: none New medications: none Discharge instructions: follow up here   Status: better  Relevant past medical, surgical, family and social history reviewed and updated as indicated. Interim medical history since our last visit reviewed. Allergies and medications reviewed and updated.  Review of Systems  Constitutional: Negative.   Respiratory: Negative.    Cardiovascular: Negative.   Musculoskeletal: Negative.   Neurological: Negative.   Psychiatric/Behavioral: Negative.      Per HPI unless specifically indicated above     Objective:    BP 117/81   Pulse 84   Temp 97.7 F (36.5 C) (Oral)   Ht 5' 3 (1.6 m)   Wt 223 lb 12.8 oz (101.5 kg)   SpO2 98%   BMI 39.64 kg/m   Wt Readings from Last 3 Encounters:  02/24/24 223 lb 12.8 oz (101.5 kg)  10/18/23 238 lb 9.6 oz (108.2 kg)  05/12/23 240 lb 9.6 oz (109.1 kg)    Physical Exam Vitals and nursing note reviewed.  Constitutional:      General: She is not in acute distress.    Appearance: Normal appearance. She is not ill-appearing, toxic-appearing or diaphoretic.   HENT:     Head: Normocephalic and atraumatic.     Right Ear: External ear normal.     Left Ear: External ear normal.     Nose: Nose normal.     Mouth/Throat:     Mouth: Mucous membranes are moist.     Pharynx: Oropharynx is clear.  Eyes:     General: No scleral icterus.       Right eye: No discharge.        Left eye: No discharge.     Extraocular Movements: Extraocular movements intact.     Conjunctiva/sclera: Conjunctivae normal.     Pupils: Pupils are equal, round, and reactive to light.  Cardiovascular:     Rate and Rhythm: Normal rate and regular rhythm.     Pulses: Normal pulses.     Heart sounds: Normal heart sounds. No murmur heard.    No friction rub. No gallop.  Pulmonary:     Effort: Pulmonary effort is normal. No respiratory distress.     Breath sounds: Normal breath sounds. No stridor. No wheezing, rhonchi or rales.  Chest:     Chest wall: No tenderness.  Musculoskeletal:        General: Normal range of motion.     Cervical back: Normal range of motion and neck supple.  Skin:    General: Skin is warm and dry.     Capillary Refill: Capillary refill takes  less than 2 seconds.     Coloration: Skin is not jaundiced or pale.     Findings: No bruising, erythema, lesion or rash.  Neurological:     General: No focal deficit present.     Mental Status: She is alert and oriented to person, place, and time. Mental status is at baseline.  Psychiatric:        Mood and Affect: Mood normal.        Behavior: Behavior normal.        Thought Content: Thought content normal.        Judgment: Judgment normal.     Results for orders placed or performed in visit on 10/18/23  Bayer DCA Hb A1c Waived   Collection Time: 10/18/23  3:54 PM  Result Value Ref Range   HB A1C (BAYER DCA - WAIVED) 7.8 (H) 4.8 - 5.6 %  Microalbumin, Urine Waived   Collection Time: 10/18/23  3:54 PM  Result Value Ref Range   Microalb, Ur Waived 10 0 - 19 mg/L   Creatinine, Urine Waived 50 10 - 300  mg/dL   Microalb/Creat Ratio 30-300 (H) <30 mg/g  CBC with Differential/Platelet   Collection Time: 10/18/23  3:56 PM  Result Value Ref Range   WBC 11.4 (H) 3.4 - 10.8 x10E3/uL   RBC 5.50 (H) 3.77 - 5.28 x10E6/uL   Hemoglobin 15.0 11.1 - 15.9 g/dL   Hematocrit 54.0 65.9 - 46.6 %   MCV 84 79 - 97 fL   MCH 27.3 26.6 - 33.0 pg   MCHC 32.7 31.5 - 35.7 g/dL   RDW 86.2 88.2 - 84.5 %   Platelets 320 150 - 450 x10E3/uL   Neutrophils 67 Not Estab. %   Lymphs 26 Not Estab. %   Monocytes 6 Not Estab. %   Eos 1 Not Estab. %   Basos 0 Not Estab. %   Neutrophils Absolute 7.7 (H) 1.4 - 7.0 x10E3/uL   Lymphocytes Absolute 2.9 0.7 - 3.1 x10E3/uL   Monocytes Absolute 0.6 0.1 - 0.9 x10E3/uL   EOS (ABSOLUTE) 0.1 0.0 - 0.4 x10E3/uL   Basophils Absolute 0.0 0.0 - 0.2 x10E3/uL   Immature Granulocytes 0 Not Estab. %   Immature Grans (Abs) 0.0 0.0 - 0.1 x10E3/uL  Comprehensive metabolic panel with GFR   Collection Time: 10/18/23  3:56 PM  Result Value Ref Range   Glucose 140 (H) 70 - 99 mg/dL   BUN 10 6 - 20 mg/dL   Creatinine, Ser 9.39 0.57 - 1.00 mg/dL   eGFR 875 >40 fO/fpw/8.26   BUN/Creatinine Ratio 17 9 - 23   Sodium 140 134 - 144 mmol/L   Potassium 4.3 3.5 - 5.2 mmol/L   Chloride 101 96 - 106 mmol/L   CO2 21 20 - 29 mmol/L   Calcium  10.2 8.7 - 10.2 mg/dL   Total Protein 7.4 6.0 - 8.5 g/dL   Albumin 4.5 4.0 - 5.0 g/dL   Globulin, Total 2.9 1.5 - 4.5 g/dL   Bilirubin Total 0.5 0.0 - 1.2 mg/dL   Alkaline Phosphatase 103 44 - 121 IU/L   AST 18 0 - 40 IU/L   ALT 26 0 - 32 IU/L  Lipid Panel w/o Chol/HDL Ratio   Collection Time: 10/18/23  3:56 PM  Result Value Ref Range   Cholesterol, Total 158 100 - 199 mg/dL   Triglycerides 870 0 - 149 mg/dL   HDL 58 >60 mg/dL   VLDL Cholesterol Cal 23 5 - 40 mg/dL  LDL Chol Calc (NIH) 77 0 - 99 mg/dL  TSH   Collection Time: 10/18/23  3:56 PM  Result Value Ref Range   TSH 2.150 0.450 - 4.500 uIU/mL      Assessment & Plan:   Problem List Items  Addressed This Visit       Endocrine   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 Continue current regimen. Continue to monitor. Recheck 3 months. Call with any concerns.       Relevant Orders   Bayer DCA Hb A1c Waived   Other Visit Diagnoses       Cannabis overdose, accidental or unintentional, subsequent encounter       Feeling entirely back to normal. Discussed role of GLP-1 in her symptoms. Call with any concerns.        Follow up plan: Return in about 3 months (around 05/26/2024).

## 2024-04-21 ENCOUNTER — Encounter: Payer: Self-pay | Admitting: Family Medicine

## 2024-04-30 MED ORDER — NYSTATIN 100000 UNIT/GM EX POWD: 1.0000 | Freq: Three times a day (TID) | CUTANEOUS | 0 refills | Status: AC

## 2024-05-12 ENCOUNTER — Encounter: Payer: Self-pay | Admitting: Family Medicine

## 2024-05-17 ENCOUNTER — Encounter: Payer: Self-pay | Admitting: Family Medicine

## 2024-05-20 MED ORDER — SEMAGLUTIDE (2 MG/DOSE) 8 MG/3ML ~~LOC~~ SOPN: 2.0000 mg | PEN_INJECTOR | SUBCUTANEOUS | 0 refills | Status: DC

## 2024-05-25 ENCOUNTER — Other Ambulatory Visit (HOSPITAL_COMMUNITY): Payer: Self-pay

## 2024-05-28 ENCOUNTER — Other Ambulatory Visit (HOSPITAL_COMMUNITY): Payer: Self-pay

## 2024-05-29 ENCOUNTER — Ambulatory Visit: Admitting: Family Medicine

## 2024-05-29 ENCOUNTER — Encounter: Payer: Self-pay | Admitting: Family Medicine

## 2024-05-29 VITALS — BP 104/74 | HR 76 | Temp 98.0°F | Ht 63.0 in | Wt 199.8 lb

## 2024-05-29 DIAGNOSIS — N182 Chronic kidney disease, stage 2 (mild): Secondary | ICD-10-CM

## 2024-05-29 DIAGNOSIS — E1169 Type 2 diabetes mellitus with other specified complication: Secondary | ICD-10-CM | POA: Diagnosis not present

## 2024-05-29 DIAGNOSIS — E1122 Type 2 diabetes mellitus with diabetic chronic kidney disease: Secondary | ICD-10-CM | POA: Diagnosis not present

## 2024-05-29 DIAGNOSIS — E785 Hyperlipidemia, unspecified: Secondary | ICD-10-CM

## 2024-05-29 DIAGNOSIS — Z7985 Long-term (current) use of injectable non-insulin antidiabetic drugs: Secondary | ICD-10-CM | POA: Diagnosis not present

## 2024-05-29 DIAGNOSIS — E039 Hypothyroidism, unspecified: Secondary | ICD-10-CM

## 2024-05-29 DIAGNOSIS — I129 Hypertensive chronic kidney disease with stage 1 through stage 4 chronic kidney disease, or unspecified chronic kidney disease: Secondary | ICD-10-CM

## 2024-05-29 LAB — BAYER DCA HB A1C WAIVED: HB A1C (BAYER DCA - WAIVED): 5.7 % — ABNORMAL HIGH (ref 4.8–5.6)

## 2024-05-29 MED ORDER — EMPAGLIFLOZIN 25 MG PO TABS: 25.0000 mg | ORAL_TABLET | Freq: Every day | ORAL | 1 refills | Status: AC

## 2024-05-29 MED ORDER — SEMAGLUTIDE (2 MG/DOSE) 8 MG/3ML ~~LOC~~ SOPN: 2.0000 mg | PEN_INJECTOR | SUBCUTANEOUS | 1 refills | Status: AC

## 2024-05-29 MED ORDER — LISINOPRIL 2.5 MG PO TABS: 2.5000 mg | ORAL_TABLET | Freq: Every day | ORAL | 1 refills | Status: AC

## 2024-05-29 MED ORDER — METFORMIN HCL ER 500 MG PO TB24: ORAL_TABLET | ORAL | 1 refills | Status: AC

## 2024-05-29 MED ORDER — ATORVASTATIN CALCIUM 80 MG PO TABS: ORAL_TABLET | ORAL | 1 refills | Status: AC

## 2024-05-29 NOTE — Assessment & Plan Note (Signed)
 Rechecking labs today. Await results. Treat as needed.

## 2024-05-29 NOTE — Progress Notes (Signed)
 BP 104/74   Pulse 76   Temp 98 F (36.7 C) (Oral)   Ht 5' 3 (1.6 m)   Wt 199 lb 12.8 oz (90.6 kg)   SpO2 98%   BMI 35.39 kg/m    Subjective:    Patient ID: Maria Reyes, female    DOB: 01-22-1991, 33 y.o.   MRN: [id number]  HPI: Maria Reyes is a 33 y.o. female  Chief Complaint  Patient presents with   Diabetes   DIABETES Hypoglycemic episodes:no Polydipsia/polyuria: no Visual disturbance: no Chest pain: no Paresthesias: no Glucose Monitoring: yes- occasionally Taking Insulin ?: no Blood Pressure Monitoring: rarely Retinal Examination: Up to Date Foot Exam: Up to Date Diabetic Education: Completed Pneumovax: Up to Date Influenza: Not up to Date Aspirin: no  HYPERTENSION / HYPERLIPIDEMIA Satisfied with current treatment? yes Duration of hypertension: chronic BP monitoring frequency: rarely BP medication side effects: no Past BP meds: lisinopril  Duration of hyperlipidemia: chronic Cholesterol medication side effects: no Cholesterol supplements: none Past cholesterol medications: atorvastatin  Medication compliance: excellent compliance Aspirin: no Recent stressors: no Recurrent headaches: no Visual changes: no Palpitations: no Dyspnea: no Chest pain: no Lower extremity edema: no Dizzy/lightheaded: no  HYPOTHYROIDISM Thyroid  control status:controlled Satisfied with current treatment? yes Medication side effects: no Medication compliance: excellent compliance Recent dose adjustment:no Fatigue: no Cold intolerance: no Heat intolerance: no Weight gain: no Weight loss: no Constipation: no Diarrhea/loose stools: no Palpitations: no Lower extremity edema: no Anxiety/depressed mood: no    Relevant past medical, surgical, family and social history reviewed and updated as indicated. Interim medical history since our last visit reviewed. Allergies and medications reviewed and updated.  Review of Systems  Constitutional: Negative.   Respiratory:  Negative.    Cardiovascular: Negative.   Musculoskeletal: Negative.   Neurological: Negative.     Per HPI unless specifically indicated above     Objective:    BP 104/74   Pulse 76   Temp 98 F (36.7 C) (Oral)   Ht 5' 3 (1.6 m)   Wt 199 lb 12.8 oz (90.6 kg)   SpO2 98%   BMI 35.39 kg/m   Wt Readings from Last 3 Encounters:  05/29/24 199 lb 12.8 oz (90.6 kg)  02/24/24 223 lb 12.8 oz (101.5 kg)  10/18/23 238 lb 9.6 oz (108.2 kg)    Physical Exam Vitals and nursing note reviewed.  Constitutional:      General: She is not in acute distress.    Appearance: Normal appearance. She is not ill-appearing, toxic-appearing or diaphoretic.  HENT:     Head: Normocephalic and atraumatic.     Right Ear: External ear normal.     Left Ear: External ear normal.     Nose: Nose normal.     Mouth/Throat:     Mouth: Mucous membranes are moist.     Pharynx: Oropharynx is clear.  Eyes:     General: No scleral icterus.       Right eye: No discharge.        Left eye: No discharge.     Extraocular Movements: Extraocular movements intact.     Conjunctiva/sclera: Conjunctivae normal.     Pupils: Pupils are equal, round, and reactive to light.  Cardiovascular:     Rate and Rhythm: Normal rate and regular rhythm.     Pulses: Normal pulses.     Heart sounds: Normal heart sounds. No murmur heard.    No friction rub. No gallop.  Pulmonary:     Effort: Pulmonary effort is  normal. No respiratory distress.     Breath sounds: Normal breath sounds. No stridor. No wheezing, rhonchi or rales.  Chest:     Chest wall: No tenderness.  Musculoskeletal:        General: Normal range of motion.     Cervical back: Normal range of motion and neck supple.  Skin:    General: Skin is warm and dry.     Capillary Refill: Capillary refill takes less than 2 seconds.     Coloration: Skin is not jaundiced or pale.     Findings: No bruising, erythema, lesion or rash.  Neurological:     General: No focal deficit  present.     Mental Status: She is alert and oriented to person, place, and time. Mental status is at baseline.  Psychiatric:        Mood and Affect: Mood normal.        Behavior: Behavior normal.        Thought Content: Thought content normal.        Judgment: Judgment normal.     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 Will continue current regimen and recheck in 3 months- if remains as good we will stop jardiance . Call with any concerns. Refills given.       Relevant Medications   lisinopril  (ZESTRIL ) 2.5 MG tablet   atorvastatin  (LIPITOR) 80 MG tablet   empagliflozin  (JARDIANCE ) 25 MG TABS tablet   metFORMIN  (GLUCOPHAGE -XR) 500 MG 24 hr tablet   Semaglutide , 2 MG/DOSE, 8 MG/3ML SOPN   Other Relevant Orders   Bayer DCA Hb A1c Waived (Completed)   CBC with Differential/Platelet   Comprehensive metabolic panel with GFR   Lipid Panel w/o Chol/HDL Ratio   Hypothyroidism   Rechecking labs today. Await results. Treat as needed.       Relevant Orders   TSH   Hyperlipidemia associated with type 2 diabetes mellitus (HCC)   Under good control on current regimen. Continue current regimen. Continue to monitor. Call with any concerns. Refills given. Labs drawn today.        Relevant Medications   lisinopril  (ZESTRIL ) 2.5 MG tablet   atorvastatin  (LIPITOR) 80 MG tablet   empagliflozin  (JARDIANCE ) 25 MG TABS tablet   metFORMIN  (GLUCOPHAGE -XR) 500 MG 24 hr tablet   Semaglutide , 2 MG/DOSE, 8 MG/3ML SOPN     Genitourinary   Benign hypertensive renal disease   Doing great. Will cut her lisinopril  to 2.5mg . recheck 3 months. Call with any  concerns.         Follow up plan: Return in about 3 months (around 08/29/2024).

## 2024-05-29 NOTE — Assessment & Plan Note (Signed)
 Doing great with A1c of 5.7. Will continue current regimen and recheck in 3 months- if remains as good we will stop jardiance . Call with any concerns. Refills given.

## 2024-05-29 NOTE — Assessment & Plan Note (Signed)
 Doing great. Will cut her lisinopril  to 2.5mg . recheck 3 months. Call with any concerns.

## 2024-05-29 NOTE — Assessment & Plan Note (Signed)
 Under good control on current regimen. Continue current regimen. Continue to monitor. Call with any concerns. Refills given. Labs drawn today.

## 2024-05-30 LAB — CBC WITH DIFFERENTIAL/PLATELET
Basophils Absolute: 0 x10E3/uL (ref 0.0–0.2)
Basos: 0 %
EOS (ABSOLUTE): 0.1 x10E3/uL (ref 0.0–0.4)
Eos: 1 %
Hematocrit: 48.3 % — ABNORMAL HIGH (ref 34.0–46.6)
Hemoglobin: 14.9 g/dL (ref 11.1–15.9)
Immature Grans (Abs): 0 x10E3/uL (ref 0.0–0.1)
Immature Granulocytes: 0 %
Lymphocytes Absolute: 3.1 x10E3/uL (ref 0.7–3.1)
Lymphs: 27 %
MCH: 27.2 pg (ref 26.6–33.0)
MCHC: 30.8 g/dL — ABNORMAL LOW (ref 31.5–35.7)
MCV: 88 fL (ref 79–97)
Monocytes Absolute: 0.5 x10E3/uL (ref 0.1–0.9)
Monocytes: 5 %
Neutrophils Absolute: 7.5 x10E3/uL — ABNORMAL HIGH (ref 1.4–7.0)
Neutrophils: 67 %
Platelets: 342 x10E3/uL (ref 150–450)
RBC: 5.48 x10E6/uL — ABNORMAL HIGH (ref 3.77–5.28)
RDW: 12.6 % (ref 11.7–15.4)
WBC: 11.3 x10E3/uL — ABNORMAL HIGH (ref 3.4–10.8)

## 2024-05-30 LAB — COMPREHENSIVE METABOLIC PANEL WITH GFR
ALT: 36 IU/L — ABNORMAL HIGH (ref 0–32)
AST: 24 IU/L (ref 0–40)
Albumin: 4.5 g/dL (ref 3.9–4.9)
Alkaline Phosphatase: 77 IU/L (ref 41–116)
BUN/Creatinine Ratio: 21 (ref 9–23)
BUN: 11 mg/dL (ref 6–20)
Bilirubin Total: 0.9 mg/dL (ref 0.0–1.2)
CO2: 22 mmol/L (ref 20–29)
Calcium: 9.9 mg/dL (ref 8.7–10.2)
Chloride: 102 mmol/L (ref 96–106)
Creatinine, Ser: 0.53 mg/dL — ABNORMAL LOW (ref 0.57–1.00)
Globulin, Total: 2.4 g/dL (ref 1.5–4.5)
Glucose: 99 mg/dL (ref 70–99)
Potassium: 4.4 mmol/L (ref 3.5–5.2)
Sodium: 141 mmol/L (ref 134–144)
Total Protein: 6.9 g/dL (ref 6.0–8.5)
eGFR: 127 mL/min/1.73 (ref >59)

## 2024-05-30 LAB — LIPID PANEL W/O CHOL/HDL RATIO
Cholesterol, Total: 145 mg/dL (ref 100–199)
HDL: 59 mg/dL (ref >39)
LDL Chol Calc (NIH): 65 mg/dL (ref 0–99)
Triglycerides: 118 mg/dL (ref 0–149)
VLDL Cholesterol Cal: 21 mg/dL (ref 5–40)

## 2024-05-30 LAB — TSH: TSH: 2.47 u[IU]/mL (ref 0.450–4.500)

## 2024-06-06 ENCOUNTER — Ambulatory Visit: Payer: Self-pay | Admitting: Family Medicine

## 2024-06-06 MED ORDER — LEVOTHYROXINE SODIUM 25 MCG PO TABS: ORAL_TABLET | ORAL | 3 refills | Status: AC

## 2024-08-29 ENCOUNTER — Ambulatory Visit: Admitting: Family Medicine
